# Patient Record
Sex: Female | Born: 1955
Health system: Southern US, Community
[De-identification: ages and names within clinical notes are randomized; demographics above are authoritative.]

## PROBLEM LIST (undated history)

## (undated) DIAGNOSIS — J3089 Other allergic rhinitis: Secondary | ICD-10-CM

## (undated) DIAGNOSIS — R7303 Prediabetes: Secondary | ICD-10-CM

## (undated) DIAGNOSIS — K219 Gastro-esophageal reflux disease without esophagitis: Secondary | ICD-10-CM

## (undated) DIAGNOSIS — Z87442 Personal history of urinary calculi: Secondary | ICD-10-CM

## (undated) DIAGNOSIS — I10 Essential (primary) hypertension: Secondary | ICD-10-CM

## (undated) DIAGNOSIS — J45909 Unspecified asthma, uncomplicated: Secondary | ICD-10-CM

## (undated) DIAGNOSIS — K760 Fatty (change of) liver, not elsewhere classified: Secondary | ICD-10-CM

## (undated) DIAGNOSIS — Z923 Personal history of irradiation: Secondary | ICD-10-CM

## (undated) DIAGNOSIS — D249 Benign neoplasm of unspecified breast: Secondary | ICD-10-CM

## (undated) DIAGNOSIS — T7840XA Allergy, unspecified, initial encounter: Secondary | ICD-10-CM

## (undated) DIAGNOSIS — C801 Malignant (primary) neoplasm, unspecified: Secondary | ICD-10-CM

## (undated) HISTORY — PX: CYST REMOVAL HAND: SHX6279

## (undated) HISTORY — DX: Allergy, unspecified, initial encounter: T78.40XA

---

## 2004-05-03 ENCOUNTER — Ambulatory Visit (HOSPITAL_COMMUNITY): Admission: RE | Admit: 2004-05-03 | Discharge: 2004-05-03 | Payer: Self-pay

## 2007-04-12 HISTORY — PX: COLONOSCOPY: SHX174

## 2007-10-10 ENCOUNTER — Ambulatory Visit (HOSPITAL_COMMUNITY): Admission: RE | Admit: 2007-10-10 | Discharge: 2007-10-10 | Payer: Self-pay | Admitting: Pulmonary Disease

## 2007-10-30 ENCOUNTER — Ambulatory Visit: Payer: Self-pay | Admitting: Gastroenterology

## 2008-02-05 ENCOUNTER — Ambulatory Visit: Payer: Self-pay | Admitting: Gastroenterology

## 2008-02-27 ENCOUNTER — Ambulatory Visit: Payer: Self-pay | Admitting: Gastroenterology

## 2008-02-27 ENCOUNTER — Ambulatory Visit (HOSPITAL_COMMUNITY): Admission: RE | Admit: 2008-02-27 | Discharge: 2008-02-27 | Payer: Self-pay | Admitting: Gastroenterology

## 2008-07-04 ENCOUNTER — Ambulatory Visit: Payer: Self-pay | Admitting: Gastroenterology

## 2008-07-04 DIAGNOSIS — Z91013 Allergy to seafood: Secondary | ICD-10-CM

## 2008-07-04 DIAGNOSIS — R740 Nonspecific elevation of levels of transaminase and lactic acid dehydrogenase [LDH]: Secondary | ICD-10-CM

## 2008-07-15 LAB — CONVERTED CEMR LAB
AST: 25 units/L (ref 0–37)
Albumin: 4.6 g/dL (ref 3.5–5.2)
Alkaline Phosphatase: 97 units/L (ref 39–117)
Indirect Bilirubin: 0.4 mg/dL (ref 0.0–0.9)
Total Bilirubin: 0.5 mg/dL (ref 0.3–1.2)
Total Protein: 7.9 g/dL (ref 6.0–8.3)

## 2009-01-20 DIAGNOSIS — E785 Hyperlipidemia, unspecified: Secondary | ICD-10-CM

## 2009-01-20 DIAGNOSIS — H1045 Other chronic allergic conjunctivitis: Secondary | ICD-10-CM | POA: Insufficient documentation

## 2009-01-20 DIAGNOSIS — Z8639 Personal history of other endocrine, nutritional and metabolic disease: Secondary | ICD-10-CM | POA: Insufficient documentation

## 2009-01-20 DIAGNOSIS — L719 Rosacea, unspecified: Secondary | ICD-10-CM

## 2009-01-20 DIAGNOSIS — I10 Essential (primary) hypertension: Secondary | ICD-10-CM

## 2009-01-20 DIAGNOSIS — O99810 Abnormal glucose complicating pregnancy: Secondary | ICD-10-CM | POA: Insufficient documentation

## 2009-01-20 DIAGNOSIS — Z862 Personal history of diseases of the blood and blood-forming organs and certain disorders involving the immune mechanism: Secondary | ICD-10-CM

## 2010-08-24 NOTE — H&P (Signed)
NAMEONIE, KASPAREK             ACCOUNT NO.:  [id number]   MEDICAL RECORD NO.:  [id number]          PATIENT TYPE:  AMB   LOCATION:  DAY                           FACILITY:  [facility]   PHYSICIAN:  Kassie Mends, M.D.      DATE OF BIRTH:  Mar 25, 1956   DATE OF ADMISSION:  DATE OF DISCHARGE:  LH                              HISTORY & PHYSICAL   REFERRING Tanish Sinkler:  Oneal Deputy. Juanetta Gosling, MD   PROBLEM LIST:  1. Elevated liver enzymes.  2. Hypertension.  3. 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   SUBJECTIVE:  Susan Christensen is a 55 year old female who was initially seen in  July 2009 due to elevated liver enzymes.  At that time, she had been on  Vytorin for 4-5 years.  She had been off Vytorin for 2 months.  Her body  mass index was 29.5.  She had an ultrasound in July 2009, which showed  fatty infiltration of the liver.  The ultrasound was ordered after liver  enzymes revealed an ALT of 60 (0-35) and an AST of 44 (0-37).  Her alk  phos and her total bilirubin were normal.  We repeated her liver enzymes  in July 2009 as well as ferritin, and the liver profile was essentially  unremarkable except for an ALT of 47 (0-35).  Her ferritin was 30, which  is within normal limits.  She did have viral hepatitis serology to  include hepatitis A antibody IgM, hepatic B foreign body IgM, hepatitis  B surface antigen, and hepatitis C antibody, which were negative in July  2009.   She has no particular questions, concerns, or complaints.  Her home  scale reports that she lost 10 pounds.  Our scale is a 3 pounds.  She  denies any itching.  Her appetite is good.  She has not had any episodes  of  yellow eyes or yellow skin.  She has no constipation or abdominal  pain.  She never had a colonoscopy.   FAMILY HISTORY:  No colon cancer or colon polyps.   ALLERGIES:  TRIBULIN and SHELLFISH.   MEDICATIONS AT HOME:  1. Lotrel 10/20.  2. Claritin.  3. Metronidazole cream.  4. ProAir.  5. Albuterol.   PHYSICAL  EXAMINATION:  GENERAL:  Weight 153 pounds, height 5 feet 1  inch, and BMI 28.9 (overweight). VITAL SIGNS:  Temperature 98.5, blood  pressure 120/60, and pulse 92.GENERAL:  She is in no apparent distress.  Alert and oriented x4.HEENT:  Atraumatic and normocephalic.  Pupils are  equal and reactive and light.  Mouth no oral lesions.  Posterior  oropharynx without erythema or exudate.LUNGS:  Clear to auscultation  bilaterally.CARDIOVASCULAR:  Regular rhythm.  No murmur.  Normal S1 and  S2.ABDOMEN:  Bowel sounds are present.  Soft, nontender, and  nondistended.  No rebound or guarding. NEURO:  She has no focal  neurologic deficit.   LABORATORY DATA:  In September 2009, white count 7.7, hemoglobin 13.1,  platelet 383; total bili 0.5, alk phos 83,  AST 27, ALT 43 (0-35), and  albumin 4.5.   ASSESSMENT:  [name] is a  55 year old female who had mildly elevated  liver enzymes.  It was predominantly transaminitis.  The differential  diagnosis includes nonalcoholic steatohepatitis, Vytorin side effect,  and a low likelihood of autoimmune hepatitis. Thank you for allowing me  to see Ms. Gannett in consultation.  My recommendations follow.   RECOMMENDATIONS:  1. She should follow a low-fat diet.  I gave her a handout.  She is      encouraged to lose 10 more pounds, and I believe her liver enzymes      will normalized.  2. She should be scheduled for colonoscopy in November 2009 with a      MiraLax bowel prep.  3. Will obtain quantitive immunoglobulin, ANA, and anti-smooth muscle      antibody today.  4. She has a follow up appointment to see me in March 2010.  Dr.      Juanetta Gosling could consider restarting her a lipid-lowering agent in      another category due to the possibility that Vytorin may have      caused mildly elevated liver enzymes.      Kassie Mends, M.D.  Electronically Signed     SM/MEDQ  D:  02/05/2008  T:  02/05/2008  Job:  [id number]   cc:   Ramon Dredge L. Juanetta Gosling, M.D.  Fax:  [phone]

## 2010-08-24 NOTE — Op Note (Signed)
NAME:  Susan Christensen, Susan Christensen             ACCOUNT NO.:  [id number]   MEDICAL RECORD NO.:  [id number]          PATIENT TYPE:  AMB   LOCATION:  DAY                           FACILITY:  [facility]   PHYSICIAN:  Kassie Mends, M.D.      DATE OF BIRTH:  12-29-1955   DATE OF PROCEDURE:  DATE OF DISCHARGE:                                PROCEDURE NOTE   PROCEDURE:  Colonoscopy.   REFERRING PHYSICIAN:  Edward L. Juanetta Gosling, MD   INDICATION FOR EXAM:  Ms. Oats is a 55 year old female who presents for  average-risk colon cancer screening.   FINDINGS:  Small moderate internal hemorrhoids.  Otherwise, normal colon  without evidence of polyps, mass, inflammatory changes, diverticuli, or  AVMs.   RECOMMENDATIONS:  1. Screening colonoscopy in 10 years.  2. She should follow a high-fiber diet.  She was given a handout on      high-fiber diet and hemorrhoids.   MEDICATIONS:  1. Demerol 75 mg IV.  2. Versed 4 mg IV.   PROCEDURE TECHNIQUE:  Physical exam was performed.  Informed consent was  obtained from the patient after explaining the benefits, risks, and  alternatives to the procedure.  The patient was connected to the monitor  placed in left lateral position.  Continuous oxygen was provided by  nasal cannula and IV medicine administered through an indwelling  cannula.  After administration of sedation and rectal exam, the  patient's rectum was intubated and the scope was advanced under direct  visualization to the cecum.  The scope was removed slowly by carefully  examining the color, texture, anatomy, and integrity of the mucosa on  the way out.  The patient was recovered in endoscopy and discharged home  in satisfactory condition.      Kassie Mends, M.D.  Electronically Signed    SM/MEDQ  D:  02/27/2008  T:  02/27/2008  Job:  [id number]   cc:   Ramon Dredge L. Juanetta Gosling, M.D.  Fax: [phone]

## 2010-08-24 NOTE — Assessment & Plan Note (Signed)
Susan Christensen, Susan Christensen              CHART#:  [id number]   DATE:  10/30/2007                       DOB:  07-02-1955   REASON FOR CONSULTATION:  Abnormal LFTs.   PHYSICIAN REQUESTING CONSULTATION:  Edward L. Juanetta Gosling, MD   HISTORY OF PRESENT ILLNESS:  The patient is a 55 year old Caucasian  female patient of Dr. Kari Baars who presents today for further  evaluation of abnormal LFTs.  The patient states this was initially  discovered about 2 months ago.  Since that time, her Vytorin has been  held.  Her numbers were rechecked recently, but still remained elevated.  Therefore, she has been sent for consultation.  Her last LFTs were on  October 04, 2007.  Her AST was 44.  Her ALT was 60.  Her albumin 4.3, total  bilirubin 0.5, and alkaline phosphatase 83.  She was negative for  hepatitis B surface antigen and hepatitis C antibody.  She had abdominal  ultrasound, which revealed probable fatty liver.  She states that she  has been on Vytorin for over 5 years with no problems with her LFTs.  She denies any weight gain or weight loss from last one year.  She feels  well.  Denies any abdominal pain, nausea, vomiting, heartburn,  dysphagia, odynophagia, constipation, diarrhea, melena, or rectal  bleeding.   CURRENT MEDICATIONS:  1. Lotrel 10/20 mg daily.  2. Claritin 10 mg daily.  3. Metronidazole cream daily.   ALLERGIES:  Shellfish causes anaphylactic reactions and tributalane.   PAST MEDICAL HISTORY:  Seasonal allergies, hypertension, rosacea,  hyperlipidemia, and gestational diabetes.   PAST SURGICAL HISTORY:  Cholecystectomy, cyst removal, and fatty tumor  removed from her shoulder.  No prior colonoscopy or EGD.   FAMILY HISTORY:  Negative for colorectal cancer, chronic GI illnesses,  or liver disease.  She had a sister who passed away with Down syndrome.  Mother has hypertension.  Father deceased at age 76 of MI.   SOCIAL HISTORY:  She is married.  She has 1 child.  She is  unemployed.  She has never been a smoker, rarely consumes alcohol and never a regular  alcohol user.  No history of illicit drug use, tattoos, or blood  transfusions.   REVIEW OF SYSTEMS:  See HPI for GI.  Constitutional:  Denies any recent  weight loss or weight gain in the last one year, but admits that she has  had a gradual weight gain since the birth of her child.  Cardiopulmonary:  Denies chest pain, shortness of breath, palpitations,  or cough.  Genitourinary:  Denies dysuria or hematuria.   PHYSICAL EXAMINATION:  VITAL SIGNS:  Weight 156 and height 5 feet 1  inches.  BMI is 29.5, classified as overweight.  Temp 98.8, blood  pressure 132/88, and pulse 92.  GENERAL:  Pleasant mildly obese Caucasian female in no acute distress.  SKIN:  Warm and dry.  No jaundice.  HEENT:  Sclerae nonicteric.  Oropharyngeal mucosa moist and pink.  No  lesions, erythema, or exudate.  No lymphadenopathy or thyromegaly.  CHEST:  Lungs are clear to auscultation.  CARDIAC:  Regular rate and rhythm.  Normal S1 and S2.  No murmurs, rubs,  or gallops.  ABDOMEN:  Positive bowel sounds.  Abdomen is mildly obese, soft, and  nontender.  No organomegaly or masses.  No rebound  or guarding.  No  abdominal bruits or hernias.  LOWER EXTREMITIES:  No edema.   LABORATORY DATA:  As above.   IMPRESSION:  The patient is a pleasant 55 year old lady who is mildly  obese and has a hyperlipidemia who presents with recent onset of mildly  elevated transaminases.  This was noted in the setting of Vytorin use.  However, it has not improved since medication has been stopped 2 months  ago.  Notably, the patient had been on Vytorin for over 5 years without  any problems.  She denies any weight gain in the last one year.  She has  had some chronic weight gain, however, the past several years.  Ultrasound is suggestive of fatty liver and I suspect she does have  steatohepatitis.  We talked about fatty liver disease today.  I   recommended that she pursue walking 3 and 4 times a week at least 20-30  minutes at a time.  I recommend a 5-10 pound weight loss over the next 8  weeks.  She is also of screening age for colorectal cancer and I have  recommended that she pursue colonoscopy sometime this year.   PLAN:  1. Iron and TIBC, ferritin, and liver function tests.  2. Fatty liver information provided to the patient.  3. Aerobic exercise consisting of 20-30 minutes of walking at least 4      times weekly.  4. A 5-10 pound weight loss over the next 8 weeks.  5. Colonoscopy this year.  The patient will schedule when she is      ready.  6. Further recommendations to follow.  7. The patient did enquire about the possibility of using Omega-3      fatty acids, but has an allergy to shellfish and some other fish,      and I advised her to discuss this with the pharmacist or Dr.      Juanetta Gosling before pursuing.  8. Further recommendations to follow.   I would like to thank Dr. Kari Baars for allowing [place] to take part in  the care of this patient.       Tana Coast, P.A.  Electronically Signed     Kassie Mends, M.D.  Electronically Signed    LL/MEDQ  D:  10/30/2007  T:  10/31/2007  Job:  [id number]   cc:   Ramon Dredge L. Juanetta Gosling, M.D.

## 2013-01-31 ENCOUNTER — Encounter (HOSPITAL_COMMUNITY): Payer: Self-pay | Admitting: Emergency Medicine

## 2013-01-31 ENCOUNTER — Emergency Department (HOSPITAL_COMMUNITY)
Admission: EM | Admit: 2013-01-31 | Discharge: 2013-01-31 | Disposition: A | Discharge status: Home / Self Care | Payer: No Typology Code available for payment source | Attending: Emergency Medicine | Admitting: Emergency Medicine

## 2013-01-31 DIAGNOSIS — Y9389 Activity, other specified: Secondary | ICD-10-CM | POA: Insufficient documentation

## 2013-01-31 DIAGNOSIS — I10 Essential (primary) hypertension: Secondary | ICD-10-CM | POA: Insufficient documentation

## 2013-01-31 DIAGNOSIS — Y92410 Unspecified street and highway as the place of occurrence of the external cause: Secondary | ICD-10-CM | POA: Insufficient documentation

## 2013-01-31 DIAGNOSIS — J45901 Unspecified asthma with (acute) exacerbation: Secondary | ICD-10-CM | POA: Insufficient documentation

## 2013-01-31 DIAGNOSIS — S8990XA Unspecified injury of unspecified lower leg, initial encounter: Secondary | ICD-10-CM | POA: Insufficient documentation

## 2013-01-31 HISTORY — DX: Unspecified asthma, uncomplicated: J45.909

## 2013-01-31 HISTORY — DX: Essential (primary) hypertension: I10

## 2013-01-31 NOTE — Discharge Instructions (Signed)
Tylenol or motrin for pain.  Follow up with your md next week as needed
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
## 2013-01-31 NOTE — ED Notes (Signed)
Pt removed from backboard, denies pain, no point tenderness.

## 2013-01-31 NOTE — ED Notes (Signed)
Pt resting in chair awaiting disposition. Provided with cola per request

## 2014-02-10 ENCOUNTER — Encounter (HOSPITAL_COMMUNITY): Payer: Self-pay | Admitting: Emergency Medicine

## 2015-12-27 ENCOUNTER — Emergency Department (HOSPITAL_COMMUNITY): Payer: BLUE CROSS/BLUE SHIELD

## 2015-12-27 ENCOUNTER — Encounter (HOSPITAL_COMMUNITY): Payer: Self-pay | Admitting: Emergency Medicine

## 2015-12-27 ENCOUNTER — Emergency Department (HOSPITAL_COMMUNITY)
Admission: EM | Admit: 2015-12-27 | Discharge: 2015-12-28 | Disposition: A | Discharge status: Home / Self Care | Payer: BLUE CROSS/BLUE SHIELD | Attending: Emergency Medicine | Admitting: Emergency Medicine

## 2015-12-27 DIAGNOSIS — I10 Essential (primary) hypertension: Secondary | ICD-10-CM | POA: Insufficient documentation

## 2015-12-27 DIAGNOSIS — R0602 Shortness of breath: Secondary | ICD-10-CM | POA: Diagnosis not present

## 2015-12-27 DIAGNOSIS — Z79899 Other long term (current) drug therapy: Secondary | ICD-10-CM | POA: Diagnosis not present

## 2015-12-27 DIAGNOSIS — J45901 Unspecified asthma with (acute) exacerbation: Secondary | ICD-10-CM | POA: Diagnosis not present

## 2015-12-27 DIAGNOSIS — R05 Cough: Secondary | ICD-10-CM | POA: Diagnosis not present

## 2015-12-27 LAB — CBC WITH DIFFERENTIAL/PLATELET
BASOS PCT: 1 %
Basophils Absolute: 0.1 10*3/uL (ref 0.0–0.1)
EOS ABS: 0.6 10*3/uL (ref 0.0–0.7)
Eosinophils Relative: 6 %
HCT: 46.4 % — ABNORMAL HIGH (ref 36.0–46.0)
HEMOGLOBIN: 14.9 g/dL (ref 12.0–15.0)
LYMPHS ABS: 2.5 10*3/uL (ref 0.7–4.0)
Lymphocytes Relative: 25 %
MCH: 29.9 pg (ref 26.0–34.0)
MCHC: 32.1 g/dL (ref 30.0–36.0)
MCV: 93 fL (ref 78.0–100.0)
Monocytes Absolute: 1.2 10*3/uL — ABNORMAL HIGH (ref 0.1–1.0)
Monocytes Relative: 12 %
NEUTROS PCT: 56 %
Neutro Abs: 5.5 10*3/uL (ref 1.7–7.7)
Platelets: 313 10*3/uL (ref 150–400)
RBC: 4.99 MIL/uL (ref 3.87–5.11)
RDW: 12.8 % (ref 11.5–15.5)
WBC: 9.8 10*3/uL (ref 4.0–10.5)

## 2015-12-27 LAB — BASIC METABOLIC PANEL
Anion gap: 12 (ref 5–15)
BUN: 16 mg/dL (ref 6–20)
CHLORIDE: 100 mmol/L — AB (ref 101–111)
CO2: 24 mmol/L (ref 22–32)
CREATININE: 1.02 mg/dL — AB (ref 0.44–1.00)
Calcium: 9.1 mg/dL (ref 8.9–10.3)
GFR calc non Af Amer: 59 mL/min — ABNORMAL LOW (ref >60)
Glucose, Bld: 152 mg/dL — ABNORMAL HIGH (ref 65–99)
Potassium: 3.8 mmol/L (ref 3.5–5.1)
SODIUM: 136 mmol/L (ref 135–145)

## 2015-12-27 MED ORDER — METHYLPREDNISOLONE SODIUM SUCC 125 MG IJ SOLR
125.0000 mg | Freq: Once | INTRAMUSCULAR | Status: AC
  Administered 2015-12-27: 125 mg via INTRAVENOUS
  Filled 2015-12-27: qty 2

## 2015-12-27 MED ORDER — IPRATROPIUM-ALBUTEROL 0.5-2.5 (3) MG/3ML IN SOLN
3.0000 mL | Freq: Once | RESPIRATORY_TRACT | Status: AC
  Administered 2015-12-27: 3 mL via RESPIRATORY_TRACT
  Filled 2015-12-27: qty 3

## 2015-12-27 MED ORDER — ALBUTEROL SULFATE (2.5 MG/3ML) 0.083% IN NEBU
5.0000 mg | INHALATION_SOLUTION | Freq: Once | RESPIRATORY_TRACT | Status: AC
  Administered 2015-12-27: 5 mg via RESPIRATORY_TRACT
  Filled 2015-12-27: qty 6

## 2015-12-27 NOTE — ED Triage Notes (Signed)
Pt states she has been short of breath for past 3 days but more so today. Pt states she has Asthma and has used her inhaler 3 times in last 30 minutes.

## 2015-12-27 NOTE — ED Provider Notes (Signed)
[facility] DEPT Provider Note   CSN: [id number] Arrival date & time: 12/27/15  2211  By signing my name below, I, [name], attest that this documentation has been prepared under the direction and in the presence of Delora Fuel, MD. Electronically Signed: [name], ED Scribe. 12/27/15. 12:03 AM.   History   Chief Complaint Chief Complaint  Patient presents with  . Shortness of Breath   The history is provided by the patient. No language interpreter was used.    HPI Comments: Susan Christensen is a 60 y.o. female with PMHx of Asthma and HTN who presents to the Emergency Department complaining of gradual onset, constant shortness of breath onset 3 days. Pt states she believes she is having a "rough asthma attack". She has used her inhaler at home today with no relief to symptoms. Pt states she is feeling much better after breathing treatment, but still feels as if she is wheezing. Pt has been taking Mucinex for the last few days. Pt had similar symptoms 1 year ago and was prescribed Sudafed which relieved her symptoms. Pt reports associated cough, diaphoresis, chest congestion, wheezing. She states she has never had to been admitted to the hospital for her asthma. Denies chills, nausea, vomiting, fever.   Past Medical History:  Diagnosis Date  . Asthma   . Hypertension     Patient Active Problem List   Diagnosis Date Noted  . HYPERLIPIDEMIA 01/20/2009  . OTHER CHRONIC ALLERGIC CONJUNCTIVITIS 01/20/2009  . HYPERTENSION 01/20/2009  . GESTATIONAL DIABETES 01/20/2009  . ROSACEA 01/20/2009  . LIVER FUNCTION TESTS, ABNORMAL, HX OF 01/20/2009  . ABNORMAL TRANSAMINASE, (LFT'S) 07/04/2008  . [place] ALLERGY 07/04/2008    Past Surgical History:  Procedure Laterality Date  . CESAREAN SECTION    . CYST REMOVAL HAND Left     OB History    Gravida Para Term Preterm AB Living   1 1 1     1    SAB TAB Ectopic Multiple Live Births                   Home Medications     Prior to Admission medications   Medication Sig Start Date End Date Taking? Authorizing Provider  albuterol (VENTOLIN HFA) 108 (90 BASE) MCG/ACT inhaler Inhale 2 puffs into the lungs every 6 (six) hours as needed for wheezing or shortness of breath.   Yes Historical Provider, MD  amLODipine (NORVASC) 10 MG tablet Take 10 mg by mouth daily.   Yes Historical Provider, MD  benazepril (LOTENSIN) 20 MG tablet Take 20 mg by mouth daily.   Yes Historical Provider, MD  cholecalciferol (VITAMIN D) 1000 UNITS tablet Take 1,000 Units by mouth daily.   Yes Historical Provider, MD  Dextromethorphan-Guaifenesin (MUCUS RELIEF DM MAX) 5-100 MG/5ML LIQD Take by mouth every 4 (four) hours as needed (for cold/congestion symptoms).   Yes Historical Provider, MD  Phenylephrine-DM-GG-APAP (EQL MUCUS RELIEF MAX STRENGTH) 5-10-200-325 MG TABS Take 1 tablet by mouth every 12 (twelve) hours as needed (for congestion/cold relief).   Yes Historical Provider, MD    Family History Family History  Problem Relation Age of Onset  . Heart attack Father     Social History Social History  Substance Use Topics  . Smoking status: Never Smoker  . Smokeless tobacco: Never Used  . Alcohol use No     Allergies   Shellfish allergy   Review of Systems Review of Systems  Constitutional: Positive for diaphoresis. Negative for chills and fever.  HENT: Positive for congestion.   Respiratory: Positive for cough, shortness of breath and wheezing.   Gastrointestinal: Negative for nausea and vomiting.  All other systems reviewed and are negative.   Physical Exam Updated Vital Signs BP 155/86   Pulse (!) 127   Temp 98.6 F (37 C) (Oral)   Resp (!) 27   Ht 5\' 1"  (1.549 m)   Wt 157 lb (71.2 kg)   SpO2 96%   BMI 29.66 kg/m   Physical Exam  Constitutional: She is oriented to person, place, and time. She appears well-developed and well-nourished.  HENT:  Head: Normocephalic and atraumatic.  Eyes: EOM are normal.  Pupils are equal, round, and reactive to light.  Neck: Normal range of motion. Neck supple. No JVD present.  Cardiovascular: Regular rhythm and normal heart sounds.  Tachycardia present.   No murmur heard. Pulmonary/Chest: Effort normal. She has wheezes. She has no rales. She exhibits no tenderness.  Lungs diffuse inspiratory and expiratory wheezes  Abdominal: Soft. Bowel sounds are normal. She exhibits no distension and no mass. There is no tenderness.  Musculoskeletal: Normal range of motion. She exhibits no edema.  Trace pretibial edema  Lymphadenopathy:    She has no cervical adenopathy.  Neurological: She is alert and oriented to person, place, and time. No cranial nerve deficit. She exhibits normal muscle tone. Coordination normal.  Skin: Skin is warm and dry. No rash noted.  Psychiatric: She has a normal mood and affect. Her behavior is normal. Judgment and thought content normal.  Nursing note and vitals reviewed.   ED Treatments / Results  DIAGNOSTIC STUDIES: Oxygen Saturation is 92% on RA, low by my interpretation.    COORDINATION OF CARE: 11:16 PM Discussed treatment plan with pt at bedside which includes breathing treatment and steroid and pt agreed to plan.  Labs (all labs ordered are listed, but only abnormal results are displayed) Labs Reviewed  BASIC METABOLIC PANEL - Abnormal; Notable for the following:       Result Value   Chloride 100 (*)    Glucose, Bld 152 (*)    Creatinine, Ser 1.02 (*)    GFR calc non Af Amer 59 (*)    All other components within normal limits  CBC WITH DIFFERENTIAL/PLATELET - Abnormal; Notable for the following:    HCT 46.4 (*)    Monocytes Absolute 1.2 (*)    All other components within normal limits    EKG  EKG Interpretation  Date/Time:  Sunday December 27 2015 22:22:45 EDT Ventricular Rate:  126 PR Interval:    QRS Duration: 139 QT Interval:  314 QTC Calculation: 455 R Axis:   -4 Text Interpretation:  Sinus tachycardia  Left bundle branch block Baseline wander in lead(s) I II III aVR aVL aVF V1 No old tracing to compare Confirmed by [facility]  MD, Madicyn Mesina ([id number]) on 12/27/2015 10:50:32 PM       Radiology Dg Chest 2 View  Result Date: 12/27/2015 CLINICAL DATA:  Subacute onset of shortness of breath and cough. Initial encounter. EXAM: CHEST  2 VIEW COMPARISON:  None. FINDINGS: The lungs are well-aerated. Mild vascular congestion is noted, with mild bibasilar atelectasis. There is no evidence of pleural effusion or pneumothorax. The heart is normal in size; the mediastinal contour is within normal limits. No acute osseous abnormalities are seen. IMPRESSION: Mild vascular congestion, with mild bibasilar atelectasis. Electronically Signed   By: Garald Balding M.D.   On: 12/27/2015 23:45    Procedures Procedures (including critical  care time)  Medications Ordered in ED Medications  albuterol (PROVENTIL) (2.5 MG/3ML) 0.083% nebulizer solution 5 mg (5 mg Nebulization Given 12/27/15 2234)  ipratropium-albuterol (DUONEB) 0.5-2.5 (3) MG/3ML nebulizer solution 3 mL (3 mLs Nebulization Given 12/27/15 2340)  methylPREDNISolone sodium succinate (SOLU-MEDROL) 125 mg/2 mL injection 125 mg (125 mg Intravenous Given 12/27/15 2337)     Initial Impression / Assessment and Plan / ED Course  I have reviewed the triage vital signs and the nursing notes.  Pertinent labs & imaging results that were available during my care of the patient were reviewed by me and considered in my medical decision making (see chart for details).  Clinical Course   Asthma exacerbation. Patient was seen after receiving nebulizer treatment with albuterol which was still having significant wheezing. She was sent for chest x-ray which showed no evidence of pneumonia. She was given a dose of methylprednisolone and given an albuterol with ipratropium nebulizer treatment with complete resolution of wheezing. On reexam, lungs were completely clear and patient stated  she was back to her baseline. She is discharged with prescription for prednisone and is getting to using her albuterol inhaler. Old records are reviewed, and she has no relevant past visits.   Final Clinical Impressions(s) / ED Diagnoses   Final diagnoses:  Asthma exacerbation   I personally performed the services described in this documentation, which was scribed in my presence. The recorded information has been reviewed and is accurate.   New Prescriptions New Prescriptions   PREDNISONE (DELTASONE) 50 MG TABLET    Take 1 tablet (50 mg total) by mouth daily.     Delora Fuel, MD [id number]

## 2015-12-28 MED ORDER — PREDNISONE 50 MG PO TABS: 50.0000 mg | ORAL_TABLET | Freq: Every day | ORAL | 0 refills | Status: DC

## 2015-12-29 ENCOUNTER — Other Ambulatory Visit (HOSPITAL_COMMUNITY): Payer: Self-pay | Admitting: Respiratory Therapy

## 2015-12-29 DIAGNOSIS — J441 Chronic obstructive pulmonary disease with (acute) exacerbation: Secondary | ICD-10-CM

## 2015-12-29 DIAGNOSIS — J45901 Unspecified asthma with (acute) exacerbation: Secondary | ICD-10-CM | POA: Diagnosis not present

## 2016-01-06 ENCOUNTER — Ambulatory Visit (HOSPITAL_COMMUNITY)
Admission: RE | Admit: 2016-01-06 | Discharge: 2016-01-06 | Disposition: A | Discharge status: Home / Self Care | Payer: BLUE CROSS/BLUE SHIELD | Source: Ambulatory Visit | Attending: Pulmonary Disease | Admitting: Pulmonary Disease

## 2016-01-06 DIAGNOSIS — J441 Chronic obstructive pulmonary disease with (acute) exacerbation: Secondary | ICD-10-CM | POA: Insufficient documentation

## 2016-01-06 MED ORDER — ALBUTEROL SULFATE (2.5 MG/3ML) 0.083% IN NEBU
2.5000 mg | INHALATION_SOLUTION | Freq: Once | RESPIRATORY_TRACT | Status: AC
  Administered 2016-01-06: 2.5 mg via RESPIRATORY_TRACT

## 2016-01-11 LAB — PULMONARY FUNCTION TEST
DL/VA % PRED: 121 %
DL/VA: 5.35 ml/min/mmHg/L
DLCO COR % PRED: 104 %
DLCO UNC: 21.98 ml/min/mmHg
DLCO cor: 21.06 ml/min/mmHg
DLCO unc % pred: 108 %
FEF 25-75 POST: 1.22 L/s
FEF 25-75 Pre: 0.82 L/sec
FEF2575-%CHANGE-POST: 48 %
FEF2575-%PRED-POST: 55 %
FEF2575-%Pred-Pre: 37 %
FEV1-%CHANGE-POST: 13 %
FEV1-%PRED-POST: 74 %
FEV1-%Pred-Pre: 65 %
FEV1-Post: 1.7 L
FEV1-Pre: 1.5 L
FEV1FVC-%Change-Post: 7 %
FEV1FVC-%PRED-PRE: 80 %
FEV6-%Change-Post: 6 %
FEV6-%Pred-Post: 88 %
FEV6-%Pred-Pre: 83 %
FEV6-PRE: 2.36 L
FEV6-Post: 2.51 L
FEV6FVC-%Change-Post: 0 %
FEV6FVC-%PRED-PRE: 103 %
FEV6FVC-%Pred-Post: 104 %
FVC-%CHANGE-POST: 5 %
FVC-%PRED-POST: 84 %
FVC-%PRED-PRE: 80 %
FVC-POST: 2.51 L
FVC-PRE: 2.37 L
POST FEV1/FVC RATIO: 68 %
PRE FEV6/FVC RATIO: 100 %
Post FEV6/FVC ratio: 100 %
Pre FEV1/FVC ratio: 63 %
RV % pred: 108 %
RV: 2 L
TLC % pred: 96 %
TLC: 4.43 L

## 2016-02-04 DIAGNOSIS — I10 Essential (primary) hypertension: Secondary | ICD-10-CM | POA: Diagnosis not present

## 2016-02-04 DIAGNOSIS — J45901 Unspecified asthma with (acute) exacerbation: Secondary | ICD-10-CM | POA: Diagnosis not present

## 2016-02-04 DIAGNOSIS — J309 Allergic rhinitis, unspecified: Secondary | ICD-10-CM | POA: Diagnosis not present

## 2016-05-25 DIAGNOSIS — J309 Allergic rhinitis, unspecified: Secondary | ICD-10-CM | POA: Diagnosis not present

## 2016-05-25 DIAGNOSIS — I10 Essential (primary) hypertension: Secondary | ICD-10-CM | POA: Diagnosis not present

## 2016-05-25 DIAGNOSIS — J4530 Mild persistent asthma, uncomplicated: Secondary | ICD-10-CM | POA: Diagnosis not present

## 2016-05-25 DIAGNOSIS — K760 Fatty (change of) liver, not elsewhere classified: Secondary | ICD-10-CM | POA: Diagnosis not present

## 2016-05-26 ENCOUNTER — Other Ambulatory Visit (HOSPITAL_COMMUNITY): Payer: Self-pay | Admitting: Pulmonary Disease

## 2016-05-26 DIAGNOSIS — K760 Fatty (change of) liver, not elsewhere classified: Secondary | ICD-10-CM

## 2016-05-28 DIAGNOSIS — E785 Hyperlipidemia, unspecified: Secondary | ICD-10-CM | POA: Diagnosis not present

## 2016-05-28 DIAGNOSIS — I10 Essential (primary) hypertension: Secondary | ICD-10-CM | POA: Diagnosis not present

## 2016-05-28 DIAGNOSIS — J4530 Mild persistent asthma, uncomplicated: Secondary | ICD-10-CM | POA: Diagnosis not present

## 2016-05-28 DIAGNOSIS — J309 Allergic rhinitis, unspecified: Secondary | ICD-10-CM | POA: Diagnosis not present

## 2016-06-03 ENCOUNTER — Ambulatory Visit (HOSPITAL_COMMUNITY): Payer: No Typology Code available for payment source

## 2016-06-13 ENCOUNTER — Ambulatory Visit (HOSPITAL_COMMUNITY)
Admission: RE | Admit: 2016-06-13 | Discharge: 2016-06-13 | Disposition: A | Discharge status: Home / Self Care | Payer: BLUE CROSS/BLUE SHIELD | Source: Ambulatory Visit | Attending: Pulmonary Disease | Admitting: Pulmonary Disease

## 2016-06-13 DIAGNOSIS — K769 Liver disease, unspecified: Secondary | ICD-10-CM | POA: Diagnosis not present

## 2016-06-13 DIAGNOSIS — K760 Fatty (change of) liver, not elsewhere classified: Secondary | ICD-10-CM | POA: Diagnosis not present

## 2016-06-23 ENCOUNTER — Encounter: Payer: Self-pay | Admitting: Gastroenterology

## 2016-07-15 ENCOUNTER — Ambulatory Visit: Payer: BLUE CROSS/BLUE SHIELD | Admitting: Gastroenterology

## 2016-08-10 ENCOUNTER — Encounter: Payer: Self-pay | Admitting: Gastroenterology

## 2016-08-10 ENCOUNTER — Ambulatory Visit (INDEPENDENT_AMBULATORY_CARE_PROVIDER_SITE_OTHER): Payer: BLUE CROSS/BLUE SHIELD | Admitting: Gastroenterology

## 2016-08-10 DIAGNOSIS — K760 Fatty (change of) liver, not elsewhere classified: Secondary | ICD-10-CM | POA: Diagnosis not present

## 2016-08-10 NOTE — Patient Instructions (Addendum)
Continue your activity as you are doing!  We will recheck liver numbers in 6 months. Let's set a goal for 10 lbs weight loss in 6 months.  Drink 2-3 cups of coffee a day.   Nonalcoholic Fatty Liver Disease Diet Nonalcoholic fatty liver disease is a condition that causes fat to accumulate in and around the liver. The disease makes it harder for the liver to work the way that it should. Following a healthy diet can help to keep nonalcoholic fatty liver disease under control. It can also help to prevent or improve conditions that are associated with the disease, such as heart disease, diabetes, high blood pressure, and abnormal cholesterol levels. Along with regular exercise, this diet:  Promotes weight loss.  Helps to control blood sugar levels.  Helps to improve the way that the body uses insulin. What do I need to know about this diet?  Use the glycemic index (GI) to plan your meals. The index tells you how quickly a food will raise your blood sugar. Choose low-GI foods. These foods take a longer time to raise blood sugar.  Keep track of how many calories you take in. Eating the right amount of calories will help you to achieve a healthy weight.  You may want to follow a Mediterranean diet. This diet includes a lot of vegetables, lean meats or fish, whole grains, fruits, and healthy oils and fats. What foods can I eat? Grains  Whole grains, such as whole-wheat or whole-grain breads, crackers, tortillas, cereals, and pasta. Stone-ground whole wheat. Pumpernickel bread. Unsweetened oatmeal. Bulgur. Barley. Quinoa. Brown or wild rice. Corn or whole-wheat flour tortillas. Vegetables  Lettuce. Spinach. Peas. Beets. Cauliflower. Cabbage. Broccoli. Carrots. Tomatoes. Squash. Eggplant. Herbs. Peppers. Onions. Cucumbers. Brussels sprouts. Yams and sweet potatoes. Beans. Lentils. Fruits  Bananas. Apples. Oranges. Grapes. Papaya. Mango. Pomegranate. Kiwi. Grapefruit. Cherries. Meats and Other  Protein Sources  Seafood and shellfish. Lean meats. Poultry. Tofu. Dairy  Low-fat or fat-free dairy products, such as yogurt, cottage cheese, and cheese. Beverages  Water. Sugar-free drinks. Tea. Coffee. Low-fat or skim milk. Milk alternatives, such as soy or almond milk. Real fruit juice. Condiments  Mustard. Relish. Low-fat, low-sugar ketchup and barbecue sauce. Low-fat or fat-free mayonnaise. Sweets and Desserts  Sugar-free sweets. Fats and Oils  Avocado. Canola or olive oil. Nuts and nut butters. Seeds. The items listed above may not be a complete list of recommended foods or beverages. Contact your dietitian for more options.  What foods are not recommended? Palm oil and coconut oil. Processed foods. Fried foods. Sweetened drinks, such as sweet tea, milkshakes, snow cones, iced sweet drinks, and sodas. Alcohol. Sweets. Foods that contain a lot of salt or sodium. The items listed above may not be a complete list of foods and beverages to avoid. Contact your dietitian for more information.  This information is not intended to replace advice given to you by your health care provider. Make sure you discuss any questions you have with your health care provider. Document Released: 08/12/2014 Document Revised: 09/03/2015 Document Reviewed: 04/22/2014 Elsevier Interactive Patient Education  2017 [employer].

## 2016-08-10 NOTE — Assessment & Plan Note (Signed)
61 year old female with fatty liver, mildly elevated ALT with otherwise normal LFTs. Platelets normal late last year. Hep C negative antibody on file. Discussed dietary and behavior modifications, with a goal of losing 10 lbs by time she is seen again in 6 months. Will continue to follow her closely. Consider elastography if any bump in LFTs, along with serologies. For now, discussed multi-faceted approach for addressing fatty liver. As of note, appreciated a murmur on exam. She is asymptomatic and will mention this to Dr. Luan Pulling at next visit, which she states is upcoming soon. Will see her again in 6 months and recheck LFTs at that time. Colonoscopy in 2019.

## 2016-08-10 NOTE — Progress Notes (Signed)
Primary Care Physician:  Alonza Bogus, MD Primary Gastroenterologist:  Dr. Oneida Alar   Chief Complaint  Patient presents with  . Fatty Liver    HPI:   Susan Christensen is a 61 y.o. female presenting today at the request of Dr. Luan Pulling due to history of fatty liver. US abdomen March 2018 with fatty liver. Outside labs from Feb 2018 with slightly elevated ALT at 48, otherwise LFTs normal.   States she is active. Fitbit 8-10,000 steps a day.   No abdominal pain, no N/V. Mild indigestion every once in awhile after eating spaghetti. No constipation or diarrhea. No rectal bleeding. Colonoscopy due again in 2019.   Past Medical History:  Diagnosis Date  . Asthma   . Hypertension     Past Surgical History:  Procedure Laterality Date  . CESAREAN SECTION    . COLONOSCOPY  2009   Dr. Oneida Alar: normal. Screening in 10 years  . CYST REMOVAL HAND Left     Current Outpatient Prescriptions  Medication Sig Dispense Refill  . albuterol (VENTOLIN HFA) 108 (90 BASE) MCG/ACT inhaler Inhale 2 puffs into the lungs every 6 (six) hours as needed for wheezing or shortness of breath.    Marland Kitchen amLODipine (NORVASC) 10 MG tablet Take 10 mg by mouth daily.    . ARNUITY ELLIPTA 200 MCG/ACT AEPB     . benazepril (LOTENSIN) 20 MG tablet Take 20 mg by mouth daily.    . cholecalciferol (VITAMIN D) 1000 UNITS tablet Take 1,000 Units by mouth daily.    . fluticasone (FLONASE) 50 MCG/ACT nasal spray Place into both nostrils daily.     No current facility-administered medications for this visit.     Allergies as of 08/10/2016 - Review Complete 08/10/2016  Allergen Reaction Noted  . Shellfish allergy Anaphylaxis 01/31/2013    Family History  Problem Relation Age of Onset  . Heart attack Father   . Colon cancer Neg Hx   . Colon polyps Neg Hx     Social History   Social History  . Marital status: Married    Spouse name: N/A  . Number of children: N/A  . Years of education: N/A   Occupational  History  .      [employer]   Social History Main Topics  . Smoking status: Never Smoker  . Smokeless tobacco: Never Used  . Alcohol use No     Comment: rare   . Drug use: No  . Sexual activity: Not on file   Other Topics Concern  . Not on file   Social History Narrative  . No narrative on file    Review of Systems: Gen: Denies any fever, chills, fatigue, weight loss, lack of appetite.  CV: Denies chest pain, heart palpitations, peripheral edema, syncope.  Resp: Denies shortness of breath at rest or with exertion. Denies wheezing or cough.  GI: see HPI  GU : Denies urinary burning, urinary frequency, urinary hesitancy MS: Denies joint pain, muscle weakness, cramps, or limitation of movement.  Derm: Denies rash, itching, dry skin Psych: Denies depression, anxiety, memory loss, and confusion Heme: Denies bruising, bleeding, and enlarged lymph nodes.  Physical Exam: BP 137/78   Pulse 84   Temp 98.3 F (36.8 C) (Oral)   Ht 5\' 1"  (1.549 m)   Wt 157 lb 9.6 oz (71.5 kg)   BMI 29.78 kg/m  General:   Alert and oriented. Pleasant and cooperative. Well-nourished and well-developed.  Head:  Normocephalic and atraumatic. Eyes:  Without  icterus, sclera clear and conjunctiva pink.  Ears:  Normal auditory acuity. Nose:  No deformity, discharge,  or lesions. Mouth:  No deformity or lesions, oral mucosa pink.  Lungs:  Clear to auscultation bilaterally. No wheezes, rales, or rhonchi. No distress.  Heart:  S1, S2 present with systolic murmur noted  Abdomen:  +BS, soft, non-tender and non-distended. No HSM noted. No guarding or rebound. No masses appreciated.  Rectal:  Deferred  Msk:  Symmetrical without gross deformities. Normal posture. Extremities:  Without  edema. Neurologic:  Alert and  oriented x4 Psych:  Alert and cooperative. Normal mood and affect.  Lab Results  Component Value Date   WBC 9.8 12/27/2015   HGB 14.9 12/27/2015   HCT 46.4 (H) 12/27/2015   MCV 93.0  12/27/2015   PLT 313 12/27/2015

## 2016-08-11 ENCOUNTER — Encounter: Payer: Self-pay | Admitting: Gastroenterology

## 2016-08-11 NOTE — Progress Notes (Signed)
CC'ED TO PCP 

## 2016-11-25 NOTE — Progress Notes (Signed)
REVIEWED-NO ADDITIONAL RECOMMENDATIONS. 

## 2017-02-13 ENCOUNTER — Ambulatory Visit: Payer: BLUE CROSS/BLUE SHIELD | Admitting: Gastroenterology

## 2017-03-29 ENCOUNTER — Other Ambulatory Visit: Payer: Self-pay | Admitting: Pulmonary Disease

## 2017-03-29 DIAGNOSIS — I10 Essential (primary) hypertension: Secondary | ICD-10-CM | POA: Diagnosis not present

## 2017-03-29 DIAGNOSIS — K760 Fatty (change of) liver, not elsewhere classified: Secondary | ICD-10-CM | POA: Diagnosis not present

## 2017-03-29 DIAGNOSIS — E785 Hyperlipidemia, unspecified: Secondary | ICD-10-CM | POA: Diagnosis not present

## 2017-03-29 DIAGNOSIS — N6310 Unspecified lump in the right breast, unspecified quadrant: Secondary | ICD-10-CM

## 2017-03-29 DIAGNOSIS — N6341 Unspecified lump in right breast, subareolar: Secondary | ICD-10-CM | POA: Diagnosis not present

## 2017-03-30 ENCOUNTER — Other Ambulatory Visit: Payer: Self-pay | Admitting: Pulmonary Disease

## 2017-03-30 ENCOUNTER — Ambulatory Visit
Admission: RE | Admit: 2017-03-30 | Discharge: 2017-03-30 | Disposition: A | Discharge status: Home / Self Care | Payer: BLUE CROSS/BLUE SHIELD | Source: Ambulatory Visit | Attending: Pulmonary Disease | Admitting: Pulmonary Disease

## 2017-03-30 DIAGNOSIS — N6310 Unspecified lump in the right breast, unspecified quadrant: Secondary | ICD-10-CM

## 2017-03-30 DIAGNOSIS — N6489 Other specified disorders of breast: Secondary | ICD-10-CM | POA: Diagnosis not present

## 2017-03-30 DIAGNOSIS — R928 Other abnormal and inconclusive findings on diagnostic imaging of breast: Secondary | ICD-10-CM | POA: Diagnosis not present

## 2017-04-06 ENCOUNTER — Ambulatory Visit
Admission: RE | Admit: 2017-04-06 | Discharge: 2017-04-06 | Disposition: A | Discharge status: Home / Self Care | Payer: BLUE CROSS/BLUE SHIELD | Source: Ambulatory Visit | Attending: Pulmonary Disease | Admitting: Pulmonary Disease

## 2017-04-06 ENCOUNTER — Other Ambulatory Visit: Payer: Self-pay | Admitting: Pulmonary Disease

## 2017-04-06 DIAGNOSIS — N6310 Unspecified lump in the right breast, unspecified quadrant: Secondary | ICD-10-CM

## 2017-04-06 DIAGNOSIS — N6489 Other specified disorders of breast: Secondary | ICD-10-CM

## 2017-04-06 DIAGNOSIS — N6091 Unspecified benign mammary dysplasia of right breast: Secondary | ICD-10-CM | POA: Diagnosis not present

## 2017-04-06 DIAGNOSIS — D241 Benign neoplasm of right breast: Secondary | ICD-10-CM | POA: Diagnosis not present

## 2017-04-06 DIAGNOSIS — N6314 Unspecified lump in the right breast, lower inner quadrant: Secondary | ICD-10-CM | POA: Diagnosis not present

## 2017-04-11 LAB — HM COLONOSCOPY

## 2017-04-17 ENCOUNTER — Ambulatory Visit: Payer: Self-pay | Admitting: Surgery

## 2017-04-17 DIAGNOSIS — N6310 Unspecified lump in the right breast, unspecified quadrant: Secondary | ICD-10-CM | POA: Diagnosis not present

## 2017-04-17 DIAGNOSIS — D241 Benign neoplasm of right breast: Secondary | ICD-10-CM | POA: Diagnosis not present

## 2017-04-17 NOTE — H&P (Signed)
Susan Christensen Documented: 04/17/2017 10:12 AM Location: [facility] Patient #: [id number] DOB: July 08, 1955 Married / Language: English / Race: White Female  History of Present Illness Susan Moores A. Kush Farabee MD; 04/17/2017 11:31 AM) Patient words: Patient sent at the request of Dr. Dorise Bullion for right breast mass. The patient noted to change her right nipple with a small mass at the tip of it. She underwent evaluation with mammography and ultrasound. There are 2 areas the right breast core biopsy. At 4:00 in the right breast was a papilloma on final pathology. The second area in the right breast upper outer quadrant was found to be fibrocystic change but this was felt to be discordant. Patient denies any history of breast mass in either breast otherwise. The nipple mass and present for few months she thinks without redness or drainage.                        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 This was found to be discordant by Dr. Dorise Bullion, with excision recommended. Pathology revealed INTRADUCTAL PAPILLOMA of the Right breast, 4:00 o'clock. This was found to be concordant by Dr. Dorise Bullion. Pathology results were discussed with the patient's husband, Susan Christensen by telephone, per patient request. Mr. Deats reported his wife did well after the biopsies with tenderness at the sites. Post biopsy instructions and care were reviewed and questions were answered. Mr. Leggio was encouraged to call [facility] for any additional concerns. Surgical consultation has been arranged with Dr. Erroll Luna at [facility] on April 17, 2017. Strongly consider bilateral breast MRI before surgery as the area of distortion may be larger than originally appreciated.  Pathology  results reported by Terie Purser, RN on 04/07/2017.     : Patient presents with a firm discolored right nipple, which she has noted for the last 2 weeks.  EXAM: 2D DIGITAL DIAGNOSTIC BILATERAL MAMMOGRAM WITH CAD AND ADJUNCT TOMO  ULTRASOUND RIGHT BREAST  COMPARISON: None  ACR Breast Density Category b: There are scattered areas of fibroglandular density.  FINDINGS: On the right, the nipple appears enlarged compared to the left. In the lower inner quadrant, there is a small lobulated mass. In the upper outer quadrant, there is a subtle area of architectural distortion.  On the left, there are no discrete masses or areas of architectural distortion.  Both breasts demonstrate scattered benign-appearing calcifications.  Mammographic images were processed with CAD.  On physical exam, the right nipple is firm and masslike, with purplish discoloration. There are no other breast masses.  Targeted ultrasound is performed, showing the nipple is enlarged and heterogeneous, with the appearance of a mass, measuring 11 mm in greatest dimension. There is a questionable small adjacent dermal mass measuring 7 mm. In the lower inner quadrant at 4 o'clock, 3 cm from the nipple, there is a 4 x 3 x 4 mm mass with internal blood flow on color Doppler analysis. Margins are lobulated.  No mass is seen in the upper outer quadrant to correspond to area of apparent architectural distortion.  Sonographic evaluation of the right axilla shows no abnormal or enlarged lymph nodes.  IMPRESSION: 1. Suspicious findings. An apparent mass enlarges and discolors the nipple. There is a small lobulated solid mass at 4 o'clock measuring 4 mm. There is also an area of apparent  architectural distortion in the upper outer right breast.  RECOMMENDATION: 1. Ultrasound-guided core needle biopsy of the 4 o'clock position mass. 2. Stereotactic core needle biopsy of the area of architectural distortion in  the upper outer quadrant of the right breast. 3. Surgical consultation for the nipple mass.  I have discussed the findings and recommendations with the patient. Results were also provided in writing at the conclusion of the visit. If applicable, a reminder letter will be sent to the patient regarding the next appointment.  BI-RADS CATEGORY 4: Suspicious.   Electronically Signed By: Lajean Manes M.D. On: 03/30/2017 09:36             Diagnosis 1. 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 - NO MALIGNANCY IDENTIFIED. 2. Breast, right, needle core biopsy, 4:00 o'clock - INTRADUCTAL PAPILLOMA. Susan Martinique MD Pathologist, [employer].  The patient is a 62 year old female.   Past Surgical History (Susan Christensen, [place]; 04/17/2017 10:12 AM) Breast Biopsy Right. Cesarean Section - 1  Diagnostic Studies History (Susan Christensen, [place]; 04/17/2017 10:12 AM) Colonoscopy 5-10 years ago Mammogram within last year Pap Smear >5 years ago  Allergies (Susan Christensen, [place]; 04/17/2017 10:13 AM) No Known Drug Allergies [04/17/2017]: Allergies Reconciled  Medication History (Susan Christensen, [place]; 04/17/2017 10:14 AM) AmLODIPine Besylate (10MG  Tablet, Oral) Active. Arnuity Ellipta (200MCG/ACT Aero Pow Br Act, Inhalation) Active. Benazepril HCl (20MG  Tablet, Oral) Active. Fluarix Quadrivalent (0.5ML Susp Pref Syr, Intramuscular) Active. Vitamin D (400UNIT Tablet, Oral) Active. Medications Reconciled  Social History (Susan Christensen, [place]; 04/17/2017 10:12 AM) Alcohol use Occasional alcohol use. Caffeine use Carbonated beverages, Coffee, Tea. No drug use Tobacco use Never smoker.  Family History (Susan Christensen, [place]; 04/17/2017 10:12 AM) Arthritis Mother. Breast Cancer Family Members In General. Cerebrovascular Accident  Father. Heart Disease Father. Heart disease in female family member before age 100 Hypertension Brother, Father, Mother. Melanoma Family Members In General. Migraine Headache Daughter.  Pregnancy / Birth History (Susan Christensen, [place]; 04/17/2017 10:12 AM) Age at menarche 50 years. Age of menopause 51-55 Contraceptive History Oral contraceptives. Gravida 1 Length (months) of breastfeeding 3-6 Maternal age 19-40 Para 1 Regular periods  Other Problems (Susan Christensen, [place]; 04/17/2017 10:12 AM) Asthma High blood pressure Hypercholesterolemia Kidney Stone Migraine Headache     Review of Systems (Susan A. Brown RMA; 04/17/2017 10:12 AM) General Not Present- Appetite Loss, Chills, Fatigue, Fever, Night Sweats, Weight Gain and Weight Loss. Skin Not Present- Change in Wart/Mole, Dryness, Hives, Jaundice, New Lesions, Non-Healing Wounds, Rash and Ulcer. HEENT Present- Seasonal Allergies and Wears glasses/contact lenses. Not Present- Earache, Hearing Loss, Hoarseness, Nose Bleed, Oral Ulcers, Ringing in the Ears, Sinus Pain, Sore Throat, Visual Disturbances and Yellow Eyes. Respiratory Not Present- Bloody sputum, Chronic Cough, Difficulty Breathing, Snoring and Wheezing. Breast Present- Breast Mass. Not Present- Breast Pain, Nipple Discharge and Skin Changes. Cardiovascular Not Present- Chest Pain, Difficulty Breathing Lying Down, Leg Cramps, Palpitations, Rapid Heart Rate, Shortness of Breath and Swelling of Extremities. Gastrointestinal Not Present- Abdominal Pain, Bloating, Bloody Stool, Change in Bowel Habits, Chronic diarrhea, Constipation, Difficulty Swallowing, Excessive gas, Gets full quickly at meals, Hemorrhoids, Indigestion, Nausea, Rectal Pain and Vomiting. Female Genitourinary Not Present- Frequency, Nocturia, Painful Urination, Pelvic Pain and Urgency. Musculoskeletal Not Present- Back Pain, Joint Pain, Joint Stiffness, Muscle Pain, Muscle Weakness and Swelling of  Extremities. Neurological Not Present- Decreased Memory, Fainting, Headaches, Numbness,  Seizures, Tingling, Tremor, Trouble walking and Weakness. Psychiatric Not Present- Anxiety, Bipolar, Change in Sleep Pattern, Depression, Fearful and Frequent crying. Endocrine Not Present- Cold Intolerance, Excessive Hunger, Hair Changes, Heat Intolerance, Hot flashes and New Diabetes. Hematology Not Present- Blood Thinners, Easy Bruising, Excessive bleeding, Gland problems, HIV and Persistent Infections.  Vitals (Susan A. Brown RMA; 04/17/2017 10:13 AM) 04/17/2017 10:12 AM Weight: 157.6 lb Height: 61in Body Surface Area: 1.71 m Body Mass Index: 29.78 kg/m  Temp.: 98.43F  Pulse: 75 (Regular)  BP: 148/86 (Sitting, Left Arm, Standard)      Physical Exam (Keshawn Sundberg A. Annelisa Ryback MD; 04/17/2017 11:32 AM)  General Mental Status-Alert. General Appearance-Consistent with stated age. Hydration-Well hydrated. Voice-Normal.  Head and Neck Head-normocephalic, atraumatic with no lesions or palpable masses. Trachea-midline. Thyroid Gland Characteristics - normal size and consistency.  Chest and Lung Exam Chest and lung exam reveals -quiet, even and easy respiratory effort with no use of accessory muscles and on auscultation, normal breath sounds, no adventitious sounds and normal vocal resonance. Inspection Chest Wall - Normal. Back - normal.  Breast Note: ) A mass in the tip of the right nipple. There is bruising the right breast from biopsy. Left breast is normal. There is no nipple discharge bilaterally.  Neurologic Neurologic evaluation reveals -alert and oriented x 3 with no impairment of recent or remote memory. Mental Status-Normal.  Musculoskeletal Normal Exam - Left-Upper Extremity Strength Normal and Lower Extremity Strength Normal. Normal Exam - Right-Upper Extremity Strength Normal and Lower Extremity Strength Normal.  Lymphatic Head & Neck  General Head  & Neck Lymphatics: Bilateral - Description - Normal. Axillary  General Axillary Region: Bilateral - Description - Normal. Tenderness - Non Tender.    Assessment & Plan (Jerrett Baldinger A. Lakeithia Rasor MD; 04/17/2017 11:37 AM)  PAPILLOMA OF RIGHT BREAST (D24.1) Impression: MRI preop  Recommend seed localized lumpectomy of this area for small but present risk of malignancy.    needs biopsy of nipple mass cosmesis of this discussed with the patient   BREAST MASS, RIGHT (N63.10) Impression: Right breast mass upper outer quadrant was found to be hyperplasia with sclerosis and focal microcalcifications. This was felt to be discordant by the radiologist. The right breast 4:00 lesion was a papilloma. We'll obtain an MRI to get better determination of volume to be removed. Recommend lumpectomies for both areas dependent upon MRI. Recommend nipple biopsy for mass at the nipple which could be a sebaceous cyst versus other etiology. Discussed the cosmetic appearance after all this is done. Discussed nonoperative management. She agrees with right breast lumpectomy and biopsy of right nipple. Risk of lumpectomy include bleeding, infection, seroma, more surgery, use of seed/wire, wound care, cosmetic deformity and the need for other treatments, death , blood clots, death. Pt agrees to proceed.  Current Plans You are being scheduled for surgery- Our schedulers will call you.  You should hear from our office's scheduling department within 5 working days about the location, date, and time of surgery. We try to make accommodations for patient's preferences in scheduling surgery, but sometimes the OR schedule or the surgeon's schedule prevents [place] from making those accommodations.  If you have not heard from our office [phone]) in 5 working days, call the office and ask for your surgeon's nurse.  If you have other questions about your diagnosis, plan, or surgery, call the office and ask for your surgeon's  nurse.  Pt Education - Pamphlet Given - Breast Biopsy: discussed with patient and provided information. The anatomy and the  physiology was discussed. The pathophysiology and natural history of the disease was discussed. Options were discussed and recommendations were made. Technique, risks, benefits, & alternatives were discussed. Risks such as stroke,seroma, cosmetic issues, heart attack, bleeding, indection, death, and other risks discussed. Questions answered. The patient agrees to proceed.

## 2017-04-19 ENCOUNTER — Other Ambulatory Visit: Payer: Self-pay | Admitting: Surgery

## 2017-04-19 DIAGNOSIS — D241 Benign neoplasm of right breast: Secondary | ICD-10-CM

## 2017-04-28 ENCOUNTER — Ambulatory Visit
Admission: RE | Admit: 2017-04-28 | Discharge: 2017-04-28 | Disposition: A | Discharge status: Home / Self Care | Payer: BLUE CROSS/BLUE SHIELD | Source: Ambulatory Visit | Attending: Surgery | Admitting: Surgery

## 2017-04-28 DIAGNOSIS — N6310 Unspecified lump in the right breast, unspecified quadrant: Secondary | ICD-10-CM | POA: Diagnosis not present

## 2017-04-28 DIAGNOSIS — D241 Benign neoplasm of right breast: Secondary | ICD-10-CM

## 2017-04-28 MED ORDER — GADOBENATE DIMEGLUMINE 529 MG/ML IV SOLN
15.0000 mL | Freq: Once | INTRAVENOUS | Status: AC | PRN
  Administered 2017-04-28: 15 mL via INTRAVENOUS

## 2017-05-09 ENCOUNTER — Other Ambulatory Visit: Payer: Self-pay | Admitting: Surgery

## 2017-05-09 DIAGNOSIS — N6310 Unspecified lump in the right breast, unspecified quadrant: Secondary | ICD-10-CM

## 2017-05-23 ENCOUNTER — Encounter (HOSPITAL_BASED_OUTPATIENT_CLINIC_OR_DEPARTMENT_OTHER): Payer: Self-pay | Admitting: *Deleted

## 2017-05-23 ENCOUNTER — Other Ambulatory Visit: Payer: Self-pay

## 2017-05-25 ENCOUNTER — Encounter (HOSPITAL_BASED_OUTPATIENT_CLINIC_OR_DEPARTMENT_OTHER)
Admission: RE | Admit: 2017-05-25 | Discharge: 2017-05-25 | Disposition: A | Discharge status: Home / Self Care | Payer: BLUE CROSS/BLUE SHIELD | Source: Ambulatory Visit | Attending: Surgery | Admitting: Surgery

## 2017-05-25 DIAGNOSIS — I10 Essential (primary) hypertension: Secondary | ICD-10-CM | POA: Insufficient documentation

## 2017-05-25 DIAGNOSIS — I447 Left bundle-branch block, unspecified: Secondary | ICD-10-CM | POA: Diagnosis not present

## 2017-05-25 DIAGNOSIS — Z01810 Encounter for preprocedural cardiovascular examination: Secondary | ICD-10-CM | POA: Diagnosis not present

## 2017-05-25 NOTE — Progress Notes (Addendum)
EKG reviewed by Dr. Eligha Bridegroom with Last EKG done 12/27/15, will proceed with surgery as scheduled.  Ensure pre surgery drink given with instructions to complete by 0600 dos, pt verbalized understanding.

## 2017-05-29 ENCOUNTER — Ambulatory Visit
Admission: RE | Admit: 2017-05-29 | Discharge: 2017-05-29 | Disposition: A | Discharge status: Home / Self Care | Payer: BLUE CROSS/BLUE SHIELD | Source: Ambulatory Visit | Attending: Surgery | Admitting: Surgery

## 2017-05-29 ENCOUNTER — Encounter (HOSPITAL_BASED_OUTPATIENT_CLINIC_OR_DEPARTMENT_OTHER): Payer: Self-pay

## 2017-05-29 DIAGNOSIS — N6310 Unspecified lump in the right breast, unspecified quadrant: Secondary | ICD-10-CM

## 2017-05-29 DIAGNOSIS — N6489 Other specified disorders of breast: Secondary | ICD-10-CM | POA: Diagnosis not present

## 2017-05-29 DIAGNOSIS — D241 Benign neoplasm of right breast: Secondary | ICD-10-CM | POA: Diagnosis not present

## 2017-05-29 NOTE — Anesthesia Preprocedure Evaluation (Signed)
Anesthesia Evaluation  Patient identified by MRN, date of birth, ID band Patient awake    Reviewed: Allergy & Precautions, NPO status , Patient's Chart, lab work & pertinent test results  Airway Mallampati: II  TM Distance: >3 FB Neck ROM: Full    Dental no notable dental hx.    Pulmonary asthma ,    Pulmonary exam normal breath sounds clear to auscultation       Cardiovascular hypertension, Pt. on medications Normal cardiovascular exam Rhythm:Regular Rate:Normal     Neuro/Psych negative neurological ROS  negative psych ROS   GI/Hepatic Neg liver ROS,   Endo/Other  negative endocrine ROS  Renal/GU negative Renal ROS  negative genitourinary   Musculoskeletal negative musculoskeletal ROS (+)   Abdominal   Peds negative pediatric ROS (+)  Hematology negative hematology ROS (+)   Anesthesia Other Findings   Reproductive/Obstetrics negative OB ROS                            Anesthesia Physical Anesthesia Plan  ASA: II  Anesthesia Plan: General   Post-op Pain Management:    Induction: Intravenous  PONV Risk Score and Plan: 3 and Ondansetron, Dexamethasone and Treatment may vary due to age or medical condition  Airway Management Planned: Oral ETT and LMA  Additional Equipment:   Intra-op Plan:   Post-operative Plan: Extubation in OR  Informed Consent: I have reviewed the patients History and Physical, chart, labs and discussed the procedure including the risks, benefits and alternatives for the proposed anesthesia with the patient or authorized representative who has indicated his/her understanding and acceptance.     Plan Discussed with: CRNA and Anesthesiologist  Anesthesia Plan Comments: (  )        Anesthesia Quick Evaluation

## 2017-05-30 ENCOUNTER — Ambulatory Visit
Admission: RE | Admit: 2017-05-30 | Discharge: 2017-05-30 | Disposition: A | Discharge status: Home / Self Care | Payer: BLUE CROSS/BLUE SHIELD | Source: Ambulatory Visit | Attending: Surgery | Admitting: Surgery

## 2017-05-30 ENCOUNTER — Other Ambulatory Visit: Payer: Self-pay

## 2017-05-30 ENCOUNTER — Encounter (HOSPITAL_BASED_OUTPATIENT_CLINIC_OR_DEPARTMENT_OTHER)
Admission: RE | Disposition: A | Discharge status: Home / Self Care | Payer: Self-pay | Source: Ambulatory Visit | Attending: Surgery

## 2017-05-30 ENCOUNTER — Ambulatory Visit (HOSPITAL_BASED_OUTPATIENT_CLINIC_OR_DEPARTMENT_OTHER): Payer: BLUE CROSS/BLUE SHIELD | Admitting: Anesthesiology

## 2017-05-30 ENCOUNTER — Encounter (HOSPITAL_BASED_OUTPATIENT_CLINIC_OR_DEPARTMENT_OTHER): Payer: Self-pay | Admitting: Anesthesiology

## 2017-05-30 ENCOUNTER — Ambulatory Visit (HOSPITAL_BASED_OUTPATIENT_CLINIC_OR_DEPARTMENT_OTHER)
Admission: RE | Admit: 2017-05-30 | Discharge: 2017-05-30 | Disposition: A | Discharge status: Home / Self Care | Payer: BLUE CROSS/BLUE SHIELD | Source: Ambulatory Visit | Attending: Surgery | Admitting: Surgery

## 2017-05-30 DIAGNOSIS — N62 Hypertrophy of breast: Secondary | ICD-10-CM | POA: Diagnosis not present

## 2017-05-30 DIAGNOSIS — N6310 Unspecified lump in the right breast, unspecified quadrant: Secondary | ICD-10-CM

## 2017-05-30 DIAGNOSIS — N6311 Unspecified lump in the right breast, upper outer quadrant: Secondary | ICD-10-CM | POA: Diagnosis not present

## 2017-05-30 DIAGNOSIS — J45909 Unspecified asthma, uncomplicated: Secondary | ICD-10-CM | POA: Diagnosis not present

## 2017-05-30 DIAGNOSIS — C50411 Malignant neoplasm of upper-outer quadrant of right female breast: Secondary | ICD-10-CM | POA: Insufficient documentation

## 2017-05-30 DIAGNOSIS — I10 Essential (primary) hypertension: Secondary | ICD-10-CM | POA: Diagnosis not present

## 2017-05-30 DIAGNOSIS — D241 Benign neoplasm of right breast: Secondary | ICD-10-CM | POA: Insufficient documentation

## 2017-05-30 DIAGNOSIS — N6011 Diffuse cystic mastopathy of right breast: Secondary | ICD-10-CM | POA: Diagnosis not present

## 2017-05-30 DIAGNOSIS — E785 Hyperlipidemia, unspecified: Secondary | ICD-10-CM | POA: Diagnosis not present

## 2017-05-30 DIAGNOSIS — N6314 Unspecified lump in the right breast, lower inner quadrant: Secondary | ICD-10-CM | POA: Diagnosis not present

## 2017-05-30 DIAGNOSIS — R928 Other abnormal and inconclusive findings on diagnostic imaging of breast: Secondary | ICD-10-CM | POA: Diagnosis not present

## 2017-05-30 DIAGNOSIS — C50911 Malignant neoplasm of unspecified site of right female breast: Secondary | ICD-10-CM | POA: Diagnosis not present

## 2017-05-30 HISTORY — PX: BREAST BIOPSY: SHX20

## 2017-05-30 HISTORY — DX: Benign neoplasm of unspecified breast: D24.9

## 2017-05-30 HISTORY — DX: Gastro-esophageal reflux disease without esophagitis: K21.9

## 2017-05-30 HISTORY — DX: Other allergic rhinitis: J30.89

## 2017-05-30 HISTORY — PX: BREAST LUMPECTOMY WITH RADIOACTIVE SEED LOCALIZATION: SHX6424

## 2017-05-30 SURGERY — BREAST LUMPECTOMY WITH RADIOACTIVE SEED LOCALIZATION
Anesthesia: General | Site: Breast | Laterality: Right

## 2017-05-30 MED ORDER — EPHEDRINE SULFATE-NACL 50-0.9 MG/10ML-% IV SOSY
PREFILLED_SYRINGE | INTRAVENOUS | Status: DC | PRN
  Administered 2017-05-30 (×2): 5 mg via INTRAVENOUS

## 2017-05-30 MED ORDER — BUPIVACAINE-EPINEPHRINE (PF) 0.25% -1:200000 IJ SOLN
INTRAMUSCULAR | Status: DC | PRN
  Administered 2017-05-30: 20 mL

## 2017-05-30 MED ORDER — CEFAZOLIN SODIUM-DEXTROSE 2-3 GM-%(50ML) IV SOLR
INTRAVENOUS | Status: DC | PRN
  Administered 2017-05-30: 2 g via INTRAVENOUS

## 2017-05-30 MED ORDER — PROPOFOL 10 MG/ML IV BOLUS
INTRAVENOUS | Status: AC
  Filled 2017-05-30: qty 20

## 2017-05-30 MED ORDER — CHLORHEXIDINE GLUCONATE CLOTH 2 % EX PADS: 6.0000 | MEDICATED_PAD | Freq: Once | CUTANEOUS | Status: DC

## 2017-05-30 MED ORDER — SCOPOLAMINE 1 MG/3DAYS TD PT72: 1.0000 | MEDICATED_PATCH | Freq: Once | TRANSDERMAL | Status: DC | PRN

## 2017-05-30 MED ORDER — HYDROCODONE-ACETAMINOPHEN 5-325 MG PO TABS: 1.0000 | ORAL_TABLET | Freq: Four times a day (QID) | ORAL | 0 refills | Status: DC | PRN

## 2017-05-30 MED ORDER — CEFAZOLIN SODIUM-DEXTROSE 2-4 GM/100ML-% IV SOLN: 2.0000 g | INTRAVENOUS | Status: DC

## 2017-05-30 MED ORDER — LACTATED RINGERS IV SOLN
INTRAVENOUS | Status: DC
  Administered 2017-05-30 (×2): via INTRAVENOUS

## 2017-05-30 MED ORDER — CELECOXIB 200 MG PO CAPS
ORAL_CAPSULE | ORAL | Status: AC
  Filled 2017-05-30: qty 1

## 2017-05-30 MED ORDER — FENTANYL CITRATE (PF) 100 MCG/2ML IJ SOLN
INTRAMUSCULAR | Status: AC
  Filled 2017-05-30: qty 2

## 2017-05-30 MED ORDER — CELECOXIB 200 MG PO CAPS
200.0000 mg | ORAL_CAPSULE | ORAL | Status: AC
  Administered 2017-05-30: 200 mg via ORAL

## 2017-05-30 MED ORDER — LIDOCAINE 2% (20 MG/ML) 5 ML SYRINGE
INTRAMUSCULAR | Status: AC
  Filled 2017-05-30: qty 5

## 2017-05-30 MED ORDER — DEXAMETHASONE SODIUM PHOSPHATE 4 MG/ML IJ SOLN
INTRAMUSCULAR | Status: DC | PRN
  Administered 2017-05-30: 10 mg via INTRAVENOUS

## 2017-05-30 MED ORDER — KETOROLAC TROMETHAMINE 30 MG/ML IJ SOLN: 30.0000 mg | Freq: Once | INTRAMUSCULAR | Status: DC | PRN

## 2017-05-30 MED ORDER — OXYCODONE HCL 5 MG/5ML PO SOLN: 5.0000 mg | Freq: Once | ORAL | Status: DC | PRN

## 2017-05-30 MED ORDER — PHENYLEPHRINE 40 MCG/ML (10ML) SYRINGE FOR IV PUSH (FOR BLOOD PRESSURE SUPPORT)
PREFILLED_SYRINGE | INTRAVENOUS | Status: AC
  Filled 2017-05-30: qty 10

## 2017-05-30 MED ORDER — FENTANYL CITRATE (PF) 100 MCG/2ML IJ SOLN: 50.0000 ug | INTRAMUSCULAR | Status: DC | PRN

## 2017-05-30 MED ORDER — DEXAMETHASONE SODIUM PHOSPHATE 10 MG/ML IJ SOLN
INTRAMUSCULAR | Status: AC
  Filled 2017-05-30: qty 1

## 2017-05-30 MED ORDER — CLINDAMYCIN PHOSPHATE 900 MG/50ML IV SOLN
INTRAVENOUS | Status: AC
  Filled 2017-05-30: qty 50

## 2017-05-30 MED ORDER — EPHEDRINE 5 MG/ML INJ
INTRAVENOUS | Status: AC
  Filled 2017-05-30: qty 10

## 2017-05-30 MED ORDER — PHENYLEPHRINE 40 MCG/ML (10ML) SYRINGE FOR IV PUSH (FOR BLOOD PRESSURE SUPPORT)
PREFILLED_SYRINGE | INTRAVENOUS | Status: DC | PRN
  Administered 2017-05-30 (×3): 80 ug via INTRAVENOUS

## 2017-05-30 MED ORDER — LIDOCAINE HCL (CARDIAC) 20 MG/ML IV SOLN
INTRAVENOUS | Status: DC | PRN
  Administered 2017-05-30: 10 mg via INTRAVENOUS

## 2017-05-30 MED ORDER — ACETAMINOPHEN 325 MG PO TABS: 325.0000 mg | ORAL_TABLET | ORAL | Status: DC | PRN

## 2017-05-30 MED ORDER — GABAPENTIN 300 MG PO CAPS
ORAL_CAPSULE | ORAL | Status: AC
  Filled 2017-05-30: qty 1

## 2017-05-30 MED ORDER — MIDAZOLAM HCL 2 MG/2ML IJ SOLN: 1.0000 mg | INTRAMUSCULAR | Status: DC | PRN

## 2017-05-30 MED ORDER — IBUPROFEN 800 MG PO TABS: 800.0000 mg | ORAL_TABLET | Freq: Three times a day (TID) | ORAL | 0 refills | Status: DC | PRN

## 2017-05-30 MED ORDER — OXYCODONE HCL 5 MG PO TABS: 5.0000 mg | ORAL_TABLET | Freq: Once | ORAL | Status: DC | PRN

## 2017-05-30 MED ORDER — FENTANYL CITRATE (PF) 100 MCG/2ML IJ SOLN: 25.0000 ug | INTRAMUSCULAR | Status: DC | PRN

## 2017-05-30 MED ORDER — ONDANSETRON HCL 4 MG/2ML IJ SOLN
INTRAMUSCULAR | Status: AC
  Filled 2017-05-30: qty 2

## 2017-05-30 MED ORDER — FENTANYL CITRATE (PF) 100 MCG/2ML IJ SOLN
INTRAMUSCULAR | Status: DC | PRN
  Administered 2017-05-30 (×2): 25 ug via INTRAVENOUS
  Administered 2017-05-30: 100 ug via INTRAVENOUS

## 2017-05-30 MED ORDER — ACETAMINOPHEN 500 MG PO TABS
ORAL_TABLET | ORAL | Status: AC
  Filled 2017-05-30: qty 2

## 2017-05-30 MED ORDER — GABAPENTIN 300 MG PO CAPS
300.0000 mg | ORAL_CAPSULE | ORAL | Status: AC
  Administered 2017-05-30: 300 mg via ORAL

## 2017-05-30 MED ORDER — ONDANSETRON HCL 4 MG/2ML IJ SOLN: 4.0000 mg | Freq: Once | INTRAMUSCULAR | Status: DC | PRN

## 2017-05-30 MED ORDER — MIDAZOLAM HCL 5 MG/5ML IJ SOLN
INTRAMUSCULAR | Status: DC | PRN
  Administered 2017-05-30: 2 mg via INTRAVENOUS

## 2017-05-30 MED ORDER — ACETAMINOPHEN 500 MG PO TABS
1000.0000 mg | ORAL_TABLET | ORAL | Status: AC
  Administered 2017-05-30: 1000 mg via ORAL

## 2017-05-30 MED ORDER — PROPOFOL 10 MG/ML IV BOLUS
INTRAVENOUS | Status: DC | PRN
  Administered 2017-05-30: 150 mg via INTRAVENOUS

## 2017-05-30 MED ORDER — MEPERIDINE HCL 25 MG/ML IJ SOLN: 6.2500 mg | INTRAMUSCULAR | Status: DC | PRN

## 2017-05-30 MED ORDER — CEFAZOLIN SODIUM-DEXTROSE 2-4 GM/100ML-% IV SOLN
INTRAVENOUS | Status: AC
  Filled 2017-05-30: qty 100

## 2017-05-30 MED ORDER — ACETAMINOPHEN 160 MG/5ML PO SOLN: 325.0000 mg | ORAL | Status: DC | PRN

## 2017-05-30 SURGICAL SUPPLY — 53 items
ADH SKN CLS APL DERMABOND .7 (GAUZE/BANDAGES/DRESSINGS) ×1
APPLIER CLIP 9.375 MED OPEN (MISCELLANEOUS) ×3
APR CLP MED 9.3 20 MLT OPN (MISCELLANEOUS) ×1
BINDER BREAST LRG (GAUZE/BANDAGES/DRESSINGS) ×2 IMPLANT
BINDER BREAST XLRG (GAUZE/BANDAGES/DRESSINGS) IMPLANT
BLADE SURG 15 STRL LF DISP TIS (BLADE) ×1 IMPLANT
BLADE SURG 15 STRL SS (BLADE) ×3
CANISTER SUC SOCK COL 7IN (MISCELLANEOUS) IMPLANT
CANISTER SUCT 1200ML W/VALVE (MISCELLANEOUS) ×3 IMPLANT
CHLORAPREP W/TINT 26ML (MISCELLANEOUS) ×3 IMPLANT
CLIP APPLIE 9.375 MED OPEN (MISCELLANEOUS) IMPLANT
COVER BACK TABLE 60X90IN (DRAPES) ×3 IMPLANT
COVER MAYO STAND STRL (DRAPES) ×3 IMPLANT
COVER PROBE W GEL 5X96 (DRAPES) ×3 IMPLANT
DECANTER SPIKE VIAL GLASS SM (MISCELLANEOUS) ×3 IMPLANT
DERMABOND ADVANCED (GAUZE/BANDAGES/DRESSINGS) ×2
DERMABOND ADVANCED .7 DNX12 (GAUZE/BANDAGES/DRESSINGS) ×1 IMPLANT
DEVICE DUBIN W/COMP PLATE 8390 (MISCELLANEOUS) ×5 IMPLANT
DRAPE LAPAROTOMY 100X72 PEDS (DRAPES) ×3 IMPLANT
DRAPE UTILITY XL STRL (DRAPES) ×3 IMPLANT
ELECT COATED BLADE 2.86 ST (ELECTRODE) ×3 IMPLANT
ELECT REM PT RETURN 9FT ADLT (ELECTROSURGICAL) ×3
ELECTRODE REM PT RTRN 9FT ADLT (ELECTROSURGICAL) ×1 IMPLANT
GLOVE BIO SURGEON STRL SZ 6.5 (GLOVE) ×1 IMPLANT
GLOVE BIO SURGEONS STRL SZ 6.5 (GLOVE) ×1
GLOVE BIOGEL PI IND STRL 7.0 (GLOVE) IMPLANT
GLOVE BIOGEL PI IND STRL 8 (GLOVE) ×1 IMPLANT
GLOVE BIOGEL PI INDICATOR 7.0 (GLOVE) ×2
GLOVE BIOGEL PI INDICATOR 8 (GLOVE) ×2
GLOVE ECLIPSE 8.0 STRL XLNG CF (GLOVE) ×3 IMPLANT
GOWN STRL REUS W/ TWL LRG LVL3 (GOWN DISPOSABLE) ×2 IMPLANT
GOWN STRL REUS W/TWL LRG LVL3 (GOWN DISPOSABLE) ×6
HEMOSTAT ARISTA ABSORB 3G PWDR (MISCELLANEOUS) IMPLANT
HEMOSTAT SNOW SURGICEL 2X4 (HEMOSTASIS) IMPLANT
KIT MARKER MARGIN INK (KITS) ×3 IMPLANT
NDL HYPO 25X1 1.5 SAFETY (NEEDLE) ×1 IMPLANT
NEEDLE HYPO 25X1 1.5 SAFETY (NEEDLE) ×3 IMPLANT
NS IRRIG 1000ML POUR BTL (IV SOLUTION) ×3 IMPLANT
PACK BASIN DAY SURGERY FS (CUSTOM PROCEDURE TRAY) ×3 IMPLANT
PENCIL BUTTON HOLSTER BLD 10FT (ELECTRODE) ×3 IMPLANT
SLEEVE SCD COMPRESS KNEE MED (MISCELLANEOUS) ×3 IMPLANT
SPONGE LAP 4X18 X RAY DECT (DISPOSABLE) ×3 IMPLANT
STAPLER VISISTAT 35W (STAPLE) IMPLANT
SUT MNCRL AB 4-0 PS2 18 (SUTURE) ×3 IMPLANT
SUT MON AB 4-0 PC3 18 (SUTURE) ×5 IMPLANT
SUT SILK 2 0 SH (SUTURE) IMPLANT
SUT VICRYL 3-0 CR8 SH (SUTURE) ×3 IMPLANT
SYR CONTROL 10ML LL (SYRINGE) ×3 IMPLANT
TOWEL OR 17X24 6PK STRL BLUE (TOWEL DISPOSABLE) ×6 IMPLANT
TOWEL OR NON WOVEN STRL DISP B (DISPOSABLE) ×3 IMPLANT
TUBE CONNECTING 20'X1/4 (TUBING) ×1
TUBE CONNECTING 20X1/4 (TUBING) ×2 IMPLANT
YANKAUER SUCT BULB TIP NO VENT (SUCTIONS) ×3 IMPLANT

## 2017-05-30 NOTE — Anesthesia Procedure Notes (Addendum)
Procedure Name: LMA Insertion Date/Time: 05/30/2017 8:45 AM Performed by: Marrianne Mood, CRNA Pre-anesthesia Checklist: Patient identified, Emergency Drugs available, Suction available, Patient being monitored and Timeout performed Patient Re-evaluated:Patient Re-evaluated prior to induction Oxygen Delivery Method: Circle system utilized Preoxygenation: Pre-oxygenation with 100% oxygen Induction Type: IV induction Ventilation: Mask ventilation without difficulty LMA: LMA inserted LMA Size: 3.0 Number of attempts: 1 Airway Equipment and Method: Bite block Placement Confirmation: positive ETCO2 Tube secured with: Tape Dental Injury: Teeth and Oropharynx as per pre-operative assessment

## 2017-05-30 NOTE — Anesthesia Postprocedure Evaluation (Signed)
Anesthesia Post Note  Patient: Susan Christensen  Procedure(s) Performed: RIGHT BREAST LUMPECTOMY WITH RADIOACTIVE SEED LOCALIZATION x2 (Right Breast) RIGHT NIPPLE BIOPSY (Right Breast)     Patient location during evaluation: PACU Anesthesia Type: General Level of consciousness: awake and alert Pain management: pain level controlled Vital Signs Assessment: post-procedure vital signs reviewed and stable Respiratory status: spontaneous breathing, nonlabored ventilation, respiratory function stable and patient connected to nasal cannula oxygen Cardiovascular status: blood pressure returned to baseline and stable Postop Assessment: no apparent nausea or vomiting Anesthetic complications: no    Last Vitals:  Vitals:   05/30/17 1000 05/30/17 1015  BP: (!) 79/46 (!) 79/50  Pulse: 75 72  Resp: 10 10  Temp:    SpO2: 99% 99%    Last Pain:  Vitals:   05/30/17 1015  TempSrc:   PainSc: 0-No pain                 Maelee Hoot

## 2017-05-30 NOTE — Op Note (Signed)
Preoperative diagnosis: Right breast mass upper outer quadrant, lower inner quadrant and nipple  Postoperative diagnoses: Same  Procedure: Right breast seed localized lumpectomy x2 with excisional biopsy of right nipple  Surgeon: Erroll Luna MD  Anesthesia: LMA with local  EBL: 20 cc  Specimens: Right breast mass x2 with seed and clip in both specimens and mass of the tip of the right nipple sent as a separate specimen.  Drains: None  IV fluids: Per anesthesia record  Indications for procedure: The patient presents after routine screening mammogram detected 2 masses in her right breast.  The one is in the right upper outer quadrant the second was in the right lower inner quadrant.  The upper outer quadrant mass was biopsied which showed fibrocystic changes but this was felt to be discordant by the radiologist.  The mass in the lower inner quadrant was found to papilloma.  Of note she had a 1 cm mass in the tip of her nipple which have been growing as well.  She desired excision of all 3 areas.The procedure has been discussed with the patient. Alternatives to surgery have been discussed with the patient.  Risks of surgery include bleeding,  Infection,  Seroma formation, death,  Cosmetic deformity  and the need for further surgery.   The patient understands and wishes to proceed.  Description of procedure: The patient was met in the holding area.  Her films were available for review.  She underwent seed localization as an outpatient.  The right breast was marked as the correct side.  Questions were answered.  She was taken back to the operating room.  After induction of general anesthesia, the right breast was prepped and draped in sterile fashion.  Timeout was done to verify proper patient, procedure and site.  The neoprobe was used.  Procedure marked 1 in the right upper outer quadrant the second in the right lower inner quadrant.  The mass at the tip of the nipple was visualized.  Local  anesthetic was infiltrated around all 3 areas.  Curvilinear incision was made made around the 1 cm.  This was cystic in nature resembled a large papilloma.  This was excised with grossly negative margins.  I was able to close the tip of the nipple with 4-0 Monocryl.  The mass in the right lower inner quadrant was localized again with the neoprobe.  Curvilinear incision was made along the medial border of the nipple areolar complex.  Dissection was carried down and all tissue around the clip was excised.  The seed was removed separately and placed in a separate container.  Radiograph revealed both the clip and mass to be present in the specimen.  These were sent to pathology.  In a similar fashion neoprobe was used to identify the seed in the right breast upper outer quadrant.  A curvilinear incision was made in the right breast upper outer quadrant.  The neoprobe was used to excise all tissue around the seed and clip with grossly negative margins.  Hemostasis was achieved.  Wounds were closed with 3-0 Vicryl and 4-0 Monocryl.  Radiograph revealed the seed and clip to be in the specimen.  This was sent on to pathology.  Dermabond applied to all incisions.  All final counts found to be correct.  Breast binder placed.  The patient was awoke extubated taken recovery in satisfactory condition.

## 2017-05-30 NOTE — Transfer of Care (Signed)
Immediate Anesthesia Transfer of Care Note  Patient: Susan Christensen  Procedure(s) Performed: RIGHT BREAST LUMPECTOMY WITH RADIOACTIVE SEED LOCALIZATION x2 (Right Breast) RIGHT NIPPLE BIOPSY (Right Breast)  Patient Location: PACU  Anesthesia Type:General  Level of Consciousness: sedated  Airway & Oxygen Therapy: Patient Spontanous Breathing and Patient connected to face mask oxygen  Post-op Assessment: Report given to RN and Post -op Vital signs reviewed and stable  Post vital signs: Reviewed and stable  Last Vitals:  Vitals:   05/30/17 0819  BP: (!) 159/78  Pulse: 94  Resp: 20  Temp: 36.8 C  SpO2: 100%    Last Pain:  Vitals:   05/30/17 0819  TempSrc: Oral      Patients Stated Pain Goal: 0 (36/06/77 0340)  Complications: No apparent anesthesia complications

## 2017-05-30 NOTE — H&P (View-Only) (Signed)
Susan Christensen  Location: [facility] Surgery Patient #: [id number] DOB: 12-18-1955 Married / Language: English / Race: White Female  History of Present Illness Marcello Moores A. Milagros Middendorf MD; Patient words: Patient sent at the request of Dr. Dorise Bullion for right breast mass. The patient noted to change her right nipple with a small mass at the tip of it. She underwent evaluation with mammography and ultrasound. There are 2 areas the right breast core biopsy. At 4:00 in the right breast was a papilloma on final pathology. The second area in the right breast upper outer quadrant was found to be fibrocystic change but this was felt to be discordant. Patient denies any history of breast mass in either breast otherwise. The nipple mass and present for few months she thinks without redness or drainage.                        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 This was found to be discordant by Dr. Dorise Bullion, with excision recommended. Pathology revealed INTRADUCTAL PAPILLOMA of the Right breast, 4:00 o'clock. This was found to be concordant by Dr. Dorise Bullion. Pathology results were discussed with the patient's husband, Rebel Laughridge by telephone, per patient request. Mr. Marcon reported his wife did well after the biopsies with tenderness at the sites. Post biopsy instructions and care were reviewed and questions were answered. Mr. Wiers was encouraged to call [facility] for any additional concerns. Surgical consultation has been arranged with Dr. Erroll Luna at [facility] on April 17, 2017. Strongly consider bilateral breast MRI before surgery as the area of distortion may be larger than originally appreciated.  Pathology results reported by  Terie Purser, RN on 04/07/2017.     : Patient presents with a firm discolored right nipple, which she has noted for the last 2 weeks.  EXAM: 2D DIGITAL DIAGNOSTIC BILATERAL MAMMOGRAM WITH CAD AND ADJUNCT TOMO  ULTRASOUND RIGHT BREAST  COMPARISON: None  ACR Breast Density Category b: There are scattered areas of fibroglandular density.  FINDINGS: On the right, the nipple appears enlarged compared to the left. In the lower inner quadrant, there is a small lobulated mass. In the upper outer quadrant, there is a subtle area of architectural distortion.  On the left, there are no discrete masses or areas of architectural distortion.  Both breasts demonstrate scattered benign-appearing calcifications.  Mammographic images were processed with CAD.  On physical exam, the right nipple is firm and masslike, with purplish discoloration. There are no other breast masses.  Targeted ultrasound is performed, showing the nipple is enlarged and heterogeneous, with the appearance of a mass, measuring 11 mm in greatest dimension. There is a questionable small adjacent dermal mass measuring 7 mm. In the lower inner quadrant at 4 o'clock, 3 cm from the nipple, there is a 4 x 3 x 4 mm mass with internal blood flow on color Doppler analysis. Margins are lobulated.  No mass is seen in the upper outer quadrant to correspond to area of apparent architectural distortion.  Sonographic evaluation of the right axilla shows no abnormal or enlarged lymph nodes.  IMPRESSION: 1. Suspicious findings. An apparent mass enlarges and discolors the nipple. There is a small lobulated solid mass at 4 o'clock measuring 4 mm. There is also an area of apparent architectural distortion in the upper outer  right breast.  RECOMMENDATION: 1. Ultrasound-guided core needle biopsy of the 4 o'clock position mass. 2. Stereotactic core needle biopsy of the area of architectural distortion in the  upper outer quadrant of the right breast. 3. Surgical consultation for the nipple mass.  I have discussed the findings and recommendations with the patient. Results were also provided in writing at the conclusion of the visit. If applicable, a reminder letter will be sent to the patient regarding the next appointment.  BI-RADS CATEGORY 4: Suspicious.   Electronically Signed By: Lajean Manes M.D. On: 03/30/2017 09:36             Diagnosis 1. 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 - NO MALIGNANCY IDENTIFIED. 2. Breast, right, needle core biopsy, 4:00 o'clock - INTRADUCTAL PAPILLOMA. Mark Martinique MD Pathologist, [employer].  The patient is a 62 year old female.   Past Surgical History  Breast Biopsy Right. Cesarean Section - 1  Diagnostic Studies History (Tanisha A. Owens Shark, [place]; Colonoscopy 5-10 years ago Mammogram within last year Pap Smear >5 years ago  Allergies (Tanisha A. Owens Shark, [place]; No Known Drug Allergies [04/17/2017]: Allergies Reconciled  Medication History (Tanisha A. Owens Shark, [place]; AmLODIPine Besylate (10MG  Tablet, Oral) Active. Arnuity Ellipta (200MCG/ACT Aero Pow Br Act, Inhalation) Active. Benazepril HCl (20MG  Tablet, Oral) Active. Fluarix Quadrivalent (0.5ML Susp Pref Syr, Intramuscular) Active. Vitamin D (400UNIT Tablet, Oral) Active. Medications Reconciled  Social History (Tanisha A. Brown, RMAAlcohol use Occasional alcohol use. Caffeine use Carbonated beverages, Coffee, Tea. No drug use Tobacco use Never smoker.  Family History (Tanisha A. Owens Shark, [place]; Arthritis Mother. Breast Cancer Family Members In General. Cerebrovascular Accident Father. Heart Disease Father. Heart disease in female family member before age 67 Hypertension Brother, Father,  Mother. Melanoma Family Members In General. Migraine Headache Daughter.  Pregnancy / Birth History (Tanisha A. Owens Shark, [place]; Age at menarche 62 years. Age of menopause 51-55 Contraceptive History Oral contraceptives. Gravida 1 Length (months) of breastfeeding 3-6 Maternal age 58-40 Para 1 Regular periods  Other Problems (Tanisha A. Owens Shark, [place]; Asthma High blood pressure Hypercholesterolemia Kidney Stone Migraine Headache     Review of Systems (Tanisha A. Owens Shark RMA; General Not Present- Appetite Loss, Chills, Fatigue, Fever, Night Sweats, Weight Gain and Weight Loss. Skin Not Present- Change in Wart/Mole, Dryness, Hives, Jaundice, New Lesions, Non-Healing Wounds, Rash and Ulcer. HEENT Present- Seasonal Allergies and Wears glasses/contact lenses. Not Present- Earache, Hearing Loss, Hoarseness, Nose Bleed, Oral Ulcers, Ringing in the Ears, Sinus Pain, Sore Throat, Visual Disturbances and Yellow Eyes. Respiratory Not Present- Bloody sputum, Chronic Cough, Difficulty Breathing, Snoring and Wheezing. Breast Present- Breast Mass. Not Present- Breast Pain, Nipple Discharge and Skin Changes. Cardiovascular Not Present- Chest Pain, Difficulty Breathing Lying Down, Leg Cramps, Palpitations, Rapid Heart Rate, Shortness of Breath and Swelling of Extremities. Gastrointestinal Not Present- Abdominal Pain, Bloating, Bloody Stool, Change in Bowel Habits, Chronic diarrhea, Constipation, Difficulty Swallowing, Excessive gas, Gets full quickly at meals, Hemorrhoids, Indigestion, Nausea, Rectal Pain and Vomiting. Female Genitourinary Not Present- Frequency, Nocturia, Painful Urination, Pelvic Pain and Urgency. Musculoskeletal Not Present- Back Pain, Joint Pain, Joint Stiffness, Muscle Pain, Muscle Weakness and Swelling of Extremities. Neurological Not Present- Decreased Memory, Fainting, Headaches, Numbness, Seizures, Tingling, Tremor, Trouble walking and Weakness. Psychiatric Not  Present- Anxiety, Bipolar, Change in Sleep Pattern, Depression, Fearful and Frequent crying. Endocrine Not Present- Cold Intolerance, Excessive Hunger, Hair Changes, Heat Intolerance, Hot flashes and New Diabetes.  Hematology Not Present- Blood Thinners, Easy Bruising, Excessive bleeding, Gland problems, HIV and Persistent Infections.  Vitals (Tanisha A. Owens Shark RMA;Weight: 157.6 lb Height: 61in Body Surface Area: 1.71 m Body Mass Index: 29.78 kg/m  Temp.: 98.55F  Pulse: 75 (Regular)  BP: 148/86 (Sitting, Left Arm, Standard)      Physical Exam (Obdulio Mash A. Wanell Lorenzi MD; General Mental Status-Alert. General Appearance-Consistent with stated age. Hydration-Well hydrated. Voice-Normal.  Head and Neck Head-normocephalic, atraumatic with no lesions or palpable masses. Trachea-midline. Thyroid Gland Characteristics - normal size and consistency.  Chest and Lung Exam Chest and lung exam reveals -quiet, even and easy respiratory effort with no use of accessory muscles and on auscultation, normal breath sounds, no adventitious sounds and normal vocal resonance. Inspection Chest Wall - Normal. Back - normal.  Breast Note: ) A mass in the tip of the right nipple. There is bruising the right breast from biopsy. Left breast is normal. There is no nipple discharge bilaterally.  Neurologic Neurologic evaluation reveals -alert and oriented x 3 with no impairment of recent or remote memory. Mental Status-Normal.  Musculoskeletal Normal Exam - Left-Upper Extremity Strength Normal and Lower Extremity Strength Normal. Normal Exam - Right-Upper Extremity Strength Normal and Lower Extremity Strength Normal.  Lymphatic Head & Neck  General Head & Neck Lymphatics: Bilateral - Description - Normal. Axillary  General Axillary Region: Bilateral - Description - Normal. Tenderness - Non Tender.    Assessment & Plan (Jaquasia Doscher A. Kale Rondeau  MD;   PAPILLOMA OF RIGHT BREAST (D24.1) Impression: MRI preop  Recommend seed localized lumpectomy of this area for small but present risk of malignancy.    needs biopsy of nipple mass cosmesis of this discussed with the patient   BREAST MASS, RIGHT (N63.10) Impression: Right breast mass upper outer quadrant was found to be hyperplasia with sclerosis and focal microcalcifications. This was felt to be discordant by the radiologist. The right breast 4:00 lesion was a papilloma. We'll obtain an MRI to get better determination of volume to be removed. Recommend lumpectomies for both areas dependent upon MRI. Recommend nipple biopsy for mass at the nipple which could be a sebaceous cyst versus other etiology. Discussed the cosmetic appearance after all this is done. Discussed nonoperative management. She agrees with right breast lumpectomy and biopsy of right nipple. Risk of lumpectomy include bleeding, infection, seroma, more surgery, use of seed/wire, wound care, cosmetic deformity and the need for other treatments, death , blood clots, death. Pt agrees to proceed.  Current Plans You are being scheduled for surgery- Our schedulers will call you.  You should hear from our office's scheduling department within 5 working days about the location, date, and time of surgery. We try to make accommodations for patient's preferences in scheduling surgery, but sometimes the OR schedule or the surgeon's schedule prevents [place] from making those accommodations.  If you have not heard from our office [phone]) in 5 working days, call the office and ask for your surgeon's nurse.  If you have other questions about your diagnosis, plan, or surgery, call the office and ask for your surgeon's nurse.  Pt Education - Pamphlet Given - Breast Biopsy: discussed with patient and provided information. The anatomy and the physiology was discussed. The pathophysiology and natural history of the disease  was discussed. Options were discussed and recommendations were made. Technique, risks, benefits, & alternatives were discussed. Risks such as stroke,seroma, cosmetic issues, heart attack, bleeding, indection, death, and other risks discussed. Questions answered. The patient agrees to proceed.

## 2017-05-30 NOTE — Interval H&P Note (Signed)
History and Physical Interval Note:  05/30/2017 8:28 AM  Susan Christensen  has presented today for surgery, with the diagnosis of RIGHT BREAST PAPILLOMA, RIGHT NIPPLE MASS  The various methods of treatment have been discussed with the patient and family. After consideration of risks, benefits and other options for treatment, the patient has consented to  Procedure(s): RIGHT BREAST LUMPECTOMY WITH RADIOACTIVE SEED LOCALIZATION AND RIGHT NIPPLE MASS BIOPSY (Right) RIGHT NIPPLE BIOPSY (Right) as a surgical intervention .  The patient's history has been reviewed, patient examined, no change in status, stable for surgery.  I have reviewed the patient's chart and labs.  Questions were answered to the patient's satisfaction.     [place]

## 2017-05-30 NOTE — H&P (Signed)
Susan Christensen  Location: [facility] Patient #: [id number] DOB: 07/31/1955 Married / Language: English / Race: White Female  History of Present Illness Marcello Moores A. Riese Hellard MD; Patient words: Patient sent at the request of Dr. Dorise Bullion for right breast mass. The patient noted to change her right nipple with a small mass at the tip of it. She underwent evaluation with mammography and ultrasound. There are 2 areas the right breast core biopsy. At 4:00 in the right breast was a papilloma on final pathology. The second area in the right breast upper outer quadrant was found to be fibrocystic change but this was felt to be discordant. Patient denies any history of breast mass in either breast otherwise. The nipple mass and present for few months she thinks without redness or drainage.                        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 This was found to be discordant by Dr. Dorise Bullion, with excision recommended. Pathology revealed INTRADUCTAL PAPILLOMA of the Right breast, 4:00 o'clock. This was found to be concordant by Dr. Dorise Bullion. Pathology results were discussed with the patient's husband, Susan Christensen by telephone, per patient request. Mr. Rodden reported his wife did well after the biopsies with tenderness at the sites. Post biopsy instructions and care were reviewed and questions were answered. Mr. Kreisler was encouraged to call [facility] for any additional concerns. Surgical consultation has been arranged with Dr. Erroll Luna at [facility] on April 17, 2017. Strongly consider bilateral breast MRI before surgery as the area of distortion may be larger than originally appreciated.  Pathology results reported by  Terie Purser, RN on 04/07/2017.     : Patient presents with a firm discolored right nipple, which she has noted for the last 2 weeks.  EXAM: 2D DIGITAL DIAGNOSTIC BILATERAL MAMMOGRAM WITH CAD AND ADJUNCT TOMO  ULTRASOUND RIGHT BREAST  COMPARISON: None  ACR Breast Density Category b: There are scattered areas of fibroglandular density.  FINDINGS: On the right, the nipple appears enlarged compared to the left. In the lower inner quadrant, there is a small lobulated mass. In the upper outer quadrant, there is a subtle area of architectural distortion.  On the left, there are no discrete masses or areas of architectural distortion.  Both breasts demonstrate scattered benign-appearing calcifications.  Mammographic images were processed with CAD.  On physical exam, the right nipple is firm and masslike, with purplish discoloration. There are no other breast masses.  Targeted ultrasound is performed, showing the nipple is enlarged and heterogeneous, with the appearance of a mass, measuring 11 mm in greatest dimension. There is a questionable small adjacent dermal mass measuring 7 mm. In the lower inner quadrant at 4 o'clock, 3 cm from the nipple, there is a 4 x 3 x 4 mm mass with internal blood flow on color Doppler analysis. Margins are lobulated.  No mass is seen in the upper outer quadrant to correspond to area of apparent architectural distortion.  Sonographic evaluation of the right axilla shows no abnormal or enlarged lymph nodes.  IMPRESSION: 1. Suspicious findings. An apparent mass enlarges and discolors the nipple. There is a small lobulated solid mass at 4 o'clock measuring 4 mm. There is also an area of apparent architectural distortion in the upper outer  right breast.  RECOMMENDATION: 1. Ultrasound-guided core needle biopsy of the 4 o'clock position mass. 2. Stereotactic core needle biopsy of the area of architectural distortion in the  upper outer quadrant of the right breast. 3. Surgical consultation for the nipple mass.  I have discussed the findings and recommendations with the patient. Results were also provided in writing at the conclusion of the visit. If applicable, a reminder letter will be sent to the patient regarding the next appointment.  BI-RADS CATEGORY 4: Suspicious.   Electronically Signed By: Lajean Manes M.D. On: 03/30/2017 09:36             Diagnosis 1. 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 - NO MALIGNANCY IDENTIFIED. 2. Breast, right, needle core biopsy, 4:00 o'clock - INTRADUCTAL PAPILLOMA. Mark Martinique MD Pathologist, [employer].  The patient is a 62 year old female.   Past Surgical History  Breast Biopsy Right. Cesarean Section - 1  Diagnostic Studies History (Tanisha A. Owens Shark, [place]; Colonoscopy 5-10 years ago Mammogram within last year Pap Smear >5 years ago  Allergies (Tanisha A. Owens Shark, [place]; No Known Drug Allergies [04/17/2017]: Allergies Reconciled  Medication History (Tanisha A. Owens Shark, [place]; AmLODIPine Besylate (10MG  Tablet, Oral) Active. Arnuity Ellipta (200MCG/ACT Aero Pow Br Act, Inhalation) Active. Benazepril HCl (20MG  Tablet, Oral) Active. Fluarix Quadrivalent (0.5ML Susp Pref Syr, Intramuscular) Active. Vitamin D (400UNIT Tablet, Oral) Active. Medications Reconciled  Social History (Tanisha A. Brown, RMAAlcohol use Occasional alcohol use. Caffeine use Carbonated beverages, Coffee, Tea. No drug use Tobacco use Never smoker.  Family History (Tanisha A. Owens Shark, [place]; Arthritis Mother. Breast Cancer Family Members In General. Cerebrovascular Accident Father. Heart Disease Father. Heart disease in female family member before age 44 Hypertension Brother, Father,  Mother. Melanoma Family Members In General. Migraine Headache Daughter.  Pregnancy / Birth History (Tanisha A. Owens Shark, [place]; Age at menarche 71 years. Age of menopause 51-55 Contraceptive History Oral contraceptives. Gravida 1 Length (months) of breastfeeding 3-6 Maternal age 72-40 Para 1 Regular periods  Other Problems (Tanisha A. Owens Shark, [place]; Asthma High blood pressure Hypercholesterolemia Kidney Stone Migraine Headache     Review of Systems (Tanisha A. Owens Shark RMA; General Not Present- Appetite Loss, Chills, Fatigue, Fever, Night Sweats, Weight Gain and Weight Loss. Skin Not Present- Change in Wart/Mole, Dryness, Hives, Jaundice, New Lesions, Non-Healing Wounds, Rash and Ulcer. HEENT Present- Seasonal Allergies and Wears glasses/contact lenses. Not Present- Earache, Hearing Loss, Hoarseness, Nose Bleed, Oral Ulcers, Ringing in the Ears, Sinus Pain, Sore Throat, Visual Disturbances and Yellow Eyes. Respiratory Not Present- Bloody sputum, Chronic Cough, Difficulty Breathing, Snoring and Wheezing. Breast Present- Breast Mass. Not Present- Breast Pain, Nipple Discharge and Skin Changes. Cardiovascular Not Present- Chest Pain, Difficulty Breathing Lying Down, Leg Cramps, Palpitations, Rapid Heart Rate, Shortness of Breath and Swelling of Extremities. Gastrointestinal Not Present- Abdominal Pain, Bloating, Bloody Stool, Change in Bowel Habits, Chronic diarrhea, Constipation, Difficulty Swallowing, Excessive gas, Gets full quickly at meals, Hemorrhoids, Indigestion, Nausea, Rectal Pain and Vomiting. Female Genitourinary Not Present- Frequency, Nocturia, Painful Urination, Pelvic Pain and Urgency. Musculoskeletal Not Present- Back Pain, Joint Pain, Joint Stiffness, Muscle Pain, Muscle Weakness and Swelling of Extremities. Neurological Not Present- Decreased Memory, Fainting, Headaches, Numbness, Seizures, Tingling, Tremor, Trouble walking and Weakness. Psychiatric Not  Present- Anxiety, Bipolar, Change in Sleep Pattern, Depression, Fearful and Frequent crying. Endocrine Not Present- Cold Intolerance, Excessive Hunger, Hair Changes, Heat Intolerance, Hot flashes and New Diabetes.  Hematology Not Present- Blood Thinners, Easy Bruising, Excessive bleeding, Gland problems, HIV and Persistent Infections.  Vitals (Tanisha A. Owens Shark RMA;Weight: 157.6 lb Height: 61in Body Surface Area: 1.71 m Body Mass Index: 29.78 kg/m  Temp.: 98.48F  Pulse: 75 (Regular)  BP: 148/86 (Sitting, Left Arm, Standard)      Physical Exam (Cherylynn Liszewski A. Shontez Sermon MD; General Mental Status-Alert. General Appearance-Consistent with stated age. Hydration-Well hydrated. Voice-Normal.  Head and Neck Head-normocephalic, atraumatic with no lesions or palpable masses. Trachea-midline. Thyroid Gland Characteristics - normal size and consistency.  Chest and Lung Exam Chest and lung exam reveals -quiet, even and easy respiratory effort with no use of accessory muscles and on auscultation, normal breath sounds, no adventitious sounds and normal vocal resonance. Inspection Chest Wall - Normal. Back - normal.  Breast Note: ) A mass in the tip of the right nipple. There is bruising the right breast from biopsy. Left breast is normal. There is no nipple discharge bilaterally.  Neurologic Neurologic evaluation reveals -alert and oriented x 3 with no impairment of recent or remote memory. Mental Status-Normal.  Musculoskeletal Normal Exam - Left-Upper Extremity Strength Normal and Lower Extremity Strength Normal. Normal Exam - Right-Upper Extremity Strength Normal and Lower Extremity Strength Normal.  Lymphatic Head & Neck  General Head & Neck Lymphatics: Bilateral - Description - Normal. Axillary  General Axillary Region: Bilateral - Description - Normal. Tenderness - Non Tender.    Assessment & Plan (Garwood Wentzell A. Caswell Alvillar  MD;   PAPILLOMA OF RIGHT BREAST (D24.1) Impression: MRI preop  Recommend seed localized lumpectomy of this area for small but present risk of malignancy.    needs biopsy of nipple mass cosmesis of this discussed with the patient   BREAST MASS, RIGHT (N63.10) Impression: Right breast mass upper outer quadrant was found to be hyperplasia with sclerosis and focal microcalcifications. This was felt to be discordant by the radiologist. The right breast 4:00 lesion was a papilloma. We'll obtain an MRI to get better determination of volume to be removed. Recommend lumpectomies for both areas dependent upon MRI. Recommend nipple biopsy for mass at the nipple which could be a sebaceous cyst versus other etiology. Discussed the cosmetic appearance after all this is done. Discussed nonoperative management. She agrees with right breast lumpectomy and biopsy of right nipple. Risk of lumpectomy include bleeding, infection, seroma, more surgery, use of seed/wire, wound care, cosmetic deformity and the need for other treatments, death , blood clots, death. Pt agrees to proceed.  Current Plans You are being scheduled for surgery- Our schedulers will call you.  You should hear from our office's scheduling department within 5 working days about the location, date, and time of surgery. We try to make accommodations for patient's preferences in scheduling surgery, but sometimes the OR schedule or the surgeon's schedule prevents [place] from making those accommodations.  If you have not heard from our office [phone]) in 5 working days, call the office and ask for your surgeon's nurse.  If you have other questions about your diagnosis, plan, or surgery, call the office and ask for your surgeon's nurse.  Pt Education - Pamphlet Given - Breast Biopsy: discussed with patient and provided information. The anatomy and the physiology was discussed. The pathophysiology and natural history of the disease  was discussed. Options were discussed and recommendations were made. Technique, risks, benefits, & alternatives were discussed. Risks such as stroke,seroma, cosmetic issues, heart attack, bleeding, indection, death, and other risks discussed. Questions answered. The patient agrees to proceed.

## 2017-05-30 NOTE — Discharge Instructions (Signed)
[facility] °Office Phone Number [phone] ° °BREAST BIOPSY/ PARTIAL MASTECTOMY: POST OP INSTRUCTIONS ° °Always review your discharge instruction sheet given to you by the facility where your surgery was performed. ° °IF YOU HAVE DISABILITY OR FAMILY LEAVE FORMS, YOU MUST BRING THEM TO THE OFFICE FOR PROCESSING.  DO NOT GIVE THEM TO YOUR DOCTOR. ° °1. A prescription for pain medication may be given to you upon discharge.  Take your pain medication as prescribed, if needed.  If narcotic pain medicine is not needed, then you may take acetaminophen (Tylenol) or ibuprofen (Advil) as needed. °2. Take your usually prescribed medications unless otherwise directed °3. If you need a refill on your pain medication, please contact your pharmacy.  They will contact our office to request authorization.  Prescriptions will not be filled after 5pm or on week-ends. °4. You should eat very light the first 24 hours after surgery, such as soup, crackers, pudding, etc.  Resume your normal diet the day after surgery. °5. Most patients will experience some swelling and bruising in the breast.  Ice packs and a good support bra will help.  Swelling and bruising can take several days to resolve.  °6. It is common to experience some constipation if taking pain medication after surgery.  Increasing fluid intake and taking a stool softener will usually help or prevent this problem from occurring.  A mild laxative (Milk of Magnesia or Miralax) should be taken according to package directions if there are no bowel movements after 48 hours. °7. Unless discharge instructions indicate otherwise, you may remove your bandages 24-48 hours after surgery, and you may shower at that time.  You may have steri-strips (small skin tapes) in place directly over the incision.  These strips should be left on the skin for 7-10 days.  If your surgeon used skin glue on the incision, you may shower in 24 hours.  The glue will flake off over the  next 2-3 weeks.  Any sutures or staples will be removed at the office during your follow-up visit. °8. ACTIVITIES:  You may resume regular daily activities (gradually increasing) beginning the next day.  Wearing a good support bra or sports bra minimizes pain and swelling.  You may have sexual intercourse when it is comfortable. °a. You may drive when you no longer are taking prescription pain medication, you can comfortably wear a seatbelt, and you can safely maneuver your car and apply brakes. °b. RETURN TO WORK:  ______________________________________________________________________________________ °9. You should see your doctor in the office for a follow-up appointment approximately two weeks after your surgery.  Your doctor’s nurse will typically make your follow-up appointment when she calls you with your pathology report.  Expect your pathology report 2-3 business days after your surgery.  You may call to check if you do not hear from us after three days. °10. OTHER INSTRUCTIONS: _______________________________________________________________________________________________ _____________________________________________________________________________________________________________________________________ °_____________________________________________________________________________________________________________________________________ °_____________________________________________________________________________________________________________________________________ ° °WHEN TO CALL YOUR DOCTOR: °1. Fever over 101.0 °2. Nausea and/or vomiting. °3. Extreme swelling or bruising. °4. Continued bleeding from incision. °5. Increased pain, redness, or drainage from the incision. ° °The clinic staff is available to answer your questions during regular business hours.  Please don’t hesitate to call and ask to speak to one of the nurses for clinical concerns.  If you have a medical emergency, go to the nearest  emergency room or call 911.  A surgeon from [facility] is always on call at the hospital. ° °For further questions, please visit [email or web address]  ° ° ° ° °  Post Anesthesia Home Care Instructions ° °Activity: °Get plenty of rest for the remainder of the day. A responsible individual must stay with you for 24 hours following the procedure.  °For the next 24 hours, DO NOT: °-Drive a car °-Operate machinery °-Drink alcoholic beverages °-Take any medication unless instructed by your physician °-Make any legal decisions or sign important papers. ° °Meals: °Start with liquid foods such as gelatin or soup. Progress to regular foods as tolerated. Avoid greasy, spicy, heavy foods. If nausea and/or vomiting occur, drink only clear liquids until the nausea and/or vomiting subsides. Call your physician if vomiting continues. ° °Special Instructions/Symptoms: °Your throat may feel dry or sore from the anesthesia or the breathing tube placed in your throat during surgery. If this causes discomfort, gargle with warm salt water. The discomfort should disappear within 24 hours. ° °If you had a scopolamine patch placed behind your ear for the management of post- operative nausea and/or vomiting: ° °1. The medication in the patch is effective for 72 hours, after which it should be removed.  Wrap patch in a tissue and discard in the trash. Wash hands thoroughly with soap and water. °2. You may remove the patch earlier than 72 hours if you experience unpleasant side effects which may include dry mouth, dizziness or visual disturbances. °3. Avoid touching the patch. Wash your hands with soap and water after contact with the patch. °  ° °

## 2017-05-31 ENCOUNTER — Encounter (HOSPITAL_BASED_OUTPATIENT_CLINIC_OR_DEPARTMENT_OTHER): Payer: Self-pay | Admitting: Surgery

## 2017-06-02 ENCOUNTER — Ambulatory Visit: Payer: Self-pay | Admitting: Surgery

## 2017-06-02 DIAGNOSIS — Z170 Estrogen receptor positive status [ER+]: Principal | ICD-10-CM

## 2017-06-02 DIAGNOSIS — C50911 Malignant neoplasm of unspecified site of right female breast: Secondary | ICD-10-CM

## 2017-06-05 ENCOUNTER — Ambulatory Visit: Payer: Self-pay | Admitting: Surgery

## 2017-06-07 ENCOUNTER — Encounter: Payer: Self-pay | Admitting: Radiation Oncology

## 2017-06-07 ENCOUNTER — Encounter: Payer: Self-pay | Admitting: Oncology

## 2017-06-13 ENCOUNTER — Other Ambulatory Visit: Payer: Self-pay

## 2017-06-13 ENCOUNTER — Encounter (HOSPITAL_COMMUNITY): Payer: Self-pay | Admitting: *Deleted

## 2017-06-13 MED ORDER — CEFAZOLIN SODIUM-DEXTROSE 2-4 GM/100ML-% IV SOLN
2.0000 g | INTRAVENOUS | Status: AC
  Administered 2017-06-14: 2 g via INTRAVENOUS

## 2017-06-13 MED ORDER — GABAPENTIN 100 MG PO CAPS
300.0000 mg | ORAL_CAPSULE | ORAL | Status: AC
  Administered 2017-06-14: 300 mg via ORAL
  Filled 2017-06-13: qty 3

## 2017-06-13 MED ORDER — ACETAMINOPHEN 500 MG PO TABS
1000.0000 mg | ORAL_TABLET | ORAL | Status: AC
  Administered 2017-06-14: 1000 mg via ORAL
  Filled 2017-06-13: qty 2

## 2017-06-13 MED ORDER — CELECOXIB 100 MG PO CAPS
200.0000 mg | ORAL_CAPSULE | ORAL | Status: AC
  Administered 2017-06-14: 200 mg via ORAL
  Filled 2017-06-13: qty 2

## 2017-06-13 NOTE — Progress Notes (Signed)
Spoke with pt for pre-op call. Pt denies cardiac history, chest pain or sob. States she does not have diabetes.

## 2017-06-14 ENCOUNTER — Encounter (HOSPITAL_COMMUNITY): Payer: Self-pay

## 2017-06-14 ENCOUNTER — Ambulatory Visit (HOSPITAL_COMMUNITY)
Admission: RE | Admit: 2017-06-14 | Discharge: 2017-06-14 | Disposition: A | Discharge status: Home / Self Care | Payer: BLUE CROSS/BLUE SHIELD | Source: Ambulatory Visit | Attending: Surgery | Admitting: Surgery

## 2017-06-14 ENCOUNTER — Ambulatory Visit (HOSPITAL_COMMUNITY): Payer: BLUE CROSS/BLUE SHIELD | Admitting: Anesthesiology

## 2017-06-14 ENCOUNTER — Encounter (HOSPITAL_COMMUNITY)
Admission: RE | Disposition: A | Discharge status: Home / Self Care | Payer: Self-pay | Source: Ambulatory Visit | Attending: Surgery

## 2017-06-14 DIAGNOSIS — E7800 Pure hypercholesterolemia, unspecified: Secondary | ICD-10-CM | POA: Diagnosis not present

## 2017-06-14 DIAGNOSIS — C50411 Malignant neoplasm of upper-outer quadrant of right female breast: Secondary | ICD-10-CM | POA: Diagnosis not present

## 2017-06-14 DIAGNOSIS — I10 Essential (primary) hypertension: Secondary | ICD-10-CM | POA: Insufficient documentation

## 2017-06-14 DIAGNOSIS — Z8261 Family history of arthritis: Secondary | ICD-10-CM | POA: Insufficient documentation

## 2017-06-14 DIAGNOSIS — Z803 Family history of malignant neoplasm of breast: Secondary | ICD-10-CM | POA: Insufficient documentation

## 2017-06-14 DIAGNOSIS — Z8249 Family history of ischemic heart disease and other diseases of the circulatory system: Secondary | ICD-10-CM | POA: Insufficient documentation

## 2017-06-14 DIAGNOSIS — N644 Mastodynia: Secondary | ICD-10-CM | POA: Diagnosis not present

## 2017-06-14 DIAGNOSIS — E785 Hyperlipidemia, unspecified: Secondary | ICD-10-CM | POA: Diagnosis not present

## 2017-06-14 DIAGNOSIS — C50911 Malignant neoplasm of unspecified site of right female breast: Secondary | ICD-10-CM

## 2017-06-14 DIAGNOSIS — Z808 Family history of malignant neoplasm of other organs or systems: Secondary | ICD-10-CM | POA: Insufficient documentation

## 2017-06-14 DIAGNOSIS — Z820 Family history of epilepsy and other diseases of the nervous system: Secondary | ICD-10-CM | POA: Insufficient documentation

## 2017-06-14 DIAGNOSIS — G8918 Other acute postprocedural pain: Secondary | ICD-10-CM | POA: Diagnosis not present

## 2017-06-14 DIAGNOSIS — N6031 Fibrosclerosis of right breast: Secondary | ICD-10-CM | POA: Insufficient documentation

## 2017-06-14 DIAGNOSIS — K219 Gastro-esophageal reflux disease without esophagitis: Secondary | ICD-10-CM | POA: Diagnosis not present

## 2017-06-14 DIAGNOSIS — J45909 Unspecified asthma, uncomplicated: Secondary | ICD-10-CM | POA: Insufficient documentation

## 2017-06-14 DIAGNOSIS — Z170 Estrogen receptor positive status [ER+]: Secondary | ICD-10-CM | POA: Diagnosis not present

## 2017-06-14 HISTORY — DX: Malignant (primary) neoplasm, unspecified: C80.1

## 2017-06-14 HISTORY — DX: Fatty (change of) liver, not elsewhere classified: K76.0

## 2017-06-14 HISTORY — PX: BREAST LUMPECTOMY WITH SENTINEL LYMPH NODE BIOPSY: SHX5597

## 2017-06-14 HISTORY — DX: Personal history of urinary calculi: Z87.442

## 2017-06-14 LAB — CBC
HCT: 47.2 % — ABNORMAL HIGH (ref 36.0–46.0)
Hemoglobin: 15.9 g/dL — ABNORMAL HIGH (ref 12.0–15.0)
MCH: 31.2 pg (ref 26.0–34.0)
MCHC: 33.7 g/dL (ref 30.0–36.0)
MCV: 92.5 fL (ref 78.0–100.0)
Platelets: 308 10*3/uL (ref 150–400)
RBC: 5.1 MIL/uL (ref 3.87–5.11)
RDW: 12.2 % (ref 11.5–15.5)
WBC: 10.5 10*3/uL (ref 4.0–10.5)

## 2017-06-14 LAB — BASIC METABOLIC PANEL
Anion gap: 14 (ref 5–15)
BUN: 10 mg/dL (ref 6–20)
CALCIUM: 9.5 mg/dL (ref 8.9–10.3)
CO2: 20 mmol/L — ABNORMAL LOW (ref 22–32)
CREATININE: 0.84 mg/dL (ref 0.44–1.00)
Chloride: 105 mmol/L (ref 101–111)
GFR calc Af Amer: >60 mL/min (ref >60)
GFR calc non Af Amer: >60 mL/min (ref >60)
GLUCOSE: 125 mg/dL — AB (ref 65–99)
Potassium: 4 mmol/L (ref 3.5–5.1)
Sodium: 139 mmol/L (ref 135–145)

## 2017-06-14 SURGERY — BREAST LUMPECTOMY WITH SENTINEL LYMPH NODE BX
Anesthesia: General | Site: Breast | Laterality: Right

## 2017-06-14 MED ORDER — FENTANYL CITRATE (PF) 250 MCG/5ML IJ SOLN
INTRAMUSCULAR | Status: DC | PRN
  Administered 2017-06-14: 50 ug via INTRAVENOUS
  Administered 2017-06-14: 25 ug via INTRAVENOUS

## 2017-06-14 MED ORDER — BUPIVACAINE-EPINEPHRINE (PF) 0.5% -1:200000 IJ SOLN
INTRAMUSCULAR | Status: DC | PRN
  Administered 2017-06-14: 30 mL

## 2017-06-14 MED ORDER — 0.9 % SODIUM CHLORIDE (POUR BTL) OPTIME
TOPICAL | Status: DC | PRN
  Administered 2017-06-14: 1000 mL

## 2017-06-14 MED ORDER — CHLORHEXIDINE GLUCONATE CLOTH 2 % EX PADS: 6.0000 | MEDICATED_PAD | Freq: Once | CUTANEOUS | Status: DC

## 2017-06-14 MED ORDER — ONDANSETRON HCL 4 MG/2ML IJ SOLN
INTRAMUSCULAR | Status: AC
  Filled 2017-06-14: qty 6

## 2017-06-14 MED ORDER — METHYLENE BLUE 0.5 % INJ SOLN
INTRAVENOUS | Status: AC
  Filled 2017-06-14: qty 10

## 2017-06-14 MED ORDER — HYDROMORPHONE HCL 1 MG/ML IJ SOLN: 0.2500 mg | INTRAMUSCULAR | Status: DC | PRN

## 2017-06-14 MED ORDER — MIDAZOLAM HCL 2 MG/2ML IJ SOLN
2.0000 mg | Freq: Once | INTRAMUSCULAR | Status: AC
  Administered 2017-06-14: 2 mg via INTRAVENOUS

## 2017-06-14 MED ORDER — SODIUM CHLORIDE 0.9 % IJ SOLN
INTRAVENOUS | Status: DC | PRN
  Administered 2017-06-14: 14:00:00

## 2017-06-14 MED ORDER — MIDAZOLAM HCL 2 MG/2ML IJ SOLN
INTRAMUSCULAR | Status: AC
  Administered 2017-06-14: 2 mg via INTRAVENOUS
  Filled 2017-06-14: qty 2

## 2017-06-14 MED ORDER — MIDAZOLAM HCL 5 MG/5ML IJ SOLN
INTRAMUSCULAR | Status: DC | PRN
  Administered 2017-06-14: 1 mg via INTRAVENOUS

## 2017-06-14 MED ORDER — EPHEDRINE 5 MG/ML INJ
INTRAVENOUS | Status: AC
  Filled 2017-06-14: qty 10

## 2017-06-14 MED ORDER — FENTANYL CITRATE (PF) 100 MCG/2ML IJ SOLN
INTRAMUSCULAR | Status: AC
  Administered 2017-06-14: 50 ug via INTRAVENOUS
  Filled 2017-06-14: qty 2

## 2017-06-14 MED ORDER — PROPOFOL 10 MG/ML IV BOLUS
INTRAVENOUS | Status: DC | PRN
  Administered 2017-06-14: 50 mg via INTRAVENOUS
  Administered 2017-06-14: 150 mg via INTRAVENOUS

## 2017-06-14 MED ORDER — FENTANYL CITRATE (PF) 100 MCG/2ML IJ SOLN
100.0000 ug | Freq: Once | INTRAMUSCULAR | Status: AC
  Administered 2017-06-14: 50 ug via INTRAVENOUS

## 2017-06-14 MED ORDER — GABAPENTIN 300 MG PO CAPS
ORAL_CAPSULE | ORAL | Status: AC
  Administered 2017-06-14: 300 mg via ORAL
  Filled 2017-06-14: qty 1

## 2017-06-14 MED ORDER — HEMOSTATIC AGENTS (NO CHARGE) OPTIME
TOPICAL | Status: DC | PRN
  Administered 2017-06-14: 1 via TOPICAL

## 2017-06-14 MED ORDER — DEXAMETHASONE SODIUM PHOSPHATE 10 MG/ML IJ SOLN
INTRAMUSCULAR | Status: AC
  Filled 2017-06-14: qty 3

## 2017-06-14 MED ORDER — DEXAMETHASONE SODIUM PHOSPHATE 10 MG/ML IJ SOLN
INTRAMUSCULAR | Status: DC | PRN
  Administered 2017-06-14: 8 mg via INTRAVENOUS

## 2017-06-14 MED ORDER — TECHNETIUM TC 99M SULFUR COLLOID FILTERED
1.0000 | Freq: Once | INTRAVENOUS | Status: AC | PRN
  Administered 2017-06-14: 1 via INTRADERMAL

## 2017-06-14 MED ORDER — BUPIVACAINE-EPINEPHRINE (PF) 0.5% -1:200000 IJ SOLN
INTRAMUSCULAR | Status: AC
  Filled 2017-06-14: qty 30

## 2017-06-14 MED ORDER — LIDOCAINE 2% (20 MG/ML) 5 ML SYRINGE
INTRAMUSCULAR | Status: DC | PRN
  Administered 2017-06-14: 40 mg via INTRAVENOUS

## 2017-06-14 MED ORDER — MIDAZOLAM HCL 2 MG/2ML IJ SOLN
INTRAMUSCULAR | Status: AC
  Filled 2017-06-14: qty 2

## 2017-06-14 MED ORDER — LACTATED RINGERS IV SOLN
INTRAVENOUS | Status: DC | PRN
  Administered 2017-06-14: 12:00:00 via INTRAVENOUS

## 2017-06-14 MED ORDER — BUPIVACAINE-EPINEPHRINE (PF) 0.5% -1:200000 IJ SOLN
INTRAMUSCULAR | Status: DC | PRN
  Administered 2017-06-14: 10 mL via PERINEURAL

## 2017-06-14 MED ORDER — FENTANYL CITRATE (PF) 250 MCG/5ML IJ SOLN
INTRAMUSCULAR | Status: AC
  Filled 2017-06-14: qty 5

## 2017-06-14 MED ORDER — OXYCODONE HCL 5 MG PO TABS: 5.0000 mg | ORAL_TABLET | Freq: Four times a day (QID) | ORAL | 0 refills | Status: DC | PRN

## 2017-06-14 MED ORDER — CELECOXIB 200 MG PO CAPS
ORAL_CAPSULE | ORAL | Status: AC
  Administered 2017-06-14: 200 mg via ORAL
  Filled 2017-06-14: qty 1

## 2017-06-14 MED ORDER — PHENYLEPHRINE 40 MCG/ML (10ML) SYRINGE FOR IV PUSH (FOR BLOOD PRESSURE SUPPORT)
PREFILLED_SYRINGE | INTRAVENOUS | Status: DC | PRN
  Administered 2017-06-14 (×5): 40 ug via INTRAVENOUS

## 2017-06-14 MED ORDER — ARTIFICIAL TEARS OPHTHALMIC OINT
TOPICAL_OINTMENT | OPHTHALMIC | Status: AC
  Filled 2017-06-14: qty 3.5

## 2017-06-14 MED ORDER — ONDANSETRON HCL 4 MG/2ML IJ SOLN
INTRAMUSCULAR | Status: DC | PRN
  Administered 2017-06-14: 4 mg via INTRAVENOUS

## 2017-06-14 MED ORDER — PROPOFOL 10 MG/ML IV BOLUS
INTRAVENOUS | Status: AC
  Filled 2017-06-14: qty 20

## 2017-06-14 SURGICAL SUPPLY — 55 items
ADH SKN CLS APL DERMABOND .7 (GAUZE/BANDAGES/DRESSINGS) ×1
APPLIER CLIP 9.375 MED OPEN (MISCELLANEOUS) ×3
APR CLP MED 9.3 20 MLT OPN (MISCELLANEOUS) ×1
BINDER BREAST LRG (GAUZE/BANDAGES/DRESSINGS) IMPLANT
BINDER BREAST XLRG (GAUZE/BANDAGES/DRESSINGS) IMPLANT
CANISTER SUCT 3000ML PPV (MISCELLANEOUS) ×3 IMPLANT
CHLORAPREP W/TINT 26ML (MISCELLANEOUS) ×3 IMPLANT
CLIP APPLIE 9.375 MED OPEN (MISCELLANEOUS) IMPLANT
CONT SPEC 4OZ CLIKSEAL STRL BL (MISCELLANEOUS) ×5 IMPLANT
COVER PROBE W GEL 5X96 (DRAPES) ×3 IMPLANT
COVER SURGICAL LIGHT HANDLE (MISCELLANEOUS) ×3 IMPLANT
DERMABOND ADVANCED (GAUZE/BANDAGES/DRESSINGS) ×2
DERMABOND ADVANCED .7 DNX12 (GAUZE/BANDAGES/DRESSINGS) ×1 IMPLANT
DEVICE DUBIN SPECIMEN MAMMOGRA (MISCELLANEOUS) IMPLANT
DRAPE LAPAROSCOPIC ABDOMINAL (DRAPES) ×3 IMPLANT
ELECT CAUTERY BLADE 6.4 (BLADE) ×3 IMPLANT
ELECT REM PT RETURN 9FT ADLT (ELECTROSURGICAL) ×3
ELECTRODE REM PT RTRN 9FT ADLT (ELECTROSURGICAL) ×1 IMPLANT
GLOVE BIO SURGEON STRL SZ8 (GLOVE) ×3 IMPLANT
GLOVE BIOGEL PI IND STRL 8 (GLOVE) ×1 IMPLANT
GLOVE BIOGEL PI INDICATOR 8 (GLOVE) ×4
GLOVE SURG SS PI 8.0 STRL IVOR (GLOVE) ×2 IMPLANT
GOWN STRL REUS W/ TWL LRG LVL3 (GOWN DISPOSABLE) ×2 IMPLANT
GOWN STRL REUS W/ TWL XL LVL3 (GOWN DISPOSABLE) ×1 IMPLANT
GOWN STRL REUS W/TWL LRG LVL3 (GOWN DISPOSABLE) ×6
GOWN STRL REUS W/TWL XL LVL3 (GOWN DISPOSABLE) ×3
HEMOSTAT SNOW SURGICEL 2X4 (HEMOSTASIS) ×2 IMPLANT
KIT BASIN OR (CUSTOM PROCEDURE TRAY) ×3 IMPLANT
KIT MARKER MARGIN INK (KITS) ×2 IMPLANT
KIT ROOM TURNOVER OR (KITS) ×3 IMPLANT
LIGHT WAVEGUIDE WIDE FLAT (MISCELLANEOUS) ×1 IMPLANT
NDL 18GX1X1/2 (RX/OR ONLY) (NEEDLE) ×1 IMPLANT
NDL FILTER BLUNT 18X1 1/2 (NEEDLE) IMPLANT
NDL HYPO 25GX1X1/2 BEV (NEEDLE) ×2 IMPLANT
NEEDLE 18GX1X1/2 (RX/OR ONLY) (NEEDLE) ×3 IMPLANT
NEEDLE FILTER BLUNT 18X 1/2SAF (NEEDLE) ×2
NEEDLE FILTER BLUNT 18X1 1/2 (NEEDLE) ×1 IMPLANT
NEEDLE HYPO 25GX1X1/2 BEV (NEEDLE) ×6 IMPLANT
NS IRRIG 1000ML POUR BTL (IV SOLUTION) ×3 IMPLANT
PACK GENERAL/GYN (CUSTOM PROCEDURE TRAY) ×2 IMPLANT
PACK SURGICAL SETUP 50X90 (CUSTOM PROCEDURE TRAY) ×3 IMPLANT
PAD ARMBOARD 7.5X6 YLW CONV (MISCELLANEOUS) ×4 IMPLANT
PENCIL BUTTON HOLSTER BLD 10FT (ELECTRODE) ×1 IMPLANT
SPONGE LAP 18X18 X RAY DECT (DISPOSABLE) ×1 IMPLANT
SUT MON AB 4-0 PC3 18 (SUTURE) ×4 IMPLANT
SUT SILK 2 0 SH (SUTURE) IMPLANT
SUT VIC AB 3-0 SH 27 (SUTURE) ×6
SUT VIC AB 3-0 SH 27XBRD (SUTURE) ×1 IMPLANT
SYR BULB 3OZ (MISCELLANEOUS) ×1 IMPLANT
SYR CONTROL 10ML LL (SYRINGE) ×6 IMPLANT
TOWEL OR 17X24 6PK STRL BLUE (TOWEL DISPOSABLE) ×3 IMPLANT
TOWEL OR 17X26 10 PK STRL BLUE (TOWEL DISPOSABLE) ×1 IMPLANT
TUBE CONNECTING 12'X1/4 (SUCTIONS)
TUBE CONNECTING 12X1/4 (SUCTIONS) ×1 IMPLANT
YANKAUER SUCT BULB TIP NO VENT (SUCTIONS) ×1 IMPLANT

## 2017-06-14 NOTE — Transfer of Care (Signed)
Immediate Anesthesia Transfer of Care Note  Patient: Susan Christensen  Procedure(s) Performed: RE-EXCISION OF RIGHT BREAST LUMPECTOMY WITH RIGHT SENTINEL LYMPH NODE BX (Right Breast)  Patient Location: PACU  Anesthesia Type:General  Level of Consciousness: awake, alert  and oriented  Airway & Oxygen Therapy: Patient Spontanous Breathing and Patient connected to nasal cannula oxygen  Post-op Assessment: Report given to RN and Post -op Vital signs reviewed and stable  Post vital signs: Reviewed and stable  Last Vitals:  Vitals:   06/14/17 1044 06/14/17 1050  BP: (!) 171/89 (!) 166/70  Resp: 18   Temp: 36.9 C   SpO2: 99%     Last Pain:  Vitals:   06/14/17 1044  TempSrc: Oral      Patients Stated Pain Goal: 1 (14/27/67 0110)  Complications: No apparent anesthesia complications

## 2017-06-14 NOTE — Anesthesia Procedure Notes (Signed)
Anesthesia Regional Block: Pectoralis block   Pre-Anesthetic Checklist: ,, timeout performed, Correct Patient, Correct Site, Correct Laterality, Correct Procedure, Correct Position, site marked, Risks and benefits discussed, pre-op evaluation,  At surgeon's request and post-op pain management  Laterality: Right  Prep: Maximum Sterile Barrier Precautions used, chloraprep       Needles:  Injection technique: Single-shot  Needle Type: Echogenic Stimulator Needle     Needle Length: 9cm  Needle Gauge: 21     Additional Needles:   Procedures:,,,, ultrasound used (permanent image in chart),,,,  Narrative:  Start time: 06/14/2017 11:25 AM End time: 06/14/2017 11:35 AM Injection made incrementally with aspirations every 5 mL. Anesthesiologist: Roderic Palau, MD  Additional Notes: 2% Lidocaine skin wheel.

## 2017-06-14 NOTE — Interval H&P Note (Signed)
History and Physical Interval Note:  06/14/2017 11:21 AM  Susan Christensen  has presented today for surgery, with the diagnosis of RIGHT BREAST CANCER  The various methods of treatment have been discussed with the patient and family. After consideration of risks, benefits and other options for treatment, the patient has consented to  Procedure(s): RE-EXCISION OF RIGHT BREAST LUMPECTOMY WITH RIGHT SENTINEL LYMPH NODE BX (Right) as a surgical intervention .  The patient's history has been reviewed, patient examined, no change in status, stable for surgery.  I have reviewed the patient's chart and labs.  Questions were answered to the patient's satisfaction.    Pt needs re excison right breast lumpectomy and small focus of [facility] Will plan SLN  MAPPING   The procedure has been discussed with the patient. Alternatives to surgery have been discussed with the patient.  Risks of surgery include bleeding,  Infection,  Seroma formation, death,  and the need for further surgery.   The patient understands and wishes to proceed.Sentinel lymph node mapping and dissection has been discussed with the patient.  Risk of bleeding,  Infection,  Seroma formation,  Additional procedures,,  Shoulder weakness ,  Shoulder stiffness,  Nerve and blood vessel injury and reaction to the mapping dyes have been discussed.  Alternatives to surgery have been discussed with the patient.  The patient agrees to proceed.  [place]

## 2017-06-14 NOTE — Anesthesia Preprocedure Evaluation (Addendum)
Anesthesia Evaluation  Patient identified by MRN, date of birth, ID band Patient awake    Reviewed: Allergy & Precautions, H&P , NPO status , Patient's Chart, lab work & pertinent test results  Airway Mallampati: III  TM Distance: >3 FB Neck ROM: Full    Dental no notable dental hx. (+) Teeth Intact, Dental Advisory Given   Pulmonary asthma ,    Pulmonary exam normal breath sounds clear to auscultation       Cardiovascular hypertension, Pt. on medications  Rhythm:Regular Rate:Normal     Neuro/Psych negative neurological ROS  negative psych ROS   GI/Hepatic Neg liver ROS, GERD  Controlled,  Endo/Other  negative endocrine ROS  Renal/GU negative Renal ROS  negative genitourinary   Musculoskeletal   Abdominal   Peds  Hematology negative hematology ROS (+)   Anesthesia Other Findings   Reproductive/Obstetrics negative OB ROS                            Anesthesia Physical Anesthesia Plan  ASA: II  Anesthesia Plan: General   Post-op Pain Management:  Regional for Post-op pain   Induction: Intravenous  PONV Risk Score and Plan: 4 or greater and Ondansetron, Dexamethasone and Midazolam  Airway Management Planned: LMA  Additional Equipment:   Intra-op Plan:   Post-operative Plan: Extubation in OR  Informed Consent: I have reviewed the patients History and Physical, chart, labs and discussed the procedure including the risks, benefits and alternatives for the proposed anesthesia with the patient or authorized representative who has indicated his/her understanding and acceptance.   Dental advisory given  Plan Discussed with: CRNA  Anesthesia Plan Comments:         Anesthesia Quick Evaluation

## 2017-06-14 NOTE — Op Note (Signed)
Preoperative diagnosis: Stage I right breast cancer upper outer quadrant  Postoperative diagnosis: Same  Procedure: Right breast reexcision lumpectomy with right axillary deep  sentinel lymph node mapping using methylene blue dye  Surgeon: Erroll Luna MD  Anesthesia: General with local  EBL: 20 cc  Specimen: Right breast margin to pathology with 1 right axillary sentinel node from level 1 deep axillary node basin  IV fluids: Per record  Indications for procedure: The patient returns after undergoing right breast biopsy for papilloma.  An incidental 0.3 cm focus of invasive lobular carcinoma is identified.  The focal superior margin was positive therefore she returns today for reexcision.  We also discussed sentinel lymph node mapping as well.Sentinel lymph node mapping and dissection has been discussed with the patient.  Risk of bleeding,  Infection,  Seroma formation,  Additional procedures,,  Shoulder weakness ,  Shoulder stiffness,  Nerve and blood vessel injury and reaction to the mapping dyes have been discussed.  Alternatives to surgery have been discussed with the patient.  The patient agrees to proceed.The procedure has been discussed with the patient. Alternatives to surgery have been discussed with the patient.  Risks of surgery include bleeding,  Infection,  Seroma formation, death,  and the need for further surgery.   The patient understands and wishes to proceed.    Description of procedure: The patient was met in the holding area and questions were answered.  The right side was marked as correct.  She was taken back to the operating room after injection of the right breast by nuclear medicine.  She was placed supine on the OR table.  After induction of general anesthesia, the right breast was prepped and draped in sterile fashion.  Timeout was done and she received preoperative antibiotics.  4 cc of methylene blue dye were injected into the right nipple and the breast was  massaged for 5 minutes.  The previous lumpectomy incision right breast upper outer quadrant was opened.  The superior margin was excised with a grossly negative margin.  The seroma cavity was irrigated out and evacuated.  Wound was closed with 3-0 Vicryl and 4-0 Monocryl.  At the neoprobe was used.  The hot spot in the right axilla was identified and marked.  A 3 cm incision was made in the right axillary's inferior hairline.  Dissection was carried down into the level 1 contents.  A single blue and hot sentinel nodes identified and removed.  Background counts approaches 0.  Hemostasis achieved with cautery 3-0 Vicryl and Surgicel snow.  The deep layers were closed with 3-0 Vicryl.  4-0 Monocryl was used to close both skin incisions.  All final counts were found to be correct.  Dermabond applied.  Breast binder placed.  Patient was then awoke extubated taken recovery in satisfactory condition.

## 2017-06-14 NOTE — Progress Notes (Signed)
Location of Breast Cancer: Right Breast  Histology per Pathology Report:  04/06/17 Diagnosis 1. 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 - NO MALIGNANCY IDENTIFIED. 2. Breast, right, needle core biopsy, 4:00 o'clock - INTRADUCTAL PAPILLOMA  05/30/17 Diagnosis 1. Nipple Biopsy, Right - INTRADUCTAL PAPILLOMA. - THERE IS NO EVIDENCE OF MALIGNANCY. - SEE COMMENT. 2. Breast, lumpectomy, Right Medial Tissue - INTRADUCTAL PAPILLOMA. - FIBROCYSTIC CHANGES. - USUAL DUCTAL HYPERPLASIA. - HEALING BIOPSY SITE. - THERE IS NO EVIDENCE OF MALIGNANCY. - SEE COMMENT. 3. Breast, lumpectomy, Right Superior Tissue - INVASIVE LOBULAR CARCINOMA, GRADE II/III, SPANNING 0.3 CM. - LOBULAR CARCINOMA IN SITU. - INVASIVE CARCINOMA IS FOCALLY PRESENT AT THE SUPERIOR MARGIN. - SEE ONCOLOGY TABLE BELOW.  06/14/17 Diagnosis 1. Breast, excision, Right Margin - BENIGN BREAST TISSUE WITH BIOPSY-RELATED CHANGES - NEGATIVE FOR IN SITU OR INVASIVE CARCINOMA - SEE NOTE 2. Lymph node, sentinel, biopsy, Right Axillary - LYMPH NODE, NEGATIVE FOR CARCINOMA (0/1)   Did patient present with symptoms or was this found on screening mammography?: She noticed a change to her Right nipple and presented to her physician.   Past/Anticipated interventions by surgeon, if any: 05/30/17 Procedure: Right breast seed localized lumpectomy x2 with excisional biopsy of right nipple Surgeon: Erroll Luna MD  06/14/17 Procedure: Right breast reexcision lumpectomy with right axillary deep  sentinel lymph node mapping using methylene blue dye Surgeon: Erroll Luna MD   Past/Anticipated interventions by medical oncology, if any:  06/20/17 Dr. Jana Hakim at 3:30  Lymphedema issues, if any:  She has some swelling to her Right Axilla area.   Pain issues, if any:  She reports pain to her right axillary area  where her lymph nodes were removed.   SAFETY ISSUES:  Prior radiation? No  Pacemaker/ICD? No  Possible current pregnancy? No  Is the patient on methotrexate? No  Current Complaints / other details:   BP 138/71   Pulse 91   Temp 99 F (37.2 C)   Ht 5\' 1"  (1.549 m)   Wt 153 lb 9.6 oz (69.7 kg)   SpO2 98% Comment: room air  BMI 29.02 kg/m    Wt Readings from Last 3 Encounters:  06/20/17 153 lb 9.6 oz (69.7 kg)  06/14/17 155 lb (70.3 kg)  05/30/17 157 lb (71.2 kg)      Susan Christensen, [name], RN 06/14/2017,3:15 PM

## 2017-06-14 NOTE — Discharge Instructions (Signed)
[facility] °Office Phone Number [phone] ° °BREAST BIOPSY/ PARTIAL MASTECTOMY: POST OP INSTRUCTIONS ° °Always review your discharge instruction sheet given to you by the facility where your surgery was performed. ° °IF YOU HAVE DISABILITY OR FAMILY LEAVE FORMS, YOU MUST BRING THEM TO THE OFFICE FOR PROCESSING.  DO NOT GIVE THEM TO YOUR DOCTOR. ° °1. A prescription for pain medication may be given to you upon discharge.  Take your pain medication as prescribed, if needed.  If narcotic pain medicine is not needed, then you may take acetaminophen (Tylenol) or ibuprofen (Advil) as needed. °2. Take your usually prescribed medications unless otherwise directed °3. If you need a refill on your pain medication, please contact your pharmacy.  They will contact our office to request authorization.  Prescriptions will not be filled after 5pm or on week-ends. °4. You should eat very light the first 24 hours after surgery, such as soup, crackers, pudding, etc.  Resume your normal diet the day after surgery. °5. Most patients will experience some swelling and bruising in the breast.  Ice packs and a good support bra will help.  Swelling and bruising can take several days to resolve.  °6. It is common to experience some constipation if taking pain medication after surgery.  Increasing fluid intake and taking a stool softener will usually help or prevent this problem from occurring.  A mild laxative (Milk of Magnesia or Miralax) should be taken according to package directions if there are no bowel movements after 48 hours. °7. Unless discharge instructions indicate otherwise, you may remove your bandages 24-48 hours after surgery, and you may shower at that time.  You may have steri-strips (small skin tapes) in place directly over the incision.  These strips should be left on the skin for 7-10 days.  If your surgeon used skin glue on the incision, you may shower in 24 hours.  The glue will flake off over the  next 2-3 weeks.  Any sutures or staples will be removed at the office during your follow-up visit. °8. ACTIVITIES:  You may resume regular daily activities (gradually increasing) beginning the next day.  Wearing a good support bra or sports bra minimizes pain and swelling.  You may have sexual intercourse when it is comfortable. °a. You may drive when you no longer are taking prescription pain medication, you can comfortably wear a seatbelt, and you can safely maneuver your car and apply brakes. °b. RETURN TO WORK:  ______________________________________________________________________________________ °9. You should see your doctor in the office for a follow-up appointment approximately two weeks after your surgery.  Your doctor’s nurse will typically make your follow-up appointment when she calls you with your pathology report.  Expect your pathology report 2-3 business days after your surgery.  You may call to check if you do not hear from us after three days. °10. OTHER INSTRUCTIONS: _______________________________________________________________________________________________ _____________________________________________________________________________________________________________________________________ °_____________________________________________________________________________________________________________________________________ °_____________________________________________________________________________________________________________________________________ ° °WHEN TO CALL YOUR DOCTOR: °1. Fever over 101.0 °2. Nausea and/or vomiting. °3. Extreme swelling or bruising. °4. Continued bleeding from incision. °5. Increased pain, redness, or drainage from the incision. ° °The clinic staff is available to answer your questions during regular business hours.  Please don’t hesitate to call and ask to speak to one of the nurses for clinical concerns.  If you have a medical emergency, go to the nearest  emergency room or call 911.  A surgeon from [facility] is always on call at the hospital. ° °For further questions, please visit [email or web address]  °

## 2017-06-14 NOTE — Anesthesia Procedure Notes (Signed)
Procedure Name: LMA Insertion Date/Time: 06/14/2017 1:23 PM Performed by: Wilburn Cornelia, CRNA Pre-anesthesia Checklist: Patient identified, Emergency Drugs available, Suction available, Patient being monitored and Timeout performed Patient Re-evaluated:Patient Re-evaluated prior to induction Oxygen Delivery Method: Circle system utilized Preoxygenation: Pre-oxygenation with 100% oxygen Induction Type: IV induction Ventilation: Mask ventilation without difficulty LMA: LMA inserted LMA Size: 4.0 Number of attempts: 1 Placement Confirmation: positive ETCO2,  CO2 detector and breath sounds checked- equal and bilateral Dental Injury: Teeth and Oropharynx as per pre-operative assessment

## 2017-06-15 ENCOUNTER — Encounter (HOSPITAL_COMMUNITY): Payer: Self-pay | Admitting: Surgery

## 2017-06-15 NOTE — Anesthesia Postprocedure Evaluation (Signed)
Anesthesia Post Note  Patient: Susan Christensen  Procedure(s) Performed: RE-EXCISION OF RIGHT BREAST LUMPECTOMY WITH RIGHT SENTINEL LYMPH NODE BX (Right Breast)     Patient location during evaluation: PACU Anesthesia Type: General Level of consciousness: awake and alert Pain management: pain level controlled Vital Signs Assessment: post-procedure vital signs reviewed and stable Respiratory status: spontaneous breathing, nonlabored ventilation, respiratory function stable and patient connected to nasal cannula oxygen Cardiovascular status: blood pressure returned to baseline and stable Postop Assessment: no apparent nausea or vomiting Anesthetic complications: no    Last Vitals:  Vitals:   06/14/17 1445 06/14/17 1451  BP: 138/74 133/66  Pulse: 83 86  Resp: 11 12  Temp: 36.5 C   SpO2: 97% 99%    Last Pain:  Vitals:   06/14/17 1451  TempSrc:   PainSc: 0-No pain                 Tiajuana Amass

## 2017-06-16 NOTE — Progress Notes (Signed)
[place]  [phone] [phone]     ID: ILETA OFARRELL DOB: Aug 21, 1955  MR#: [id number]  FUX#:[id number]  Patient Care Team: Sinda Du, MD as PCP - General (Pulmonary Disease) Danie Binder, MD as Consulting Physician (Gastroenterology) Magrinat, Virgie Dad, MD as Consulting Physician (Oncology) Erroll Luna, MD as Consulting Physician (General Surgery) Eppie Gibson, MD as Attending Physician (Radiation Oncology) Madelin Headings, DO as Referring Physician (Optometry) OTHER MD:  CHIEF COMPLAINT: Estrogen receptor positive breast cancer  CURRENT TREATMENT: Adjuvant radiation pending   HISTORY OF CURRENT ILLNESS: DYNASTIE KNOOP presented with a firm discolored right nipple. She underwent bilateral diagnostic mammography with tomography and right breast ultrasonography at [facility] on 03/30/2017 showing: breast density category C. On the right, an apparent mass enlarges and discolors the nipple. On the left, there are no discrete masses or areas of architectural distortion. Ultrasonography showed a small lobulated solid mass in the right breast at 4 o'clock lower inner quadrant measuring 0.4 x 0.3 x 0.4 cm, located 3 cm from the nipple. There is also an area of apparent architectural distortion in the upper outer right breast. Sonographic evaluation of the right axilla shows no abnormal or enlarged lymph nodes.   Accordingly on 04/06/2017 she proceeded to biopsy of the right breast mass in question. The pathology from this procedure showed ([id number]): At the 4 o'clock lower inner, intraductal papilloma. In the upper outer section of the breast: there was benign breast tissue with usual ductal epithelial hyperplasia. However this was felt to be discordant.   She underwent right lumpectomy and nipple biopsy ([id number]) on 05/30/2017 which found invasive lobular carcinoma, grade II which was focally present in the superior margin.  Prognostic indicators significant for: estrogen receptor, 90% positive and progesterone receptor, 95% positive, both with strong staining intensity. Proliferation marker Ki67 at 2%.  HER-2 was not amplified.  In the medial breast and right nipple, there was intraductal papilloma, no evidence of malignancy.  Subsequently she underwent right margin clearing and sentinel lymph node sampling ([id number]) on 06/14/2017.  The margin was clear and the single sentinel lymph node was negative.  The patient's subsequent history is as detailed below.  INTERVAL HISTORY: Lechelle was evaluated in the breast cancer clinic on 06/20/2017 accompanied by her husband, Leory Plowman. Her case was also presented at the multidisciplinary breast cancer conference 06/14/2017: At that time a preliminary plan was proposed: Antiestrogens, adjuvant radiation   REVIEW OF SYSTEMS: [place] reports that her asthma is under good control.  She exercises by walking about 8000 steps a day.  The patient denies unusual headaches, visual changes, nausea, vomiting, stiff neck, dizziness, or gait imbalance. There has been no cough, phlegm production, or pleurisy, no chest pain or pressure, and no change in bowel or bladder habits. The patient denies fever, rash, bleeding, unexplained fatigue or unexplained weight loss. A detailed review of systems was otherwise entirely negative.   PAST MEDICAL HISTORY: Past Medical History:  Diagnosis Date  . Asthma   . Cancer [facility])    Breast cancer  . Environmental and seasonal allergies   . Fatty liver   . GERD (gastroesophageal reflux disease)   . History of kidney stones   . Hypertension   . Papilloma of breast    right  Hyperlipidemia. Migraines at a [place] age.  She denies having emphysema or heart issues.   PAST SURGICAL HISTORY: Past Surgical History:  Procedure Laterality Date  . BREAST BIOPSY Right 05/30/2017   Procedure:  RIGHT NIPPLE BIOPSY;  Surgeon: Erroll Luna, MD;  Location:  [place];  Service: General;  Laterality: Right;  . BREAST LUMPECTOMY WITH RADIOACTIVE SEED LOCALIZATION Right 05/30/2017   Procedure: RIGHT BREAST LUMPECTOMY WITH RADIOACTIVE SEED LOCALIZATION x2;  Surgeon: Erroll Luna, MD;  Location: [place];  Service: General;  Laterality: Right;  . BREAST LUMPECTOMY WITH SENTINEL LYMPH NODE BIOPSY Right 06/14/2017   Procedure: RE-EXCISION OF RIGHT BREAST LUMPECTOMY WITH RIGHT SENTINEL LYMPH NODE BX;  Surgeon: Erroll Luna, MD;  Location: [place];  Service: General;  Laterality: Right;  . CESAREAN SECTION    . COLONOSCOPY  2009   Dr. Oneida Alar: normal. Screening in 10 years  . CYST REMOVAL HAND Left     FAMILY HISTORY Family History  Problem Relation Age of Onset  . Heart attack Father   . Heart disease Mother        has pacemaker  . Colon cancer Neg Hx   . Colon polyps Neg Hx   . Breast cancer Neg Hx   The patient's mother is 88 years old as of March 2019. The patient's father passed away due to heart issues at age 54. The patient has one brother. The patient's sister passed away in her 64's with down syndrome. The patient has a maternal aunt that was diagnosed with breast cancer in her 45's. The patient's cousin (who was the daughter of the aunt with breast cancer) passed away at a S[place]ter age due to melanoma.    GYNECOLOGIC HISTORY:  No LMP recorded. Patient is postmenopausal. Menarche: 62 years old Age at first live birth: 62 years old The patient is GxP1. Her LMP was around the age of 62 that lingered for a few years starting at 3.  She used oral contraceptive pills for about 6 years in order to regulate her periods. She denies having any issues.  She did not use HRT.    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 Her husband, Leory Plowman, is an Best boy. The patient's daughter, Judson Roch, is age 85 and in college, studying web/media Agricultural consultant. The  patient does not have grandchildren. 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    ADVANCED DIRECTIVES: [place] MAINTENANCE: Social History   Tobacco Use  . Smoking status: Never Smoker  . Smokeless tobacco: Never Used  Substance Use Topics  . Alcohol use: Yes    Comment: rare   . Drug use: No     Colonoscopy: about 9 years ago  PAP: not UTD  Bone density: no   Allergies  Allergen Reactions  . Shellfish Allergy Anaphylaxis    Current Outpatient Medications  Medication Sig Dispense Refill  . albuterol (VENTOLIN HFA) 108 (90 BASE) MCG/ACT inhaler Inhale 2 puffs into the lungs every 6 (six) hours as needed for wheezing or shortness of breath.    Marland Kitchen amLODipine (NORVASC) 10 MG tablet Take 10 mg by mouth daily.    . ARNUITY ELLIPTA 200 MCG/ACT AEPB Inhale 1 puff into the lungs every morning.     . benazepril (LOTENSIN) 20 MG tablet Take 20 mg by mouth daily.    . cholecalciferol (VITAMIN D) 1000 UNITS tablet Take 1,000 Units by mouth daily.    . fluticasone (FLONASE) 50 MCG/ACT nasal spray Place 1 spray into both nostrils daily.     Marland Kitchen HYDROcodone-acetaminophen (NORCO/VICODIN) 5-325 MG tablet Take 1 tablet by mouth every 6 (six) hours as  needed for moderate pain. 10 tablet 0  . ibuprofen (ADVIL,MOTRIN) 800 MG tablet Take 1 tablet (800 mg total) by mouth every 8 (eight) hours as needed. 30 tablet 0  . oxyCODONE (OXY IR/ROXICODONE) 5 MG immediate release tablet Take 1 tablet (5 mg total) by mouth every 6 (six) hours as needed for severe pain. 10 tablet 0  . Propylene Glycol (SYSTANE BALANCE OP) Apply 1 drop to eye every morning. Pt may use up to 4 times daily prn     No current facility-administered medications for this visit.     OBJECTIVE: Middle-aged white woman who appears stated age  62:   06/20/17 1540  BP: (!) 139/54  Pulse: 87  Resp: 18  Temp: 99.4 F (37.4 C)  SpO2: 98%     Body mass index is 29.04 kg/m.   Wt Readings from Last 3  Encounters:  06/20/17 153 lb 11.2 oz (69.7 kg)  06/20/17 153 lb 9.6 oz (69.7 kg)  06/14/17 155 lb (70.3 kg)      ECOG FS:0 - Asymptomatic  Ocular: Sclerae unicteric, pupils round and equal Ear-nose-throat: Oropharynx clear and moist Lymphatic: No cervical or supraclavicular adenopathy Lungs no rales or rhonchi Heart regular rate and rhythm Abd soft, nontender, positive bowel sounds MSK no focal spinal tenderness, no joint edema Neuro: non-focal, well-oriented, appropriate affect Breasts: The right breast it is status post recent surgery.  All 3 incisions are healing very nicely, with no dehiscence, swelling, or erythema.  The left breast is unremarkable.  Both axillae are benign   LAB RESULTS:  CMP     Component Value Date/Time   NA 139 06/20/2017 1524   K 4.5 06/20/2017 1524   CL 101 06/20/2017 1524   CO2 28 06/20/2017 1524   GLUCOSE 142 (H) 06/20/2017 1524   BUN 14 06/20/2017 1524   CREATININE 1.00 06/20/2017 1524   CALCIUM 9.9 06/20/2017 1524   PROT 7.5 06/20/2017 1524   ALBUMIN 3.9 06/20/2017 1524   AST 23 06/20/2017 1524   ALT 35 06/20/2017 1524   ALKPHOS 87 06/20/2017 1524   BILITOT 0.8 06/20/2017 1524   GFRNONAA 60 (L) 06/20/2017 1524   GFRAA >60 06/20/2017 1524    No results found for: TOTALPROTELP, ALBUMINELP, A1GS, A2GS, BETS, BETA2SER, GAMS, MSPIKE, SPEI  No results found for: KPAFRELGTCHN, LAMBDASER, KAPLAMBRATIO  Lab Results  Component Value Date   WBC 12.3 (H) 06/20/2017   NEUTROABS 8.8 (H) 06/20/2017   HGB 14.6 06/20/2017   HCT 44.0 06/20/2017   MCV 91.9 06/20/2017   PLT 306 06/20/2017    '@LASTCHEMISTRY' @  No results found for: LABCA2  No components found for: UMPNTI144  No results for input(s): INR in the last 168 hours.  No results found for: LABCA2  No results found for: RXV400  No results found for: QQP619  No results found for: JKD326  No results found for: CA2729  No components found for: HGQUANT  No results found for:  CEA1 / No results found for: CEA1   No results found for: AFPTUMOR  No results found for: CHROMOGRNA  No results found for: PSA1  Appointment on 06/20/2017  Component Date Value Ref Range Status  . Sodium 06/20/2017 139  136 - 145 mmol/L Final  . Potassium 06/20/2017 4.5  3.5 - 5.1 mmol/L Final  . Chloride 06/20/2017 101  98 - 109 mmol/L Final  . CO2 06/20/2017 28  22 - 29 mmol/L Final  . Glucose, Bld 06/20/2017 142* 70 - 140 mg/dL Final  .  BUN 06/20/2017 14  7 - 26 mg/dL Final  . Creatinine, Ser 06/20/2017 1.00  0.60 - 1.10 mg/dL Final  . Calcium 06/20/2017 9.9  8.4 - 10.4 mg/dL Final  . Total Protein 06/20/2017 7.5  6.4 - 8.3 g/dL Final  . Albumin 06/20/2017 3.9  3.5 - 5.0 g/dL Final  . AST 06/20/2017 23  5 - 34 U/L Final  . ALT 06/20/2017 35  0 - 55 U/L Final  . Alkaline Phosphatase 06/20/2017 87  40 - 150 U/L Final  . Total Bilirubin 06/20/2017 0.8  0.2 - 1.2 mg/dL Final  . GFR calc non Af Amer 06/20/2017 60* >60 mL/min Final  . GFR calc Af Amer 06/20/2017 >60  >60 mL/min Final   Comment: (NOTE) The eGFR has been calculated using the CKD EPI equation. This calculation has not been validated in all clinical situations. eGFR's persistently <60 mL/min signify possible Chronic Kidney Disease.   Georgiann Hahn gap 06/20/2017 10  3 - 11 Final   Performed at [facility], [place] [address], [place], [place] [address]  . WBC 06/20/2017 12.3* 3.9 - 10.3 K/uL Final  . RBC 06/20/2017 4.79  3.70 - 5.45 MIL/uL Final  . Hemoglobin 06/20/2017 14.6  11.6 - 15.9 g/dL Final  . HCT 06/20/2017 44.0  34.8 - 46.6 % Final  . MCV 06/20/2017 91.9  79.5 - 101.0 fL Final  . MCH 06/20/2017 30.5  25.1 - 34.0 pg Final  . MCHC 06/20/2017 33.2  31.5 - 36.0 g/dL Final  . RDW 06/20/2017 12.7  11.2 - 14.5 % Final  . Platelets 06/20/2017 306  145 - 400 K/uL Final  . Neutrophils Relative % 06/20/2017 71  % Final  . Neutro Abs 06/20/2017 8.8* 1.5 - 6.5 K/uL Final  . Lymphocytes Relative  06/20/2017 19  % Final  . Lymphs Abs 06/20/2017 2.3  0.9 - 3.3 K/uL Final  . Monocytes Relative 06/20/2017 7  % Final  . Monocytes Absolute 06/20/2017 0.9  0.1 - 0.9 K/uL Final  . Eosinophils Relative 06/20/2017 2  % Final  . Eosinophils Absolute 06/20/2017 0.2  0.0 - 0.5 K/uL Final  . Basophils Relative 06/20/2017 1  % Final  . Basophils Absolute 06/20/2017 0.1  0.0 - 0.1 K/uL Final   Performed at [facility], [place] [address] [place], [place] [address]    (this displays the last labs from the last 3 days)  No results found for: TOTALPROTELP, ALBUMINELP, A1GS, A2GS, BETS, BETA2SER, GAMS, MSPIKE, SPEI (this displays SPEP labs)  No results found for: KPAFRELGTCHN, LAMBDASER, KAPLAMBRATIO (kappa/lambda light chains)  No results found for: HGBA, HGBA2QUANT, HGBFQUANT, HGBSQUAN (Hemoglobinopathy evaluation)   No results found for: LDH  No results found for: IRON, TIBC, IRONPCTSAT (Iron and TIBC)  No results found for: FERRITIN  Urinalysis No results found for: COLORURINE, APPEARANCEUR, LABSPEC, PHURINE, GLUCOSEU, HGBUR, BILIRUBINUR, KETONESUR, PROTEINUR, UROBILINOGEN, NITRITE, LEUKOCYTESUR   STUDIES: Nm Sentinel Node Inj-no Rpt (breast)  Result Date: 06/14/2017 Sulfur colloid was injected by the nuclear medicine technologist for melanoma sentinel node.   Mm Breast Surgical Specimen  Result Date: 05/30/2017 CLINICAL DATA:  Second specimen following radioactive seed localization. EXAM: SPECIMEN RADIOGRAPH OF THE LEFT BREAST COMPARISON:  Previous exam(s). FINDINGS: Status post excision of the right breast. The radioactive seed and coil shaped biopsy marker clip are present, completely intact, and were marked for pathology. IMPRESSION: Specimen radiograph of the right breast. Electronically Signed   By: Everardo Beals.D.  On: 05/30/2017 09:32   Mm Breast Surgical Specimen  Result Date: 05/30/2017 CLINICAL DATA:  Surgical excision of an intraductal  papilloma in the 4 o'clock position of the right breast. EXAM: SPECIMEN RADIOGRAPH OF THE RIGHT BREAST COMPARISON:  Previous exam(s). FINDINGS: Status post excision of the right breast. The ribbon shaped biopsy marker clip is in the specimen. There is also a small mass in the specimen. These are completely intact, and were marked for pathology. A separate container was submitted containing the radioactive seed. IMPRESSION: Specimen radiograph of the right breast. Electronically Signed   By: Claudie Revering M.D.   On: 05/30/2017 09:24   Mm Rt Radioactive Seed Loc Mammo Guide  Result Date: 05/29/2017 CLINICAL DATA:  62 year old female presenting for radioactive seed localization of right breast prior to excisional biopsy of a papilloma in the lower-inner quadrant and a discordant area biopsied in the upper-outer quadrant. EXAM: MAMMOGRAPHIC GUIDED RADIOACTIVE SEED LOCALIZATION OF THE RIGHT BREAST COMPARISON:  Previous exam(s). FINDINGS: Patient presents for radioactive seed localization prior to . I met with the patient and we discussed the procedure of seed localization including benefits and alternatives. We discussed the high likelihood of a successful procedure. We discussed the risks of the procedure including infection, bleeding, tissue injury and further surgery. We discussed the low dose of radioactivity involved in the procedure. Informed, written consent was given. The usual time-out protocol was performed immediately prior to the procedure. SITE 1: Using mammographic guidance, sterile technique, 1% lidocaine and an I-125 radioactive seed, the biopsy-proven papilloma in the lower-inner quadrant of the right breast was localized using a medial approach. The follow-up mammogram images confirm the seed in the expected location and were marked for Dr. Brantley Stage. Follow-up survey of the patient confirms presence of the radioactive seed. Order number of I-125 seed:  [id number]. Total activity:  7.062 millicuries  reference Date: 05/15/2017 SITE 2: Using mammographic guidance, sterile technique, 1% lidocaine and an I-125 radioactive seed, the discordant area of biopsy in the upper-outer quadrant of the right breast was localized using a superior approach. The follow-up mammogram images confirm the seed in the expected location and were marked for Dr. Brantley Stage. Follow-up survey of the patient confirms presence of the radioactive seed. Order number of I-125 seed:  [id number]. Total activity:  7.616 millicuries reference Date: 05/15/2017 The patient tolerated the procedure well and was released from the [facility]. She was given instructions regarding seed removal. IMPRESSION: Radioactive seed localization for 2 sites in the right breast. No apparent complications. Electronically Signed   By: Ammie Ferrier M.D.   On: 05/29/2017 15:24   Mm Rt Radio Seed Ea Add Lesion Loc Mammo  Result Date: 05/29/2017 CLINICAL DATA:  62 year old female presenting for radioactive seed localization of right breast prior to excisional biopsy of a papilloma in the lower-inner quadrant and a discordant area biopsied in the upper-outer quadrant. EXAM: MAMMOGRAPHIC GUIDED RADIOACTIVE SEED LOCALIZATION OF THE RIGHT BREAST COMPARISON:  Previous exam(s). FINDINGS: Patient presents for radioactive seed localization prior to . I met with the patient and we discussed the procedure of seed localization including benefits and alternatives. We discussed the high likelihood of a successful procedure. We discussed the risks of the procedure including infection, bleeding, tissue injury and further surgery. We discussed the low dose of radioactivity involved in the procedure. Informed, written consent was given. The usual time-out protocol was performed immediately prior to the procedure. SITE 1: Using mammographic guidance, sterile technique, 1% lidocaine and an I-125 radioactive seed,  the biopsy-proven papilloma in the lower-inner quadrant of the right  breast was localized using a medial approach. The follow-up mammogram images confirm the seed in the expected location and were marked for Dr. Brantley Stage. Follow-up survey of the patient confirms presence of the radioactive seed. Order number of I-125 seed:  [id number]. Total activity:  4.098 millicuries reference Date: 05/15/2017 SITE 2: Using mammographic guidance, sterile technique, 1% lidocaine and an I-125 radioactive seed, the discordant area of biopsy in the upper-outer quadrant of the right breast was localized using a superior approach. The follow-up mammogram images confirm the seed in the expected location and were marked for Dr. Brantley Stage. Follow-up survey of the patient confirms presence of the radioactive seed. Order number of I-125 seed:  [id number]. Total activity:  5.621 millicuries reference Date: 05/15/2017 The patient tolerated the procedure well and was released from the [facility]. She was given instructions regarding seed removal. IMPRESSION: Radioactive seed localization for 2 sites in the right breast. No apparent complications. Electronically Signed   By: Ammie Ferrier M.D.   On: 05/29/2017 15:24    ELIGIBLE FOR AVAILABLE RESEARCH PROTOCOL: no  ASSESSMENT: 62 y.o. [place], [place] woman status post right breast inner lower quadrant pT1a pNX, stage IA invasive lobular carcinoma, grade 1, estrogen and progesterone receptor positive, HER-2 not amplified, with an MIB-1 of 2%  (1) positive margin cleared with additional surgery 06/14/2017; sentinel lymph node (single) was negative (pT1a pN0)  (2) adjuvant radiation pending  (3) to start antiestrogens at the completion of local therapy  PLAN: We spent the better part of today's hour-long appointment discussing the biology of her diagnosis and the specifics of her situation. We first reviewed the fact that cancer is not one disease but more than 100 different diseases and that it is important to keep them separate-- otherwise when  friends and relatives discuss their own cancer experiences with [place] confusion can result. Similarly we explained that if breast cancer spreads to the bone or liver, the patient would not have bone cancer or liver cancer, but breast cancer in the bone and breast cancer in the liver: one cancer in three places-- not 3 different cancers which otherwise would have to be treated in 3 different ways.  We discussed the difference between local and systemic therapy. In terms of loco-regional treatment, lumpectomy plus radiation is equivalent to mastectomy as far as survival is concerned. For this reason, and because the cosmetic results are generally superior, we recommended breast conserving surgery.  She understands the purpose of radiation is to lower the risk of local recurrence and that radiation is very effective at that  We then discussed the rationale for systemic therapy. There is some risk that this cancer may have already spread to other parts of her body.  That risk in cancers as small as hers is very small: The vast majority of cancers like hers would be expected to be cured with local treatment only  However because she did not carry a child to term before the age of 67 and because she does have this cancer, her risk of developing another breast cancer in the future is at least 1 %/year.  Systemic therapy may reduce that risk so we went over the options for systemic therapy which are anti-estrogens, anti-HER-2 immunotherapy, and chemotherapy.   Idali does not meet criteria for anti-HER-2 immunotherapy.  Chemotherapy is not indicated in  pT1a cancers.  On the other hand she is a good candidate for anti-estrogens and we discussed  that today in some detail.  I do recommend that once she can completes radiation she start antiestrogens.  This will cut the risk of recurrence in half.  I am concerned also that she does have a lobular breast cancer in the setting of dense breasts and this hopefully  will not only reduce the risk of local recurrence, but should accelerate the transition of the breast to more fatty tissue, which is less dense, and therefore mammograms should become more informative  The plan then is for her to proceed to radiation after which she will return to see me.  We will likely start anastrozole at that time  Know that she has not had a bone density today  Pami has a good understanding of the overall plan. She agrees with it. She knows the goal of treatment in her case is cure. She will call with any problems that may develop before her next visit here.   Magrinat, Virgie Dad, MD  06/20/17 4:27 PM Medical Oncology and Hematology [facility] [address] [place], [place] [address] Tel. [phone]    Fax. [phone]  This document serves as a record of services personally performed by Lurline Del, MD. It was created on his behalf by Sheron Nightingale, a trained medical scribe. The creation of this record is based on the scribe's personal observations and the provider's statements to them.   I have reviewed the above documentation for accuracy and completeness, and I agree with the above.

## 2017-06-19 ENCOUNTER — Other Ambulatory Visit: Payer: Self-pay

## 2017-06-20 ENCOUNTER — Encounter: Payer: Self-pay | Admitting: Radiation Oncology

## 2017-06-20 ENCOUNTER — Inpatient Hospital Stay: Payer: BLUE CROSS/BLUE SHIELD | Attending: Oncology | Admitting: Oncology

## 2017-06-20 ENCOUNTER — Ambulatory Visit
Admission: RE | Admit: 2017-06-20 | Discharge: 2017-06-20 | Disposition: A | Discharge status: Home / Self Care | Payer: BLUE CROSS/BLUE SHIELD | Source: Ambulatory Visit | Attending: Radiation Oncology | Admitting: Radiation Oncology

## 2017-06-20 ENCOUNTER — Other Ambulatory Visit: Payer: Self-pay

## 2017-06-20 ENCOUNTER — Inpatient Hospital Stay: Payer: BLUE CROSS/BLUE SHIELD

## 2017-06-20 VITALS — BP 138/71 | HR 91 | Temp 99.0°F | Ht 61.0 in | Wt 153.6 lb

## 2017-06-20 DIAGNOSIS — Z170 Estrogen receptor positive status [ER+]: Secondary | ICD-10-CM

## 2017-06-20 DIAGNOSIS — Z79899 Other long term (current) drug therapy: Secondary | ICD-10-CM

## 2017-06-20 DIAGNOSIS — Z87442 Personal history of urinary calculi: Secondary | ICD-10-CM | POA: Diagnosis not present

## 2017-06-20 DIAGNOSIS — C50311 Malignant neoplasm of lower-inner quadrant of right female breast: Secondary | ICD-10-CM | POA: Insufficient documentation

## 2017-06-20 DIAGNOSIS — Z91013 Allergy to seafood: Secondary | ICD-10-CM | POA: Insufficient documentation

## 2017-06-20 DIAGNOSIS — Z9889 Other specified postprocedural states: Secondary | ICD-10-CM | POA: Insufficient documentation

## 2017-06-20 DIAGNOSIS — Z853 Personal history of malignant neoplasm of breast: Secondary | ICD-10-CM | POA: Insufficient documentation

## 2017-06-20 DIAGNOSIS — J45909 Unspecified asthma, uncomplicated: Secondary | ICD-10-CM | POA: Diagnosis not present

## 2017-06-20 DIAGNOSIS — I10 Essential (primary) hypertension: Secondary | ICD-10-CM | POA: Insufficient documentation

## 2017-06-20 DIAGNOSIS — Z79891 Long term (current) use of opiate analgesic: Secondary | ICD-10-CM | POA: Diagnosis not present

## 2017-06-20 DIAGNOSIS — Z8249 Family history of ischemic heart disease and other diseases of the circulatory system: Secondary | ICD-10-CM | POA: Insufficient documentation

## 2017-06-20 LAB — COMPREHENSIVE METABOLIC PANEL
ALK PHOS: 87 U/L (ref 40–150)
ALT: 35 U/L (ref 0–55)
ANION GAP: 10 (ref 3–11)
AST: 23 U/L (ref 5–34)
Albumin: 3.9 g/dL (ref 3.5–5.0)
BILIRUBIN TOTAL: 0.8 mg/dL (ref 0.2–1.2)
BUN: 14 mg/dL (ref 7–26)
CALCIUM: 9.9 mg/dL (ref 8.4–10.4)
CO2: 28 mmol/L (ref 22–29)
Chloride: 101 mmol/L (ref 98–109)
Creatinine, Ser: 1 mg/dL (ref 0.60–1.10)
GFR calc non Af Amer: 60 mL/min — ABNORMAL LOW (ref >60)
Glucose, Bld: 142 mg/dL — ABNORMAL HIGH (ref 70–140)
Potassium: 4.5 mmol/L (ref 3.5–5.1)
Sodium: 139 mmol/L (ref 136–145)
TOTAL PROTEIN: 7.5 g/dL (ref 6.4–8.3)

## 2017-06-20 LAB — CBC WITH DIFFERENTIAL/PLATELET
BASOS ABS: 0.1 10*3/uL (ref 0.0–0.1)
BASOS PCT: 1 %
EOS ABS: 0.2 10*3/uL (ref 0.0–0.5)
Eosinophils Relative: 2 %
HEMATOCRIT: 44 % (ref 34.8–46.6)
HEMOGLOBIN: 14.6 g/dL (ref 11.6–15.9)
Lymphocytes Relative: 19 %
Lymphs Abs: 2.3 10*3/uL (ref 0.9–3.3)
MCH: 30.5 pg (ref 25.1–34.0)
MCHC: 33.2 g/dL (ref 31.5–36.0)
MCV: 91.9 fL (ref 79.5–101.0)
Monocytes Absolute: 0.9 10*3/uL (ref 0.1–0.9)
Monocytes Relative: 7 %
NEUTROS ABS: 8.8 10*3/uL — AB (ref 1.5–6.5)
NEUTROS PCT: 71 %
Platelets: 306 10*3/uL (ref 145–400)
RBC: 4.79 MIL/uL (ref 3.70–5.45)
RDW: 12.7 % (ref 11.2–14.5)
WBC: 12.3 10*3/uL — ABNORMAL HIGH (ref 3.9–10.3)

## 2017-06-20 NOTE — Progress Notes (Signed)
Radiation Oncology         [phone] ________________________________  Initial outpatient Consultation  Name: Susan Christensen MRN: [id number]  Date: 06/20/2017  DOB: 04-May-1955  RF:XJOITGP, Percell Miller, MD  Erroll Luna, MD   REFERRING PHYSICIAN: Erroll Luna, MD  DIAGNOSIS:    ICD-10-CM   1. Malignant neoplasm of lower-inner quadrant of right breast of female, estrogen receptor positive ([place]) C50.311    Z17.0    StageIA  pT1a N0 M0 Right Breast UOQ Invasive Ductal Carcinoma, ER 90% / PR 95% / Her2 neg, Grade II  CHIEF COMPLAINT: Here to discuss management of her right breast cancer  HISTORY OF PRESENT ILLNESS::Susan Christensen is a 62 y.o. female who presented with noticeable changes to her right nipple over the course of a few months. She states it appeared as a blood blister.   A mammogram was performed on 03/30/17. Imaging showed enlarged nipple on the right compared to left. In the lower inner quadrant, there is a small lobulated mass and in the upper outer quadrant there is a subtle area of architectural distortion. There were no discrete masses or areas of architectural distortion on the left. Scattered benign-appearing calcification were seen bilaterally.   The patient continued to targeted ultrasound which showed enlarged, heterogeneous right nipple with appearance of a mass measuring 11 mm in greatest dimension. Also appreciated was questionable small adjacent dermal mass measuring 7 mm. In the lower inner quadrant at 4 o'clock, 3 cm from nipple, there is a 4 x 3 x 4 mm mass with internal blood flow on color Doppler analysis. Margins lobulated. No mass was seen in the upper outer quadrant to correspond to area of apparent architectural distortion. Right axilla showed no abnormal or enlarged lymph nodes.  Patient proceeded to right breast biopsy on 04/06/17 with pathology returning benign breast tissue at upper outer quadrant of right breast with ductal epithelial  hyperplasia of unusual type, sclerosis and focal microcalcifications. Intraductal papilloma was fonud at 4: o'clock position of right breast.  Dr. Brantley Stage ordered MRI to be completed before lumpectomy. This showed indeterminate round enhancing right nipple mass measuring 9 x 9 x 11 mm compatible with known clinically evident abnormality.   He then performed right superior breast lumpectomy on 05/30/17 with pathology returning invasive lobular carcinoma Grade II spanning 0.3 cm in the superior specimen, with characteristics as described above.  Also found was lobular carcinoma in situ. Invasive carcinoma was focally present at the right superior margin.  Medial right lumpectomy and right nipple biopsy both showed papilloma.  Dr. Brantley Stage re-excised patient's superior lumpectomy site on 06/14/17 with pathology showing right margin clear of carcinoma and 0/1 right axillary nodes (SLN) for malignancy.  The patient is scheduled for consult with Dr. Jana Hakim later today.  On review of systems, patient states her maternal aunt underwent mastectomy for cancer (roughly aged 34+ years). She endorses soreness at the surgical site. She endorses some swelling at right axilla.    PREVIOUS RADIATION THERAPY: No  PAST MEDICAL HISTORY:  has a past medical history of Asthma, Cancer ([place]), Environmental and seasonal allergies, Fatty liver, GERD (gastroesophageal reflux disease), History of kidney stones, Hypertension, and Papilloma of breast.    PAST SURGICAL HISTORY: Past Surgical History:  Procedure Laterality Date  . BREAST BIOPSY Right 05/30/2017   Procedure: RIGHT NIPPLE BIOPSY;  Surgeon: Erroll Luna, MD;  Location: [facility];  Service: General;  Laterality: Right;  . BREAST LUMPECTOMY WITH RADIOACTIVE SEED LOCALIZATION Right 05/30/2017  Procedure: RIGHT BREAST LUMPECTOMY WITH RADIOACTIVE SEED LOCALIZATION x2;  Surgeon: Erroll Luna, MD;  Location: [facility];  Service:  General;  Laterality: Right;  . BREAST LUMPECTOMY WITH SENTINEL LYMPH NODE BIOPSY Right 06/14/2017   Procedure: RE-EXCISION OF RIGHT BREAST LUMPECTOMY WITH RIGHT SENTINEL LYMPH NODE BX;  Surgeon: Erroll Luna, MD;  Location: [facility];  Service: General;  Laterality: Right;  . CESAREAN SECTION    . COLONOSCOPY  2009   Dr. Oneida Alar: normal. Screening in 10 years  . CYST REMOVAL HAND Left     FAMILY HISTORY: family history includes Heart attack in her father; Heart disease in her mother.  SOCIAL HISTORY:  reports that  has never smoked. she has never used smokeless tobacco. She reports that she drinks alcohol. She reports that she does not use drugs.  ALLERGIES: Shellfish allergy  MEDICATIONS:  Current Outpatient Medications  Medication Sig Dispense Refill  . albuterol (VENTOLIN HFA) 108 (90 BASE) MCG/ACT inhaler Inhale 2 puffs into the lungs every 6 (six) hours as needed for wheezing or shortness of breath.    Marland Kitchen amLODipine (NORVASC) 10 MG tablet Take 10 mg by mouth daily.    . ARNUITY ELLIPTA 200 MCG/ACT AEPB Inhale 1 puff into the lungs every morning.     . benazepril (LOTENSIN) 20 MG tablet Take 20 mg by mouth daily.    . cholecalciferol (VITAMIN D) 1000 UNITS tablet Take 1,000 Units by mouth daily.    . fluticasone (FLONASE) 50 MCG/ACT nasal spray Place 1 spray into both nostrils daily.     Marland Kitchen oxyCODONE (OXY IR/ROXICODONE) 5 MG immediate release tablet Take 1 tablet (5 mg total) by mouth every 6 (six) hours as needed for severe pain. 10 tablet 0  . Propylene Glycol (SYSTANE BALANCE OP) Apply 1 drop to eye every morning. Pt may use up to 4 times daily prn    . HYDROcodone-acetaminophen (NORCO/VICODIN) 5-325 MG tablet Take 1 tablet by mouth every 6 (six) hours as needed for moderate pain. 10 tablet 0  . ibuprofen (ADVIL,MOTRIN) 800 MG tablet Take 1 tablet (800 mg total) by mouth every 8 (eight) hours as needed. 30 tablet 0   No current facility-administered medications for this encounter.      REVIEW OF SYSTEMS: A 10+ POINT REVIEW OF SYSTEMS WAS OBTAINED including  dermatology,  cardiac, respiratory, lymph, extremities, GI, GU, Musculoskeletal, constitutional, breasts, reproductive, HEENT.  All pertinent positives are noted in the HPI.  All others are negative.   PHYSICAL EXAM:  height is '5\' 1"'  (1.549 m) and weight is 153 lb 9.6 oz (69.7 kg). Her temperature is 99 F (37.2 C). Her blood pressure is 138/71 and her pulse is 91. Her oxygen saturation is 98%.   General: Alert and oriented, in no acute distress HEENT: Head is normocephalic. Extraocular movements are intact. Oropharynx is clear. Neck: Neck is supple, no palpable cervical or supraclavicular lymphadenopathy. Heart: Regular in rate and rhythm with no murmurs, rubs, or gallops. Chest: Scattered high pitched wheezes bilaterally. Abdomen: Soft, nontender, nondistended, with no rigidity or guarding. Extremities: No cyanosis or edema. Lymphatics: see Neck Exam Skin: No concerning lesions. Musculoskeletal: symmetric strength and muscle tone throughout. Neurologic: Cranial nerves II through XII are grossly intact. No obvious focalities. Speech is fluent. Coordination is intact. Psychiatric: Judgment and insight are intact. Affect is appropriate. Breasts: Healing scars over her right nipple, medial border of the right areola, and in the UOQ, and axilla of the right breast. Slight nipple inversion noted  on right.  Not palpated deeply as she was tender.  ECOG = 0  0 - Asymptomatic (Fully active, able to carry on all predisease activities without restriction)  1 - Symptomatic but completely ambulatory (Restricted in physically strenuous activity but ambulatory and able to carry out work of a light or sedentary nature. For example, light housework, office work)  2 - Symptomatic, <50% in bed during the day (Ambulatory and capable of all self care but unable to carry out any work activities. Up and about more than 50% of waking  hours)  3 - Symptomatic, >50% in bed, but not bedbound (Capable of only limited self-care, confined to bed or chair 50% or more of waking hours)  4 - Bedbound (Completely disabled. Cannot carry on any self-care. Totally confined to bed or chair)  5 - Death   Eustace Pen MM, Creech RH, Tormey DC, et al. [phone]). "Toxicity and response criteria of the [employer]". [place] Oncol. 5 (6): 649-55   LABORATORY DATA:  Lab Results  Component Value Date   WBC 12.3 (H) 06/20/2017   HGB 14.6 06/20/2017   HCT 44.0 06/20/2017   MCV 91.9 06/20/2017   PLT 306 06/20/2017   CMP     Component Value Date/Time   NA 139 06/20/2017 1524   K 4.5 06/20/2017 1524   CL 101 06/20/2017 1524   CO2 28 06/20/2017 1524   GLUCOSE 142 (H) 06/20/2017 1524   BUN 14 06/20/2017 1524   CREATININE 1.00 06/20/2017 1524   CALCIUM 9.9 06/20/2017 1524   PROT 7.5 06/20/2017 1524   ALBUMIN 3.9 06/20/2017 1524   AST 23 06/20/2017 1524   ALT 35 06/20/2017 1524   ALKPHOS 87 06/20/2017 1524   BILITOT 0.8 06/20/2017 1524   GFRNONAA 60 (L) 06/20/2017 1524   GFRAA >60 06/20/2017 1524         RADIOGRAPHY: Nm Sentinel Node Inj-no Rpt (breast)  Result Date: 06/14/2017 Sulfur colloid was injected by the nuclear medicine technologist for melanoma sentinel node.   Mm Breast Surgical Specimen  Result Date: 05/30/2017 CLINICAL DATA:  Second specimen following radioactive seed localization. EXAM: SPECIMEN RADIOGRAPH OF THE LEFT BREAST COMPARISON:  Previous exam(s). FINDINGS: Status post excision of the right breast. The radioactive seed and coil shaped biopsy marker clip are present, completely intact, and were marked for pathology. IMPRESSION: Specimen radiograph of the right breast. Electronically Signed   By: Nolon Nations M.D.   On: 05/30/2017 09:32   Mm Breast Surgical Specimen  Result Date: 05/30/2017 CLINICAL DATA:  Surgical excision of an intraductal papilloma in the 4 o'clock position of the  right breast. EXAM: SPECIMEN RADIOGRAPH OF THE RIGHT BREAST COMPARISON:  Previous exam(s). FINDINGS: Status post excision of the right breast. The ribbon shaped biopsy marker clip is in the specimen. There is also a small mass in the specimen. These are completely intact, and were marked for pathology. A separate container was submitted containing the radioactive seed. IMPRESSION: Specimen radiograph of the right breast. Electronically Signed   By: Claudie Revering M.D.   On: 05/30/2017 09:24   Mm Rt Radioactive Seed Loc Mammo Guide  Result Date: 05/29/2017 CLINICAL DATA:  62 year old female presenting for radioactive seed localization of right breast prior to excisional biopsy of a papilloma in the lower-inner quadrant and a discordant area biopsied in the upper-outer quadrant. EXAM: MAMMOGRAPHIC GUIDED RADIOACTIVE SEED LOCALIZATION OF THE RIGHT BREAST COMPARISON:  Previous exam(s). FINDINGS: Patient presents for radioactive seed localization prior to .  I met with the patient and we discussed the procedure of seed localization including benefits and alternatives. We discussed the high likelihood of a successful procedure. We discussed the risks of the procedure including infection, bleeding, tissue injury and further surgery. We discussed the low dose of radioactivity involved in the procedure. Informed, written consent was given. The usual time-out protocol was performed immediately prior to the procedure. SITE 1: Using mammographic guidance, sterile technique, 1% lidocaine and an I-125 radioactive seed, the biopsy-proven papilloma in the lower-inner quadrant of the right breast was localized using a medial approach. The follow-up mammogram images confirm the seed in the expected location and were marked for Dr. Brantley Stage. Follow-up survey of the patient confirms presence of the radioactive seed. Order number of I-125 seed:  [id number]. Total activity:  2.440 millicuries reference Date: 05/15/2017 SITE 2: Using  mammographic guidance, sterile technique, 1% lidocaine and an I-125 radioactive seed, the discordant area of biopsy in the upper-outer quadrant of the right breast was localized using a superior approach. The follow-up mammogram images confirm the seed in the expected location and were marked for Dr. Brantley Stage. Follow-up survey of the patient confirms presence of the radioactive seed. Order number of I-125 seed:  [id number]. Total activity:  4.403 millicuries reference Date: 05/15/2017 The patient tolerated the procedure well and was released from the [facility]. She was given instructions regarding seed removal. IMPRESSION: Radioactive seed localization for 2 sites in the right breast. No apparent complications. Electronically Signed   By: Ammie Ferrier M.D.   On: 05/29/2017 15:24   Mm Rt Radio Seed Ea Add Lesion Loc Mammo  Result Date: 05/29/2017 CLINICAL DATA:  62 year old female presenting for radioactive seed localization of right breast prior to excisional biopsy of a papilloma in the lower-inner quadrant and a discordant area biopsied in the upper-outer quadrant. EXAM: MAMMOGRAPHIC GUIDED RADIOACTIVE SEED LOCALIZATION OF THE RIGHT BREAST COMPARISON:  Previous exam(s). FINDINGS: Patient presents for radioactive seed localization prior to . I met with the patient and we discussed the procedure of seed localization including benefits and alternatives. We discussed the high likelihood of a successful procedure. We discussed the risks of the procedure including infection, bleeding, tissue injury and further surgery. We discussed the low dose of radioactivity involved in the procedure. Informed, written consent was given. The usual time-out protocol was performed immediately prior to the procedure. SITE 1: Using mammographic guidance, sterile technique, 1% lidocaine and an I-125 radioactive seed, the biopsy-proven papilloma in the lower-inner quadrant of the right breast was localized using a medial  approach. The follow-up mammogram images confirm the seed in the expected location and were marked for Dr. Brantley Stage. Follow-up survey of the patient confirms presence of the radioactive seed. Order number of I-125 seed:  [id number]. Total activity:  8.756 millicuries reference Date: 05/15/2017 SITE 2: Using mammographic guidance, sterile technique, 1% lidocaine and an I-125 radioactive seed, the discordant area of biopsy in the upper-outer quadrant of the right breast was localized using a superior approach. The follow-up mammogram images confirm the seed in the expected location and were marked for Dr. Brantley Stage. Follow-up survey of the patient confirms presence of the radioactive seed. Order number of I-125 seed:  [id number]. Total activity:  4.166 millicuries reference Date: 05/15/2017 The patient tolerated the procedure well and was released from the [facility]. She was given instructions regarding seed removal. IMPRESSION: Radioactive seed localization for 2 sites in the right breast. No apparent complications. Electronically Signed   By: Ammie Ferrier  M.D.   On: 05/29/2017 15:24      IMPRESSION/PLAN: right breast cancer  She will see medical oncology later this afternoon. I think it's unlikely she will receive chemotherapy. We will tentatively schedule her for CT simulation to be done around the beginning of April.  It was a pleasure meeting with her today. We discussed the risks, benefits, and side effects of radiotherapy. I recommend radiotherapy to the right breast to reduce her risk of locoregional recurrence by two-thirds.  We discussed that radiation would take approximately 4 weeks to complete. We spoke about acute effects including skin irritation and fatigue as well as much less common late effects including internal organ injury or irritation. We spoke about the latest technology that is used to minimize the risk of late effects for patients undergoing radiotherapy to the breast or chest  wall. No guarantees of treatment were given. The patient is enthusiastic about proceeding with treatment. I look forward to participating in the patient's care.   A total of 3 medically necessary complex treatment devices will be fabricated and supervised by me: 2 fields with MLCs for custom blocks to protect heart, and lungs;  and, a Vac-lok. MORE COMPLEX DEVICES MAY BE MADE IN DOSIMETRY FOR FIELD IN FIELD BEAMS FOR DOSE HOMOGENEITY.  I have requested : 3D Simulation which is medically necessary to give adequate dose to at risk tissues while sparing lungs and heart.  I have requested a DVH of the following structures: lungs, heart, lumpectomy cavity.    The patient will receive 40.05 Gy in 15 fractions to the right breast with 2 fields.  This will be followed by a boost.   __________________________________________   Eppie Gibson, MD   This document serves as a record of services personally performed by Eppie Gibson, MD. It was created on his behalf by Linward Natal, a trained medical scribe. The creation of this record is based on the scribe's personal observations and the provider's statements to them. This document has been checked and approved by the attending provider.

## 2017-06-21 ENCOUNTER — Encounter: Payer: Self-pay | Admitting: *Deleted

## 2017-07-10 ENCOUNTER — Ambulatory Visit
Admission: RE | Admit: 2017-07-10 | Discharge: 2017-07-10 | Disposition: A | Discharge status: Home / Self Care | Payer: BLUE CROSS/BLUE SHIELD | Source: Ambulatory Visit | Attending: Radiation Oncology | Admitting: Radiation Oncology

## 2017-07-10 DIAGNOSIS — Z510 Encounter for antineoplastic radiation therapy: Secondary | ICD-10-CM | POA: Insufficient documentation

## 2017-07-10 DIAGNOSIS — C50311 Malignant neoplasm of lower-inner quadrant of right female breast: Secondary | ICD-10-CM | POA: Diagnosis not present

## 2017-07-10 DIAGNOSIS — Z170 Estrogen receptor positive status [ER+]: Secondary | ICD-10-CM | POA: Insufficient documentation

## 2017-07-10 NOTE — Progress Notes (Signed)
  Radiation Oncology         (336) [phone] ________________________________  Name: MELODIE ASHWORTH MRN: [id number]  Date: 07/10/2017  DOB: Aug 18, 1955  SIMULATION AND TREATMENT PLANNING NOTE    Outpatient  DIAGNOSIS:     ICD-10-CM   1. Malignant neoplasm of lower-inner quadrant of right breast of female, estrogen receptor positive ([place]) C50.311    Z17.0     NARRATIVE:  The patient was brought to the [facility].  Identity was confirmed.  All relevant records and images related to the planned course of therapy were reviewed.  The patient freely provided informed written consent to proceed with treatment after reviewing the details related to the planned course of therapy. The consent form was witnessed and verified by the simulation staff.    Then, the patient was set-up in a stable reproducible supine position for radiation therapy with her ipsilateral arm over her head, and her upper body secured in a custom-made Vac-lok device.  CT images were obtained.  Surface markings were placed.  The CT images were loaded into the planning software.    TREATMENT PLANNING NOTE: Treatment planning then occurred.  The radiation prescription was entered and confirmed.     A total of 3 medically necessary complex treatment devices were fabricated and supervised by me: 2 fields with MLCs for custom blocks to protect heart, and lungs;  and, a Vac-lok. MORE COMPLEX DEVICES MAY BE MADE IN DOSIMETRY FOR FIELD IN FIELD BEAMS FOR DOSE HOMOGENEITY.  I have requested : 3D Simulation which is medically necessary to give adequate dose to at risk tissues while sparing lungs and heart.  I have requested a DVH of the following structures: lungs, heart, lumpectomy cavity.    The patient will receive 40.05 Gy in 15 fractions to the right breast with 2 tangential fields.   This will be followed by a boost.  Optical Surface Tracking Plan:  Since intensity modulated radiotherapy (IMRT) and 3D conformal  radiation treatment methods are predicated on accurate and precise positioning for treatment, intrafraction motion monitoring is medically necessary to ensure accurate and safe treatment delivery. The ability to quantify intrafraction motion without excessive ionizing radiation dose can only be performed with optical surface tracking. Accordingly, surface imaging offers the opportunity to obtain 3D measurements of patient position throughout IMRT and 3D treatments without excessive radiation exposure. I am ordering optical surface tracking for this patient's upcoming course of radiotherapy.  ________________________________   Reference:  Ursula Alert, J, et al. Surface imaging-based analysis of intrafraction motion for breast radiotherapy patients.Journal of [place], n. 6, nov. 2014. ISSN [id number].  Available at: <http://www.jacmp.org/index.php/jacmp/article/view/4957>.    -----------------------------------  Eppie Gibson, MD

## 2017-07-11 DIAGNOSIS — Z510 Encounter for antineoplastic radiation therapy: Secondary | ICD-10-CM | POA: Diagnosis not present

## 2017-07-11 DIAGNOSIS — Z170 Estrogen receptor positive status [ER+]: Secondary | ICD-10-CM | POA: Diagnosis not present

## 2017-07-11 DIAGNOSIS — C50311 Malignant neoplasm of lower-inner quadrant of right female breast: Secondary | ICD-10-CM | POA: Diagnosis not present

## 2017-07-17 ENCOUNTER — Ambulatory Visit
Admission: RE | Admit: 2017-07-17 | Discharge: 2017-07-17 | Disposition: A | Discharge status: Home / Self Care | Payer: BLUE CROSS/BLUE SHIELD | Source: Ambulatory Visit | Attending: Radiation Oncology | Admitting: Radiation Oncology

## 2017-07-17 DIAGNOSIS — C50311 Malignant neoplasm of lower-inner quadrant of right female breast: Secondary | ICD-10-CM | POA: Diagnosis not present

## 2017-07-17 DIAGNOSIS — Z510 Encounter for antineoplastic radiation therapy: Secondary | ICD-10-CM | POA: Diagnosis not present

## 2017-07-17 DIAGNOSIS — Z170 Estrogen receptor positive status [ER+]: Secondary | ICD-10-CM | POA: Diagnosis not present

## 2017-07-18 ENCOUNTER — Ambulatory Visit
Admission: RE | Admit: 2017-07-18 | Discharge: 2017-07-18 | Disposition: A | Discharge status: Home / Self Care | Payer: BLUE CROSS/BLUE SHIELD | Source: Ambulatory Visit | Attending: Radiation Oncology | Admitting: Radiation Oncology

## 2017-07-18 DIAGNOSIS — Z170 Estrogen receptor positive status [ER+]: Secondary | ICD-10-CM | POA: Diagnosis not present

## 2017-07-18 DIAGNOSIS — Z510 Encounter for antineoplastic radiation therapy: Secondary | ICD-10-CM | POA: Diagnosis not present

## 2017-07-18 DIAGNOSIS — C50311 Malignant neoplasm of lower-inner quadrant of right female breast: Secondary | ICD-10-CM | POA: Diagnosis not present

## 2017-07-19 ENCOUNTER — Ambulatory Visit
Admission: RE | Admit: 2017-07-19 | Discharge: 2017-07-19 | Disposition: A | Discharge status: Home / Self Care | Payer: BLUE CROSS/BLUE SHIELD | Source: Ambulatory Visit | Attending: Radiation Oncology | Admitting: Radiation Oncology

## 2017-07-19 DIAGNOSIS — C50311 Malignant neoplasm of lower-inner quadrant of right female breast: Secondary | ICD-10-CM | POA: Diagnosis not present

## 2017-07-19 DIAGNOSIS — Z170 Estrogen receptor positive status [ER+]: Secondary | ICD-10-CM | POA: Diagnosis not present

## 2017-07-19 DIAGNOSIS — Z510 Encounter for antineoplastic radiation therapy: Secondary | ICD-10-CM | POA: Diagnosis not present

## 2017-07-19 MED ORDER — ALRA NON-METALLIC DEODORANT (RAD-ONC)
1.0000 "application " | Freq: Once | TOPICAL | Status: AC
  Administered 2017-07-19: 1 via TOPICAL

## 2017-07-19 MED ORDER — RADIAPLEXRX EX GEL
Freq: Once | CUTANEOUS | Status: AC
  Administered 2017-07-19: 17:00:00 via TOPICAL

## 2017-07-19 NOTE — Progress Notes (Signed)
Pt here for patient teaching.  Pt given Radiation and You booklet, skin care instructions, Alra deodorant and Radiaplex gel.  Reviewed areas of pertinence such as fatigue and skin changes . Pt able to give teach back of to pat skin and use unscented/gentle soap,apply Radiaplex bid, avoid applying anything to skin within 4 hours of treatment, avoid wearing an under wire bra and to use an electric razor if they must shave. Pt demonstrated understanding and verbalizes understanding of information given and will contact nursing with any questions or concerns.         

## 2017-07-20 ENCOUNTER — Ambulatory Visit
Admission: RE | Admit: 2017-07-20 | Discharge: 2017-07-20 | Disposition: A | Discharge status: Home / Self Care | Payer: BLUE CROSS/BLUE SHIELD | Source: Ambulatory Visit | Attending: Radiation Oncology | Admitting: Radiation Oncology

## 2017-07-20 DIAGNOSIS — Z170 Estrogen receptor positive status [ER+]: Secondary | ICD-10-CM | POA: Diagnosis not present

## 2017-07-20 DIAGNOSIS — Z510 Encounter for antineoplastic radiation therapy: Secondary | ICD-10-CM | POA: Diagnosis not present

## 2017-07-20 DIAGNOSIS — C50311 Malignant neoplasm of lower-inner quadrant of right female breast: Secondary | ICD-10-CM | POA: Diagnosis not present

## 2017-07-21 ENCOUNTER — Ambulatory Visit
Admission: RE | Admit: 2017-07-21 | Discharge: 2017-07-21 | Disposition: A | Discharge status: Home / Self Care | Payer: BLUE CROSS/BLUE SHIELD | Source: Ambulatory Visit | Attending: Radiation Oncology | Admitting: Radiation Oncology

## 2017-07-21 DIAGNOSIS — Z510 Encounter for antineoplastic radiation therapy: Secondary | ICD-10-CM | POA: Diagnosis not present

## 2017-07-21 DIAGNOSIS — C50311 Malignant neoplasm of lower-inner quadrant of right female breast: Secondary | ICD-10-CM | POA: Diagnosis not present

## 2017-07-21 DIAGNOSIS — Z170 Estrogen receptor positive status [ER+]: Secondary | ICD-10-CM | POA: Diagnosis not present

## 2017-07-24 ENCOUNTER — Ambulatory Visit
Admission: RE | Admit: 2017-07-24 | Discharge: 2017-07-24 | Disposition: A | Discharge status: Home / Self Care | Payer: BLUE CROSS/BLUE SHIELD | Source: Ambulatory Visit | Attending: Radiation Oncology | Admitting: Radiation Oncology

## 2017-07-24 DIAGNOSIS — Z510 Encounter for antineoplastic radiation therapy: Secondary | ICD-10-CM | POA: Diagnosis not present

## 2017-07-24 DIAGNOSIS — Z170 Estrogen receptor positive status [ER+]: Secondary | ICD-10-CM | POA: Diagnosis not present

## 2017-07-24 DIAGNOSIS — C50311 Malignant neoplasm of lower-inner quadrant of right female breast: Secondary | ICD-10-CM | POA: Diagnosis not present

## 2017-07-25 ENCOUNTER — Ambulatory Visit
Admission: RE | Admit: 2017-07-25 | Discharge: 2017-07-25 | Disposition: A | Discharge status: Home / Self Care | Payer: BLUE CROSS/BLUE SHIELD | Source: Ambulatory Visit | Attending: Radiation Oncology | Admitting: Radiation Oncology

## 2017-07-25 DIAGNOSIS — Z510 Encounter for antineoplastic radiation therapy: Secondary | ICD-10-CM | POA: Diagnosis not present

## 2017-07-25 DIAGNOSIS — C50311 Malignant neoplasm of lower-inner quadrant of right female breast: Secondary | ICD-10-CM | POA: Diagnosis not present

## 2017-07-25 DIAGNOSIS — Z170 Estrogen receptor positive status [ER+]: Secondary | ICD-10-CM | POA: Diagnosis not present

## 2017-07-26 ENCOUNTER — Ambulatory Visit
Admission: RE | Admit: 2017-07-26 | Discharge: 2017-07-26 | Disposition: A | Discharge status: Home / Self Care | Payer: BLUE CROSS/BLUE SHIELD | Source: Ambulatory Visit | Attending: Radiation Oncology | Admitting: Radiation Oncology

## 2017-07-26 DIAGNOSIS — Z170 Estrogen receptor positive status [ER+]: Secondary | ICD-10-CM | POA: Diagnosis not present

## 2017-07-26 DIAGNOSIS — C50311 Malignant neoplasm of lower-inner quadrant of right female breast: Secondary | ICD-10-CM | POA: Diagnosis not present

## 2017-07-26 DIAGNOSIS — Z510 Encounter for antineoplastic radiation therapy: Secondary | ICD-10-CM | POA: Diagnosis not present

## 2017-07-27 ENCOUNTER — Ambulatory Visit
Admission: RE | Admit: 2017-07-27 | Discharge: 2017-07-27 | Disposition: A | Discharge status: Home / Self Care | Payer: BLUE CROSS/BLUE SHIELD | Source: Ambulatory Visit | Attending: Radiation Oncology | Admitting: Radiation Oncology

## 2017-07-27 DIAGNOSIS — Z510 Encounter for antineoplastic radiation therapy: Secondary | ICD-10-CM | POA: Diagnosis not present

## 2017-07-27 DIAGNOSIS — C50311 Malignant neoplasm of lower-inner quadrant of right female breast: Secondary | ICD-10-CM | POA: Diagnosis not present

## 2017-07-27 DIAGNOSIS — Z170 Estrogen receptor positive status [ER+]: Secondary | ICD-10-CM | POA: Diagnosis not present

## 2017-07-28 ENCOUNTER — Ambulatory Visit
Admission: RE | Admit: 2017-07-28 | Discharge: 2017-07-28 | Disposition: A | Discharge status: Home / Self Care | Payer: BLUE CROSS/BLUE SHIELD | Source: Ambulatory Visit | Attending: Radiation Oncology | Admitting: Radiation Oncology

## 2017-07-28 DIAGNOSIS — Z170 Estrogen receptor positive status [ER+]: Secondary | ICD-10-CM | POA: Diagnosis not present

## 2017-07-28 DIAGNOSIS — C50311 Malignant neoplasm of lower-inner quadrant of right female breast: Secondary | ICD-10-CM | POA: Diagnosis not present

## 2017-07-28 DIAGNOSIS — Z510 Encounter for antineoplastic radiation therapy: Secondary | ICD-10-CM | POA: Diagnosis not present

## 2017-07-31 ENCOUNTER — Ambulatory Visit
Admission: RE | Admit: 2017-07-31 | Discharge: 2017-07-31 | Disposition: A | Discharge status: Home / Self Care | Payer: BLUE CROSS/BLUE SHIELD | Source: Ambulatory Visit | Attending: Radiation Oncology | Admitting: Radiation Oncology

## 2017-07-31 DIAGNOSIS — Z510 Encounter for antineoplastic radiation therapy: Secondary | ICD-10-CM | POA: Diagnosis not present

## 2017-07-31 DIAGNOSIS — Z170 Estrogen receptor positive status [ER+]: Secondary | ICD-10-CM | POA: Diagnosis not present

## 2017-07-31 DIAGNOSIS — C50311 Malignant neoplasm of lower-inner quadrant of right female breast: Secondary | ICD-10-CM | POA: Diagnosis not present

## 2017-08-01 ENCOUNTER — Ambulatory Visit
Admission: RE | Admit: 2017-08-01 | Discharge: 2017-08-01 | Disposition: A | Discharge status: Home / Self Care | Payer: BLUE CROSS/BLUE SHIELD | Source: Ambulatory Visit | Attending: Radiation Oncology | Admitting: Radiation Oncology

## 2017-08-01 DIAGNOSIS — C50311 Malignant neoplasm of lower-inner quadrant of right female breast: Secondary | ICD-10-CM | POA: Diagnosis not present

## 2017-08-01 DIAGNOSIS — Z510 Encounter for antineoplastic radiation therapy: Secondary | ICD-10-CM | POA: Diagnosis not present

## 2017-08-01 DIAGNOSIS — Z170 Estrogen receptor positive status [ER+]: Secondary | ICD-10-CM | POA: Diagnosis not present

## 2017-08-02 ENCOUNTER — Ambulatory Visit
Admission: RE | Admit: 2017-08-02 | Discharge: 2017-08-02 | Disposition: A | Discharge status: Home / Self Care | Payer: BLUE CROSS/BLUE SHIELD | Source: Ambulatory Visit | Attending: Radiation Oncology | Admitting: Radiation Oncology

## 2017-08-02 DIAGNOSIS — Z170 Estrogen receptor positive status [ER+]: Secondary | ICD-10-CM | POA: Diagnosis not present

## 2017-08-02 DIAGNOSIS — Z510 Encounter for antineoplastic radiation therapy: Secondary | ICD-10-CM | POA: Diagnosis not present

## 2017-08-02 DIAGNOSIS — C50311 Malignant neoplasm of lower-inner quadrant of right female breast: Secondary | ICD-10-CM | POA: Diagnosis not present

## 2017-08-03 ENCOUNTER — Ambulatory Visit
Admission: RE | Admit: 2017-08-03 | Discharge: 2017-08-03 | Disposition: A | Discharge status: Home / Self Care | Payer: BLUE CROSS/BLUE SHIELD | Source: Ambulatory Visit | Attending: Radiation Oncology | Admitting: Radiation Oncology

## 2017-08-03 DIAGNOSIS — Z510 Encounter for antineoplastic radiation therapy: Secondary | ICD-10-CM | POA: Diagnosis not present

## 2017-08-03 DIAGNOSIS — Z170 Estrogen receptor positive status [ER+]: Secondary | ICD-10-CM | POA: Diagnosis not present

## 2017-08-03 DIAGNOSIS — C50311 Malignant neoplasm of lower-inner quadrant of right female breast: Secondary | ICD-10-CM | POA: Diagnosis not present

## 2017-08-04 ENCOUNTER — Ambulatory Visit
Admission: RE | Admit: 2017-08-04 | Discharge: 2017-08-04 | Disposition: A | Discharge status: Home / Self Care | Payer: BLUE CROSS/BLUE SHIELD | Source: Ambulatory Visit | Attending: Radiation Oncology | Admitting: Radiation Oncology

## 2017-08-04 DIAGNOSIS — C50311 Malignant neoplasm of lower-inner quadrant of right female breast: Secondary | ICD-10-CM | POA: Diagnosis not present

## 2017-08-04 DIAGNOSIS — Z170 Estrogen receptor positive status [ER+]: Secondary | ICD-10-CM | POA: Diagnosis not present

## 2017-08-04 DIAGNOSIS — Z510 Encounter for antineoplastic radiation therapy: Secondary | ICD-10-CM | POA: Diagnosis not present

## 2017-08-07 ENCOUNTER — Ambulatory Visit
Admission: RE | Admit: 2017-08-07 | Discharge: 2017-08-07 | Disposition: A | Discharge status: Home / Self Care | Payer: BLUE CROSS/BLUE SHIELD | Source: Ambulatory Visit | Attending: Radiation Oncology | Admitting: Radiation Oncology

## 2017-08-07 DIAGNOSIS — Z170 Estrogen receptor positive status [ER+]: Secondary | ICD-10-CM | POA: Diagnosis not present

## 2017-08-07 DIAGNOSIS — C50311 Malignant neoplasm of lower-inner quadrant of right female breast: Secondary | ICD-10-CM | POA: Diagnosis not present

## 2017-08-07 DIAGNOSIS — Z510 Encounter for antineoplastic radiation therapy: Secondary | ICD-10-CM | POA: Diagnosis not present

## 2017-08-07 MED ORDER — RADIAPLEXRX EX GEL
Freq: Once | CUTANEOUS | Status: AC
  Administered 2017-08-07: 17:00:00 via TOPICAL

## 2017-08-08 ENCOUNTER — Ambulatory Visit
Admission: RE | Admit: 2017-08-08 | Discharge: 2017-08-08 | Disposition: A | Discharge status: Home / Self Care | Payer: BLUE CROSS/BLUE SHIELD | Source: Ambulatory Visit | Attending: Radiation Oncology | Admitting: Radiation Oncology

## 2017-08-08 DIAGNOSIS — Z510 Encounter for antineoplastic radiation therapy: Secondary | ICD-10-CM | POA: Diagnosis not present

## 2017-08-08 DIAGNOSIS — Z170 Estrogen receptor positive status [ER+]: Secondary | ICD-10-CM | POA: Diagnosis not present

## 2017-08-08 DIAGNOSIS — C50311 Malignant neoplasm of lower-inner quadrant of right female breast: Secondary | ICD-10-CM | POA: Diagnosis not present

## 2017-08-09 ENCOUNTER — Ambulatory Visit
Admission: RE | Admit: 2017-08-09 | Discharge: 2017-08-09 | Disposition: A | Discharge status: Home / Self Care | Payer: BLUE CROSS/BLUE SHIELD | Source: Ambulatory Visit | Attending: Radiation Oncology | Admitting: Radiation Oncology

## 2017-08-09 DIAGNOSIS — C50311 Malignant neoplasm of lower-inner quadrant of right female breast: Secondary | ICD-10-CM | POA: Insufficient documentation

## 2017-08-09 DIAGNOSIS — Z510 Encounter for antineoplastic radiation therapy: Secondary | ICD-10-CM | POA: Diagnosis not present

## 2017-08-10 ENCOUNTER — Ambulatory Visit
Admission: RE | Admit: 2017-08-10 | Discharge: 2017-08-10 | Disposition: A | Discharge status: Home / Self Care | Payer: BLUE CROSS/BLUE SHIELD | Source: Ambulatory Visit | Attending: Radiation Oncology | Admitting: Radiation Oncology

## 2017-08-10 DIAGNOSIS — Z510 Encounter for antineoplastic radiation therapy: Secondary | ICD-10-CM | POA: Diagnosis not present

## 2017-08-10 DIAGNOSIS — C50311 Malignant neoplasm of lower-inner quadrant of right female breast: Secondary | ICD-10-CM | POA: Diagnosis not present

## 2017-08-11 ENCOUNTER — Ambulatory Visit
Admission: RE | Admit: 2017-08-11 | Discharge: 2017-08-11 | Disposition: A | Discharge status: Home / Self Care | Payer: BLUE CROSS/BLUE SHIELD | Source: Ambulatory Visit | Attending: Radiation Oncology | Admitting: Radiation Oncology

## 2017-08-11 DIAGNOSIS — Z510 Encounter for antineoplastic radiation therapy: Secondary | ICD-10-CM | POA: Diagnosis not present

## 2017-08-11 DIAGNOSIS — C50311 Malignant neoplasm of lower-inner quadrant of right female breast: Secondary | ICD-10-CM | POA: Diagnosis not present

## 2017-08-18 ENCOUNTER — Encounter: Payer: Self-pay | Admitting: Radiation Oncology

## 2017-08-18 NOTE — Progress Notes (Addendum)
  Radiation Oncology         (336) [phone] ________________________________  Name: Susan Christensen MRN: [id number]  Date: 08/18/2017  DOB: [birth year]  End of Treatment Note  Diagnosis:   StageIA  pT1a N0 M0 Right Breast UOQ Invasive Ductal Carcinoma, ER 90% / PR 95% / Her2 neg, Grade II     Indication for treatment:  Curative       Radiation treatment dates:   07/17/17 - 08/11/17  Site/dose: 1) 40.05 Gy directed to the right breast in 15 fractions 2) followed by boost of 10 Gy in 5 fractions.  Beams/energy:   1) 3D / 6X Photons 2) Electrons / 9 and 12 MeV  Narrative: The patient tolerated radiation treatment relatively well. She denies having any pain. She has skin discoloration to her right axilla, but not apparent peeling. She has some redness and radiation dermatitis to her right breast. She reports some itching to this area that is relieved with radiaplex. She also reports having mild fatigue.  Plan: The patient has completed radiation treatment. The patient will return to radiation oncology clinic for routine followup in one month. I advised them to call or return sooner if they have any questions or concerns related to their recovery or treatment.  -----------------------------------  Eppie Gibson, MD  This document serves as a record of services personally performed by Eppie Gibson MD. It was created on her behalf by Delton Coombes, a trained medical scribe. The creation of this record is based on the scribe's personal observations and the provider's statements to them.

## 2017-08-23 ENCOUNTER — Telehealth: Payer: Self-pay | Admitting: *Deleted

## 2017-08-23 NOTE — Telephone Encounter (Signed)
CALLED PATIENT TO ASK ABOUT RESCHEDULING FU ON 09-13-17 DUE TO DR. SQUIRE BEING IN THE BREAST CLINIC, RESCHEDULED FOR 09-12-17, LVM FOR A RETURN CALL

## 2017-09-07 ENCOUNTER — Encounter: Payer: Self-pay | Admitting: Radiation Oncology

## 2017-09-07 NOTE — Progress Notes (Signed)
Susan Christensen presents for follow up of radiation completed 08/11/17 to her Right Breast. She has improved fatigue and tells me that she has felt "back to normal" this past week and weekend. Her Right Breast has healed well. There is scarring from peeling present and hyperpigmentation. She is using vitamin E cream to these areas. She will see Dr. Jana Hakim tomorrow (09/13/17) and discuss antiestrogen therapy.   BP 127/64   Pulse 76   Temp 98.2 F (36.8 C)   Resp 18   Wt 156 lb 12.8 oz (71.1 kg)   SpO2 98%   BMI 29.63 kg/m    Wt Readings from Last 3 Encounters:  09/12/17 156 lb 12.8 oz (71.1 kg)  06/20/17 153 lb 9.6 oz (69.7 kg)  06/20/17 153 lb 11.2 oz (69.7 kg)

## 2017-09-12 ENCOUNTER — Other Ambulatory Visit: Payer: Self-pay

## 2017-09-12 ENCOUNTER — Encounter: Payer: Self-pay | Admitting: Radiation Oncology

## 2017-09-12 ENCOUNTER — Ambulatory Visit
Admission: RE | Admit: 2017-09-12 | Discharge: 2017-09-12 | Disposition: A | Discharge status: Home / Self Care | Payer: BLUE CROSS/BLUE SHIELD | Source: Ambulatory Visit | Attending: Radiation Oncology | Admitting: Radiation Oncology

## 2017-09-12 VITALS — BP 127/64 | HR 76 | Temp 98.2°F | Resp 18 | Wt 156.8 lb

## 2017-09-12 DIAGNOSIS — C50311 Malignant neoplasm of lower-inner quadrant of right female breast: Secondary | ICD-10-CM | POA: Diagnosis not present

## 2017-09-12 DIAGNOSIS — Z170 Estrogen receptor positive status [ER+]: Secondary | ICD-10-CM | POA: Diagnosis not present

## 2017-09-12 DIAGNOSIS — Z79899 Other long term (current) drug therapy: Secondary | ICD-10-CM | POA: Diagnosis not present

## 2017-09-12 DIAGNOSIS — Z923 Personal history of irradiation: Secondary | ICD-10-CM | POA: Diagnosis not present

## 2017-09-12 HISTORY — DX: Personal history of irradiation: Z92.3

## 2017-09-12 NOTE — Progress Notes (Signed)
[place]  [phone] [phone]     ID: Susan Christensen DOB: 01-21-1956  MR#: [id number]  HFS#:[id number]  Patient Care Team: Sinda Du, MD as PCP - General (Pulmonary Disease) Danie Binder, MD as Consulting Physician (Gastroenterology) Alania Overholt, Virgie Dad, MD as Consulting Physician (Oncology) Erroll Luna, MD as Consulting Physician (General Surgery) Eppie Gibson, MD as Attending Physician (Radiation Oncology) Madelin Headings, DO as Referring Physician (Optometry) OTHER MD:  CHIEF COMPLAINT: Estrogen receptor positive breast cancer  CURRENT TREATMENT: Tamoxifen   HISTORY OF CURRENT ILLNESS: From the original intake note:  Susan Christensen presented with a firm discolored right nipple. She underwent bilateral diagnostic mammography with tomography and right breast ultrasonography at [facility] on 03/30/2017 showing: breast density category C. On the right, an apparent mass enlarges and discolors the nipple. On the left, there are no discrete masses or areas of architectural distortion. Ultrasonography showed a small lobulated solid mass in the right breast at 4 o'clock lower inner quadrant measuring 0.4 x 0.3 x 0.4 cm, located 3 cm from the nipple. There is also an area of apparent architectural distortion in the upper outer right breast. Sonographic evaluation of the right axilla shows no abnormal or enlarged lymph nodes.   Accordingly on 04/06/2017 she proceeded to biopsy of the right breast mass in question. The pathology from this procedure showed ([id number]): At the 4 o'clock lower inner, intraductal papilloma. In the upper outer section of the breast: there was benign breast tissue with usual ductal epithelial hyperplasia. However this was felt to be discordant.   She underwent right lumpectomy and nipple biopsy ([id number]) on 05/30/2017 which found invasive lobular carcinoma, grade II which was focally present in the  superior margin. Prognostic indicators significant for: estrogen receptor, 90% positive and progesterone receptor, 95% positive, both with strong staining intensity. Proliferation marker Ki67 at 2%.  HER-2 was not amplified.  In the medial breast and right nipple, there was intraductal papilloma, no evidence of malignancy.  Subsequently she underwent right margin clearing and sentinel lymph node sampling ([id number]) on 06/14/2017.  The margin was clear and the single sentinel lymph node was negative.  The patient's subsequent history is as detailed below.  INTERVAL HISTORY: Anam returns today for follow up and treatment of her estrogen receptor positive breast cancer accompanied by her husband and daughter. She completed adjuvant radiation treatments on 08/11/2017. She had skin peeling and erythema. It used to itch, but this has resolved. She was advised to use vitamin D oil to help the skin heal.    REVIEW OF SYSTEMS: Harlei reports that for exercise, she walks 8,000 steps per day. 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 She also walks at church. She denies unusual headaches, visual changes, nausea, vomiting, or dizziness. There has been no unusual cough, phlegm production, or pleurisy. This been no change in bowel or bladder habits. She denies unexplained fatigue or unexplained weight loss, bleeding, rash, or fever. A detailed review of systems was otherwise stable.    PAST MEDICAL HISTORY: Past Medical History:  Diagnosis Date  . Asthma   . Cancer [facility])    Breast cancer  . Environmental and seasonal allergies   . Fatty liver   . GERD (gastroesophageal reflux disease)   . History of kidney stones   . History of radiation therapy 07/17/17- 08/11/17   40.05 Gy directed to the right breast in 15 fractions, followed by Boost 10 Gy in 5 fractions  .  Hypertension   . Papilloma of breast    right  Hyperlipidemia. Migraines at a [place] age.  She denies having emphysema or  heart issues.   PAST SURGICAL HISTORY: Past Surgical History:  Procedure Laterality Date  . BREAST BIOPSY Right 05/30/2017   Procedure: RIGHT NIPPLE BIOPSY;  Surgeon: Erroll Luna, MD;  Location: [place];  Service: General;  Laterality: Right;  . BREAST LUMPECTOMY WITH RADIOACTIVE SEED LOCALIZATION Right 05/30/2017   Procedure: RIGHT BREAST LUMPECTOMY WITH RADIOACTIVE SEED LOCALIZATION x2;  Surgeon: Erroll Luna, MD;  Location: [place];  Service: General;  Laterality: Right;  . BREAST LUMPECTOMY WITH SENTINEL LYMPH NODE BIOPSY Right 06/14/2017   Procedure: RE-EXCISION OF RIGHT BREAST LUMPECTOMY WITH RIGHT SENTINEL LYMPH NODE BX;  Surgeon: Erroll Luna, MD;  Location: [facility];  Service: General;  Laterality: Right;  . CESAREAN SECTION    . COLONOSCOPY  2009   Dr. Oneida Alar: normal. Screening in 10 years  . CYST REMOVAL HAND Left     FAMILY HISTORY Family History  Problem Relation Age of Onset  . Heart attack Father   . Heart disease Mother        has pacemaker  . Colon cancer Neg Hx   . Colon polyps Neg Hx   . Breast cancer Neg Hx   The patient's mother is 63 years old as of March 2019. The patient's father passed away due to heart issues at age 62. The patient has one brother. The patient's sister passed away in her 29's with down syndrome. The patient has a maternal aunt that was diagnosed with breast cancer in her 77's. The patient's cousin (who was the daughter of the aunt with breast cancer) passed away at a Perfect age due to melanoma.    GYNECOLOGIC HISTORY:  No LMP recorded. Patient is postmenopausal. Menarche: 62 years old Age at first live birth: 62 years old The patient is GxP1. Her LMP was around the age of 47 that lingered for a few years starting at 60.  She used oral contraceptive pills for about 6 years in order to regulate her periods. She denies having any issues.  She did not use HRT.    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 Her husband, Susan Christensen, is an Best boy. The patient's daughter, Susan Christensen, is age 19 and in college, studying web/media Agricultural consultant. The patient does not have grandchildren. 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    ADVANCED DIRECTIVES: [place] MAINTENANCE: Social History   Tobacco Use  . Smoking status: Never Smoker  . Smokeless tobacco: Never Used  Substance Use Topics  . Alcohol use: Yes    Comment: rare   . Drug use: No     Colonoscopy: about 9 years ago  PAP: not UTD  Bone density: no   Allergies  Allergen Reactions  . Shellfish Allergy Anaphylaxis    Current Outpatient Medications  Medication Sig Dispense Refill  . albuterol (VENTOLIN HFA) 108 (90 BASE) MCG/ACT inhaler Inhale 2 puffs into the lungs every 6 (six) hours as needed for wheezing or shortness of breath.    Marland Kitchen amLODipine (NORVASC) 10 MG tablet Take 10 mg by mouth daily.    . ARNUITY ELLIPTA 200 MCG/ACT AEPB Inhale 1 puff into the lungs every morning.     . benazepril (LOTENSIN) 20 MG tablet Take 20 mg by mouth daily.    . cholecalciferol (VITAMIN  D) 1000 UNITS tablet Take 1,000 Units by mouth daily.    . fluticasone (FLONASE) 50 MCG/ACT nasal spray Place 1 spray into both nostrils daily.     Marland Kitchen HYDROcodone-acetaminophen (NORCO/VICODIN) 5-325 MG tablet Take 1 tablet by mouth every 6 (six) hours as needed for moderate pain. (Patient not taking: Reported on 09/12/2017) 10 tablet 0  . ibuprofen (ADVIL,MOTRIN) 800 MG tablet Take 1 tablet (800 mg total) by mouth every 8 (eight) hours as needed. 30 tablet 0  . oxyCODONE (OXY IR/ROXICODONE) 5 MG immediate release tablet Take 1 tablet (5 mg total) by mouth every 6 (six) hours as needed for severe pain. (Patient not taking: Reported on 09/12/2017) 10 tablet 0  . Propylene Glycol (SYSTANE BALANCE OP) Apply 1 drop to eye every morning. Pt may use up to 4 times daily prn     No current  facility-administered medications for this visit.     OBJECTIVE: Middle-aged white woman in no acute distress  Vitals:   09/13/17 1400  BP: 140/62  Pulse: 68  Resp: 18  Temp: 98.6 F (37 C)  SpO2: 100%     Body mass index is 29.68 kg/m.   Wt Readings from Last 3 Encounters:  09/13/17 157 lb 1.6 oz (71.3 kg)  09/12/17 156 lb 12.8 oz (71.1 kg)  06/20/17 153 lb 9.6 oz (69.7 kg)      ECOG FS:1 - Symptomatic but completely ambulatory  Sclerae unicteric, EOMs intact Oropharynx clear and moist No cervical or supraclavicular adenopathy Lungs no rales or rhonchi Heart regular rate and rhythm Abd soft, nontender, positive bowel sounds MSK no focal spinal tenderness, no upper extremity lymphedema Neuro: nonfocal, well oriented, appropriate affect Breasts: The right breast is status post lumpectomy and radiation.  The skin is healing nicely, without obvious desquamation and only mild to moderate hyperpigmentation    LAB RESULTS:  CMP     Component Value Date/Time   NA 139 06/20/2017 1524   K 4.5 06/20/2017 1524   CL 101 06/20/2017 1524   CO2 28 06/20/2017 1524   GLUCOSE 142 (H) 06/20/2017 1524   BUN 14 06/20/2017 1524   CREATININE 1.00 06/20/2017 1524   CALCIUM 9.9 06/20/2017 1524   PROT 7.5 06/20/2017 1524   ALBUMIN 3.9 06/20/2017 1524   AST 23 06/20/2017 1524   ALT 35 06/20/2017 1524   ALKPHOS 87 06/20/2017 1524   BILITOT 0.8 06/20/2017 1524   GFRNONAA 60 (L) 06/20/2017 1524   GFRAA >60 06/20/2017 1524    No results found for: TOTALPROTELP, ALBUMINELP, A1GS, A2GS, BETS, BETA2SER, GAMS, MSPIKE, SPEI  No results found for: KPAFRELGTCHN, LAMBDASER, KAPLAMBRATIO  Lab Results  Component Value Date   WBC 12.3 (H) 06/20/2017   NEUTROABS 8.8 (H) 06/20/2017   HGB 14.6 06/20/2017   HCT 44.0 06/20/2017   MCV 91.9 06/20/2017   PLT 306 06/20/2017    '@LASTCHEMISTRY' @  No results found for: LABCA2  No components found for: BLTJQZ009  No results for input(s): INR in  the last 168 hours.  No results found for: LABCA2  No results found for: QZR007  No results found for: MAU633  No results found for: HLK562  No results found for: CA2729  No components found for: HGQUANT  No results found for: CEA1 / No results found for: CEA1   No results found for: AFPTUMOR  No results found for: CHROMOGRNA  No results found for: PSA1  No visits with results within 3 Day(s) from this visit.  Latest known visit  with results is:  Appointment on 06/20/2017  Component Date Value Ref Range Status  . Sodium 06/20/2017 139  136 - 145 mmol/L Final  . Potassium 06/20/2017 4.5  3.5 - 5.1 mmol/L Final  . Chloride 06/20/2017 101  98 - 109 mmol/L Final  . CO2 06/20/2017 28  22 - 29 mmol/L Final  . Glucose, Bld 06/20/2017 142* 70 - 140 mg/dL Final  . BUN 06/20/2017 14  7 - 26 mg/dL Final  . Creatinine, Ser 06/20/2017 1.00  0.60 - 1.10 mg/dL Final  . Calcium 06/20/2017 9.9  8.4 - 10.4 mg/dL Final  . Total Protein 06/20/2017 7.5  6.4 - 8.3 g/dL Final  . Albumin 06/20/2017 3.9  3.5 - 5.0 g/dL Final  . AST 06/20/2017 23  5 - 34 U/L Final  . ALT 06/20/2017 35  0 - 55 U/L Final  . Alkaline Phosphatase 06/20/2017 87  40 - 150 U/L Final  . Total Bilirubin 06/20/2017 0.8  0.2 - 1.2 mg/dL Final  . GFR calc non Af Amer 06/20/2017 60* >60 mL/min Final  . GFR calc Af Amer 06/20/2017 >60  >60 mL/min Final   Comment: (NOTE) The eGFR has been calculated using the CKD EPI equation. This calculation has not been validated in all clinical situations. eGFR's persistently <60 mL/min signify possible Chronic Kidney Disease.   Georgiann Hahn gap 06/20/2017 10  3 - 11 Final   Performed at [facility], [place] [address] [place], [place] [address]  . WBC 06/20/2017 12.3* 3.9 - 10.3 K/uL Final  . RBC 06/20/2017 4.79  3.70 - 5.45 MIL/uL Final  . Hemoglobin 06/20/2017 14.6  11.6 - 15.9 g/dL Final  . HCT 06/20/2017 44.0  34.8 - 46.6 % Final  . MCV 06/20/2017 91.9  79.5 -  101.0 fL Final  . MCH 06/20/2017 30.5  25.1 - 34.0 pg Final  . MCHC 06/20/2017 33.2  31.5 - 36.0 g/dL Final  . RDW 06/20/2017 12.7  11.2 - 14.5 % Final  . Platelets 06/20/2017 306  145 - 400 K/uL Final  . Neutrophils Relative % 06/20/2017 71  % Final  . Neutro Abs 06/20/2017 8.8* 1.5 - 6.5 K/uL Final  . Lymphocytes Relative 06/20/2017 19  % Final  . Lymphs Abs 06/20/2017 2.3  0.9 - 3.3 K/uL Final  . Monocytes Relative 06/20/2017 7  % Final  . Monocytes Absolute 06/20/2017 0.9  0.1 - 0.9 K/uL Final  . Eosinophils Relative 06/20/2017 2  % Final  . Eosinophils Absolute 06/20/2017 0.2  0.0 - 0.5 K/uL Final  . Basophils Relative 06/20/2017 1  % Final  . Basophils Absolute 06/20/2017 0.1  0.0 - 0.1 K/uL Final   Performed at [facility], [place] [address], [place], [place] [address]    (this displays the last labs from the last 3 days)  No results found for: TOTALPROTELP, ALBUMINELP, A1GS, A2GS, BETS, BETA2SER, GAMS, MSPIKE, SPEI (this displays SPEP labs)  No results found for: KPAFRELGTCHN, LAMBDASER, KAPLAMBRATIO (kappa/lambda light chains)  No results found for: HGBA, HGBA2QUANT, HGBFQUANT, HGBSQUAN (Hemoglobinopathy evaluation)   No results found for: LDH  No results found for: IRON, TIBC, IRONPCTSAT (Iron and TIBC)  No results found for: FERRITIN  Urinalysis No results found for: COLORURINE, APPEARANCEUR, LABSPEC, PHURINE, GLUCOSEU, HGBUR, BILIRUBINUR, KETONESUR, PROTEINUR, UROBILINOGEN, NITRITE, LEUKOCYTESUR   STUDIES: No results found.  ELIGIBLE FOR AVAILABLE RESEARCH PROTOCOL: no  ASSESSMENT: 62 y.o. [place], [place] woman status post right breast inner lower quadrant pT1a  pNX, stage IA invasive lobular carcinoma, grade 1, estrogen and progesterone receptor positive, HER-2 not amplified, with an MIB-1 of 2%  (1) positive margin cleared with additional surgery 06/14/2017; sentinel lymph node (single) was negative (pT1a pN0)  (2) adjuvant radiation  07/17/17 - 08/11/17 Site/dose:   40.05 Gy directed to the right breast in 15 fractions, followed by 10 Gy in 5 fractions.  (3) tamoxifen started 09/13/2017  PLAN: She has done very well with her local treatment and is now ready to consider systemic therapy.  We discussed the fact that as far as T1a breast cancer she is concerned, systemic therapy is optional.  The risk of systemic spread is low enough that benefits from antiestrogens are likely to be small  A more compelling argument for antiestrogens is her risk of developing another breast cancer in the future.  That risk approaches 1 %/year in women like her.  Antiestrogens would cut that in half.  We discussed the difference between tamoxifen and anastrozole in detail. She understands that anastrozole and the aromatase inhibitors in general work by blocking estrogen production. Accordingly vaginal dryness, decrease in bone density, and of course hot flashes can result. The aromatase inhibitors can also negatively affect the cholesterol profile, although that is a minor effect. One out of 5 women on aromatase inhibitors we will feel "old and achy". This arthralgia/myalgia syndrome, which resembles fibromyalgia clinically, does resolve with stopping the medications. Accordingly this is not a reason to not try an aromatase inhibitor but it is a frequent reason to stop it (in other words 20% of women will not be able to tolerate these medications).  Tamoxifen on the other hand does not block estrogen production. It does not "take away a woman's estrogen". It blocks the estrogen receptor in breast cells. Like anastrozole, it can also cause hot flashes. As opposed to anastrozole, tamoxifen has many estrogen-like effects. It is technically an estrogen receptor modulator. This means that in some tissues tamoxifen works like estrogen-- for example it helps strengthen the bones. It tends to improve the cholesterol profile. It can cause thickening of the  endometrial lining, and even endometrial polyps or rarely cancer of the uterus.(The risk of uterine cancer due to tamoxifen is one additional cancer per thousand women year). It can cause vaginal wetness or stickiness. It can cause blood clots through this estrogen-like effect--the risk of blood clots with tamoxifen is exactly the same as with birth control pills or hormone replacement.  Neither of these agents causes mood changes or weight gain, despite the popular belief that they can have these side effects. We have data from studies comparing either of these drugs with placebo, and in those cases the control group had the same amount of weight gain and depression as the group that took the drug.  Chiefly because of concerns regarding her high cholesterol, she decided for tamoxifen.  I have placed the prescription in and she will see me again early September to discuss tolerance.  If she tolerates it well the plan will be to continue tamoxifen for a total of 5 years  Incidentally she is considering red yeast rice for her high cholesterol since she cannot tolerate statins.  I have suggested she not do that until she has been on tamoxifen several months so that if any side effects develop we will not be confused.     Smokey Melott, Virgie Dad, MD  09/13/17 2:17 PM Medical Oncology and Hematology [facility] [place],  [place] [address] Tel. [phone]    Fax. [phone]  Alice Rieger, am acting as scribe for Chauncey Cruel MD.  I, Lurline Del MD, have reviewed the above documentation for accuracy and completeness, and I agree with the above.

## 2017-09-12 NOTE — Progress Notes (Signed)
Radiation Oncology         [phone] ________________________________  Name: Susan Christensen MRN: [id number]  Date: 09/12/2017  DOB: [birth year]  Follow-Up Visit Note  Outpatient  CC: Sinda Du, MD  Erroll Luna, MD  Diagnosis and Prior Radiotherapy:    ICD-10-CM   1. Malignant neoplasm of lower-inner quadrant of right breast of female, estrogen receptor positive ([place]) C50.311    Z17.0      StageIA  pT1a N0 M0 Right Breast UOQ Invasive Ductal Carcinoma, ER 90% / PR 95% / Her2 neg, Grade II  CHIEF COMPLAINT: Here for follow-up and surveillance of right breast cancer  Narrative:  The patient returns today for routine follow-up.  Doing well.    Using Vitamin E cream.  Sees med/onc tomorrow.                           ALLERGIES:  is allergic to shellfish allergy.  Meds: Current Outpatient Medications  Medication Sig Dispense Refill  . albuterol (VENTOLIN HFA) 108 (90 BASE) MCG/ACT inhaler Inhale 2 puffs into the lungs every 6 (six) hours as needed for wheezing or shortness of breath.    Marland Kitchen amLODipine (NORVASC) 10 MG tablet Take 10 mg by mouth daily.    . ARNUITY ELLIPTA 200 MCG/ACT AEPB Inhale 1 puff into the lungs every morning.     . benazepril (LOTENSIN) 20 MG tablet Take 20 mg by mouth daily.    . cholecalciferol (VITAMIN D) 1000 UNITS tablet Take 1,000 Units by mouth daily.    . fluticasone (FLONASE) 50 MCG/ACT nasal spray Place 1 spray into both nostrils daily.     Marland Kitchen ibuprofen (ADVIL,MOTRIN) 800 MG tablet Take 1 tablet (800 mg total) by mouth every 8 (eight) hours as needed. 30 tablet 0  . Propylene Glycol (SYSTANE BALANCE OP) Apply 1 drop to eye every morning. Pt may use up to 4 times daily prn    . HYDROcodone-acetaminophen (NORCO/VICODIN) 5-325 MG tablet Take 1 tablet by mouth every 6 (six) hours as needed for moderate pain. (Patient not taking: Reported on 09/12/2017) 10 tablet 0  . oxyCODONE (OXY IR/ROXICODONE) 5 MG immediate release tablet Take 1 tablet (5 mg  total) by mouth every 6 (six) hours as needed for severe pain. (Patient not taking: Reported on 09/12/2017) 10 tablet 0   No current facility-administered medications for this encounter.     Physical Findings: The patient is in no acute distress. Patient is alert and oriented.  weight is 156 lb 12.8 oz (71.1 kg). Her temperature is 98.2 F (36.8 C). Her blood pressure is 127/64 and her pulse is 76. Her respiration is 18 and oxygen saturation is 98%.      Breast: Satisfactory skin healing in radiotherapy fields. She has residual patches of hyper and hypo pigmentation over the right breast - the hypo pigmentation is related to rosacea.   General: Alert and oriented, in no acute distress   Psychiatric: Judgment and insight are intact. Affect is appropriate.     Lab Findings: Lab Results  Component Value Date   WBC 12.3 (H) 06/20/2017   HGB 14.6 06/20/2017   HCT 44.0 06/20/2017   MCV 91.9 06/20/2017   PLT 306 06/20/2017    Radiographic Findings: No results found.  Impression/Plan: Healing well from radiotherapy to the breast tissue.  Recommended skin care with topical Vitamin E Oil  for at least 2 more months for further healing.  I encouraged her  to continue with yearly mammography as appropriate (for intact breast tissue) and followup with medical oncology. I will see her back on an as-needed basis. I have encouraged her to call if she has any issues or concerns in the future. I wished her the very best.   _____________________________________   Eppie Gibson, MD  This document serves as a record of services personally performed by Eppie Gibson, MD. It was created on his behalf by Wilburn Mylar, a trained medical scribe. The creation of this record is based on the scribe's personal observations and the provider's statements to them. This document has been checked and approved by the attending provider.

## 2017-09-13 ENCOUNTER — Encounter: Payer: Self-pay | Admitting: Radiation Oncology

## 2017-09-13 ENCOUNTER — Inpatient Hospital Stay: Payer: BLUE CROSS/BLUE SHIELD | Attending: Oncology | Admitting: Oncology

## 2017-09-13 ENCOUNTER — Telehealth: Payer: Self-pay | Admitting: Oncology

## 2017-09-13 ENCOUNTER — Ambulatory Visit: Payer: Self-pay | Admitting: Radiation Oncology

## 2017-09-13 VITALS — BP 140/62 | HR 68 | Temp 98.6°F | Resp 18 | Ht 61.0 in | Wt 157.1 lb

## 2017-09-13 DIAGNOSIS — Z79899 Other long term (current) drug therapy: Secondary | ICD-10-CM | POA: Insufficient documentation

## 2017-09-13 DIAGNOSIS — C50311 Malignant neoplasm of lower-inner quadrant of right female breast: Secondary | ICD-10-CM | POA: Insufficient documentation

## 2017-09-13 DIAGNOSIS — Z79811 Long term (current) use of aromatase inhibitors: Secondary | ICD-10-CM | POA: Diagnosis not present

## 2017-09-13 DIAGNOSIS — Z170 Estrogen receptor positive status [ER+]: Secondary | ICD-10-CM | POA: Insufficient documentation

## 2017-09-13 NOTE — Telephone Encounter (Signed)
Gave avs and calendar ° °

## 2017-09-15 ENCOUNTER — Other Ambulatory Visit: Payer: Self-pay | Admitting: *Deleted

## 2017-09-15 MED ORDER — TAMOXIFEN CITRATE 20 MG PO TABS: 20.0000 mg | ORAL_TABLET | Freq: Every day | ORAL | 3 refills | Status: DC

## 2017-12-07 NOTE — Progress Notes (Signed)
[place]  [phone] [phone]     ID: CHARNELE SEMPLE DOB: [birth year]  MR#: [id number]  LPN#:[id number]  Patient Care Team: Sinda Du, MD as PCP - General (Pulmonary Disease) Danie Binder, MD as Consulting Physician (Gastroenterology) Suhani Stillion, Virgie Dad, MD as Consulting Physician (Oncology) Erroll Luna, MD as Consulting Physician (General Surgery) Eppie Gibson, MD as Attending Physician (Radiation Oncology) Madelin Headings, DO as Referring Physician (Optometry) OTHER MD:  CHIEF COMPLAINT: Estrogen receptor positive breast cancer  CURRENT TREATMENT: Tamoxifen   HISTORY OF CURRENT ILLNESS: From the original intake note:  [name] presented with a firm discolored right nipple. She underwent bilateral diagnostic mammography with tomography and right breast ultrasonography at [facility] on 03/30/2017 showing: breast density category C. On the right, an apparent mass enlarges and discolors the nipple. On the left, there are no discrete masses or areas of architectural distortion. Ultrasonography showed a small lobulated solid mass in the right breast at 4 o'clock lower inner quadrant measuring 0.4 x 0.3 x 0.4 cm, located 3 cm from the nipple. There is also an area of apparent architectural distortion in the upper outer right breast. Sonographic evaluation of the right axilla shows no abnormal or enlarged lymph nodes.   Accordingly on 04/06/2017 she proceeded to biopsy of the right breast mass in question. The pathology from this procedure showed ([id number]): At the 4 o'clock lower inner, intraductal papilloma. In the upper outer section of the breast: there was benign breast tissue with usual ductal epithelial hyperplasia. However this was felt to be discordant.   She underwent right lumpectomy and nipple biopsy ([id number]) on 05/30/2017 which found invasive lobular carcinoma, grade II which was focally present in the  superior margin. Prognostic indicators significant for: estrogen receptor, 90% positive and progesterone receptor, 95% positive, both with strong staining intensity. Proliferation marker Ki67 at 2%.  HER-2 was not amplified.  In the medial breast and right nipple, there was intraductal papilloma, no evidence of malignancy.  Subsequently she underwent right margin clearing and sentinel lymph node sampling ([id number]) on 06/14/2017.  The margin was clear and the single sentinel lymph node was negative.  The patient's subsequent history is as detailed below.  INTERVAL HISTORY: Moni returns today for follow up and treatment of her estrogen receptor positive breast cancer accompanied by her husband. She continues on tamoxifen, with good tolerance. She always felt "hot natured" prior to taking anastrozole, but now she feels hotter than usual. She goes to sleep with a fan. She doesn't have much trouble sleeping. She has some leg cramping at night occasionally. She denies issues with increased vaginal discharge.    REVIEW OF SYSTEMS: Wilbur reports that for exercise, she walks 8,000 steps per day. She denies unusual headaches, visual changes, nausea, vomiting, or dizziness. There has been no unusual cough, phlegm production, or pleurisy. There has been no change in bowel or bladder habits. She denies unexplained fatigue or unexplained weight loss, bleeding, rash, or fever. A detailed review of systems was otherwise stable.     PAST MEDICAL HISTORY: Past Medical History:  Diagnosis Date  . Asthma   . Cancer [facility])    Breast cancer  . Environmental and seasonal allergies   . Fatty liver   . GERD (gastroesophageal reflux disease)   . History of kidney stones   . History of radiation therapy 07/17/17- 08/11/17   40.05 Gy directed to the right breast in 15 fractions, followed by Boost 10 Gy in 5  fractions  . Hypertension   . Papilloma of breast    right  Hyperlipidemia. Migraines at a [place] age.   She denies having emphysema or heart issues.   PAST SURGICAL HISTORY: Past Surgical History:  Procedure Laterality Date  . BREAST BIOPSY Right 05/30/2017   Procedure: RIGHT NIPPLE BIOPSY;  Surgeon: Erroll Luna, MD;  Location: [place];  Service: General;  Laterality: Right;  . BREAST LUMPECTOMY WITH RADIOACTIVE SEED LOCALIZATION Right 05/30/2017   Procedure: RIGHT BREAST LUMPECTOMY WITH RADIOACTIVE SEED LOCALIZATION x2;  Surgeon: Erroll Luna, MD;  Location: [place];  Service: General;  Laterality: Right;  . BREAST LUMPECTOMY WITH SENTINEL LYMPH NODE BIOPSY Right 06/14/2017   Procedure: RE-EXCISION OF RIGHT BREAST LUMPECTOMY WITH RIGHT SENTINEL LYMPH NODE BX;  Surgeon: Erroll Luna, MD;  Location: [place];  Service: General;  Laterality: Right;  . CESAREAN SECTION    . COLONOSCOPY  2009   Dr. Oneida Alar: normal. Screening in 10 years  . CYST REMOVAL HAND Left     FAMILY HISTORY Family History  Problem Relation Age of Onset  . Heart attack Father   . Heart disease Mother        has pacemaker  . Colon cancer Neg Hx   . Colon polyps Neg Hx   . Breast cancer Neg Hx   The patient's mother is 31 years old as of March 2019. The patient's father passed away due to heart issues at age 81. The patient has one brother. The patient's sister passed away in her 8's with down syndrome. The patient has a maternal aunt that was diagnosed with breast cancer in her 20's. The patient's cousin (who was the daughter of the aunt with breast cancer) passed away at a [place] age due to melanoma.    GYNECOLOGIC HISTORY:  No LMP recorded. Patient is postmenopausal. Menarche: 62 years old Age at first live birth: 62 years old The patient is GxP1. Her LMP was around the age of 21 that lingered for a few years starting at 10.  She used oral contraceptive pills for about 6 years in order to regulate her periods. She denies having any issues.  She did not use  HRT.    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 Her husband, Leory Plowman, is an Best boy. The patient's daughter, Judson Roch, is age 27 and in college, studying web/media Agricultural consultant. The patient does not have grandchildren. 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    ADVANCED DIRECTIVES: [place] MAINTENANCE: Social History   Tobacco Use  . Smoking status: Never Smoker  . Smokeless tobacco: Never Used  Substance Use Topics  . Alcohol use: Yes    Comment: rare   . Drug use: No     Colonoscopy: about 9 years ago  PAP: not UTD  Bone density: no   Allergies  Allergen Reactions  . Shellfish Allergy Anaphylaxis    Current Outpatient Medications  Medication Sig Dispense Refill  . albuterol (VENTOLIN HFA) 108 (90 BASE) MCG/ACT inhaler Inhale 2 puffs into the lungs every 6 (six) hours as needed for wheezing or shortness of breath.    Marland Kitchen amLODipine (NORVASC) 10 MG tablet Take 10 mg by mouth daily.    . ARNUITY ELLIPTA 200 MCG/ACT AEPB Inhale 1 puff into the lungs every morning.     . benazepril (LOTENSIN) 20 MG tablet Take 20 mg by mouth daily.    Marland Kitchen  cholecalciferol (VITAMIN D) 1000 UNITS tablet Take 1,000 Units by mouth daily.    . fluticasone (FLONASE) 50 MCG/ACT nasal spray Place 1 spray into both nostrils daily.     Marland Kitchen HYDROcodone-acetaminophen (NORCO/VICODIN) 5-325 MG tablet Take 1 tablet by mouth every 6 (six) hours as needed for moderate pain. (Patient not taking: Reported on 09/12/2017) 10 tablet 0  . ibuprofen (ADVIL,MOTRIN) 800 MG tablet Take 1 tablet (800 mg total) by mouth every 8 (eight) hours as needed. 30 tablet 0  . oxyCODONE (OXY IR/ROXICODONE) 5 MG immediate release tablet Take 1 tablet (5 mg total) by mouth every 6 (six) hours as needed for severe pain. (Patient not taking: Reported on 09/12/2017) 10 tablet 0  . Propylene Glycol (SYSTANE BALANCE OP) Apply 1 drop to eye every morning. Pt may  use up to 4 times daily prn    . tamoxifen (NOLVADEX) 20 MG tablet Take 1 tablet (20 mg total) by mouth daily. 30 tablet 3   No current facility-administered medications for this visit.     OBJECTIVE: Middle-aged white woman who appears slightly flushed  Vitals:   12/12/17 1423  BP: (!) 148/65  Pulse: 76  Resp: 18  Temp: 98.5 F (36.9 C)  SpO2: 97%     Body mass index is 29.32 kg/m.   Wt Readings from Last 3 Encounters:  12/12/17 155 lb 3.2 oz (70.4 kg)  09/13/17 157 lb 1.6 oz (71.3 kg)  09/12/17 156 lb 12.8 oz (71.1 kg)      ECOG FS:1 - Symptomatic but completely ambulatory  Sclerae unicteric, pupils round and equal No cervical or supraclavicular adenopathy Lungs no rales or rhonchi Heart regular rate and rhythm Abd soft, nontender, positive bowel sounds MSK no focal spinal tenderness, no upper extremity lymphedema Neuro: nonfocal, well oriented, appropriate affect Breasts: The right breast has undergone lumpectomy and radiation.  The cosmetic result is excellent.  There is still some hyperpigmentation.  There is no evidence of residual or recurrent disease.  Both axillae are benign.  LAB RESULTS:  CMP     Component Value Date/Time   NA 139 06/20/2017 1524   K 4.5 06/20/2017 1524   CL 101 06/20/2017 1524   CO2 28 06/20/2017 1524   GLUCOSE 142 (H) 06/20/2017 1524   BUN 14 06/20/2017 1524   CREATININE 1.00 06/20/2017 1524   CALCIUM 9.9 06/20/2017 1524   PROT 7.5 06/20/2017 1524   ALBUMIN 3.9 06/20/2017 1524   AST 23 06/20/2017 1524   ALT 35 06/20/2017 1524   ALKPHOS 87 06/20/2017 1524   BILITOT 0.8 06/20/2017 1524   GFRNONAA 60 (L) 06/20/2017 1524   GFRAA >60 06/20/2017 1524    No results found for: TOTALPROTELP, ALBUMINELP, A1GS, A2GS, BETS, BETA2SER, GAMS, MSPIKE, SPEI  No results found for: KPAFRELGTCHN, LAMBDASER, KAPLAMBRATIO  Lab Results  Component Value Date   WBC 12.3 (H) 06/20/2017   NEUTROABS 8.8 (H) 06/20/2017   HGB 14.6 06/20/2017   HCT  44.0 06/20/2017   MCV 91.9 06/20/2017   PLT 306 06/20/2017    '@LASTCHEMISTRY' @  No results found for: LABCA2  No components found for: FHQRFX588  No results for input(s): INR in the last 168 hours.  No results found for: LABCA2  No results found for: TGP498  No results found for: YME158  No results found for: XEN407  No results found for: CA2729  No components found for: HGQUANT  No results found for: CEA1 / No results found for: CEA1   No results found  for: AFPTUMOR  No results found for: CHROMOGRNA  No results found for: PSA1  No visits with results within 3 Day(s) from this visit.  Latest known visit with results is:  Appointment on 06/20/2017  Component Date Value Ref Range Status  . Sodium 06/20/2017 139  136 - 145 mmol/L Final  . Potassium 06/20/2017 4.5  3.5 - 5.1 mmol/L Final  . Chloride 06/20/2017 101  98 - 109 mmol/L Final  . CO2 06/20/2017 28  22 - 29 mmol/L Final  . Glucose, Bld 06/20/2017 142* 70 - 140 mg/dL Final  . BUN 06/20/2017 14  7 - 26 mg/dL Final  . Creatinine, Ser 06/20/2017 1.00  0.60 - 1.10 mg/dL Final  . Calcium 06/20/2017 9.9  8.4 - 10.4 mg/dL Final  . Total Protein 06/20/2017 7.5  6.4 - 8.3 g/dL Final  . Albumin 06/20/2017 3.9  3.5 - 5.0 g/dL Final  . AST 06/20/2017 23  5 - 34 U/L Final  . ALT 06/20/2017 35  0 - 55 U/L Final  . Alkaline Phosphatase 06/20/2017 87  40 - 150 U/L Final  . Total Bilirubin 06/20/2017 0.8  0.2 - 1.2 mg/dL Final  . GFR calc non Af Amer 06/20/2017 60* >60 mL/min Final  . GFR calc Af Amer 06/20/2017 >60  >60 mL/min Final   Comment: (NOTE) The eGFR has been calculated using the CKD EPI equation. This calculation has not been validated in all clinical situations. eGFR's persistently <60 mL/min signify possible Chronic Kidney Disease.   Georgiann Hahn gap 06/20/2017 10  3 - 11 Final   Performed at [facility], [place] [address] [place], [place] [address]  . WBC 06/20/2017 12.3* 3.9 - 10.3 K/uL  Final  . RBC 06/20/2017 4.79  3.70 - 5.45 MIL/uL Final  . Hemoglobin 06/20/2017 14.6  11.6 - 15.9 g/dL Final  . HCT 06/20/2017 44.0  34.8 - 46.6 % Final  . MCV 06/20/2017 91.9  79.5 - 101.0 fL Final  . MCH 06/20/2017 30.5  25.1 - 34.0 pg Final  . MCHC 06/20/2017 33.2  31.5 - 36.0 g/dL Final  . RDW 06/20/2017 12.7  11.2 - 14.5 % Final  . Platelets 06/20/2017 306  145 - 400 K/uL Final  . Neutrophils Relative % 06/20/2017 71  % Final  . Neutro Abs 06/20/2017 8.8* 1.5 - 6.5 K/uL Final  . Lymphocytes Relative 06/20/2017 19  % Final  . Lymphs Abs 06/20/2017 2.3  0.9 - 3.3 K/uL Final  . Monocytes Relative 06/20/2017 7  % Final  . Monocytes Absolute 06/20/2017 0.9  0.1 - 0.9 K/uL Final  . Eosinophils Relative 06/20/2017 2  % Final  . Eosinophils Absolute 06/20/2017 0.2  0.0 - 0.5 K/uL Final  . Basophils Relative 06/20/2017 1  % Final  . Basophils Absolute 06/20/2017 0.1  0.0 - 0.1 K/uL Final   Performed at [facility], [place] [address] [place], [place] [address]    (this displays the last labs from the last 3 days)  No results found for: TOTALPROTELP, ALBUMINELP, A1GS, A2GS, BETS, BETA2SER, GAMS, MSPIKE, SPEI (this displays SPEP labs)  No results found for: KPAFRELGTCHN, LAMBDASER, KAPLAMBRATIO (kappa/lambda light chains)  No results found for: HGBA, HGBA2QUANT, HGBFQUANT, HGBSQUAN (Hemoglobinopathy evaluation)   No results found for: LDH  No results found for: IRON, TIBC, IRONPCTSAT (Iron and TIBC)  No results found for: FERRITIN  Urinalysis No results found for: COLORURINE, APPEARANCEUR, LABSPEC, PHURINE, GLUCOSEU, HGBUR, BILIRUBINUR, KETONESUR, PROTEINUR, UROBILINOGEN, NITRITE,  LEUKOCYTESUR   STUDIES: No results found.  ELIGIBLE FOR AVAILABLE RESEARCH PROTOCOL: no  ASSESSMENT: 62 y.o. [place], [place] woman status post right breast inner lower quadrant pT1a pNX, stage IA invasive lobular carcinoma, grade 1, estrogen and progesterone receptor positive,  HER-2 not amplified, with an MIB-1 of 2%  (1) positive margin cleared with additional surgery 06/14/2017; sentinel lymph node (single) was negative (pT1a pN0)  (2) adjuvant radiation 07/17/17 - 08/11/17 Site/dose:   40.05 Gy directed to the right breast in 15 fractions, followed by 10 Gy in 5 fractions.  (3) tamoxifen started 09/13/2017  (a) venlafaxine 37.5 mg XR added 12/12/2017  PLAN: [place] is doing well on tamoxifen except for the hot flashes.  We discussed options including venlafaxine, gabapentin, and clonidine.  We are going to go to very low dose of venlafaxine to start with.  I have asked her to call us after about a month and let us know how she is doing.  If that is good enough then we will simply continue.  Otherwise we will increase the venlafaxine to 75 mg at that time  She is going to have mammography in December.  She will return to see me in mid January.  At that point I expect we will start seeing her on a once a year basis  She knows to call for any other issues that may develop before the next visit.   Burdett Pinzon, Virgie Dad, MD  12/12/17 2:31 PM Medical Oncology and Hematology [facility] [address] [place], [place] [address] Tel. [phone]    Fax. [phone]  Alice Rieger, am acting as scribe for Chauncey Cruel MD.  I, Lurline Del MD, have reviewed the above documentation for accuracy and completeness, and I agree with the above.

## 2017-12-09 IMAGING — US US ABDOMEN COMPLETE
1 series · 13 of 25 positions shown · non-contrast
Comparison: Abdominal ultrasound October 10, 2007.

CLINICAL DATA: Follow-up of known fatty liver disease. Chronically
abnormal liver function studies, history of hyperlipidemia.

EXAM:
ABDOMEN ULTRASOUND COMPLETE

[Series 1: us abdomen complete · 0.15mm/px · 13 of 137 slices shown]
[im 1/137]
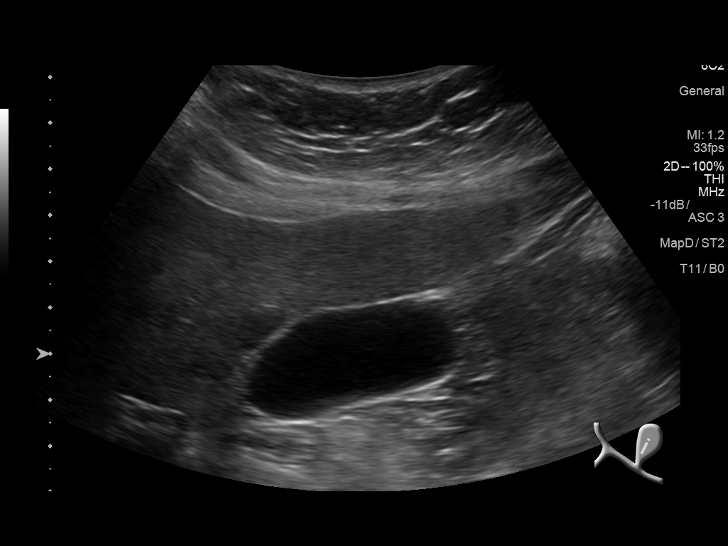
[im 12/137]
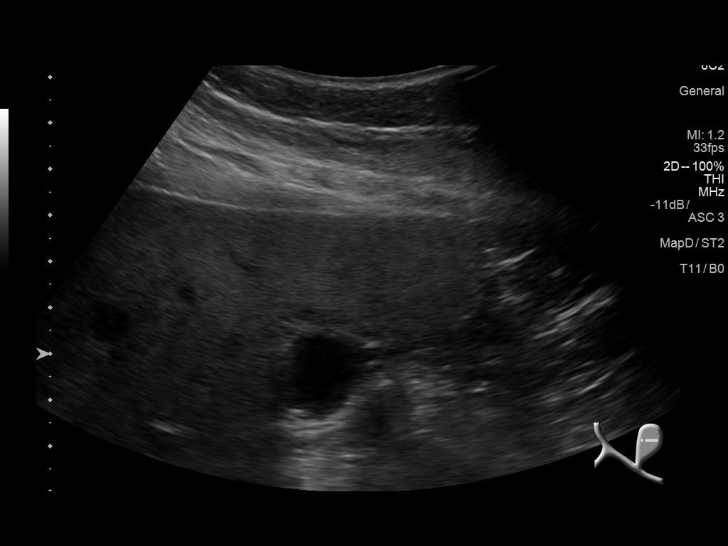
[im 23/137]
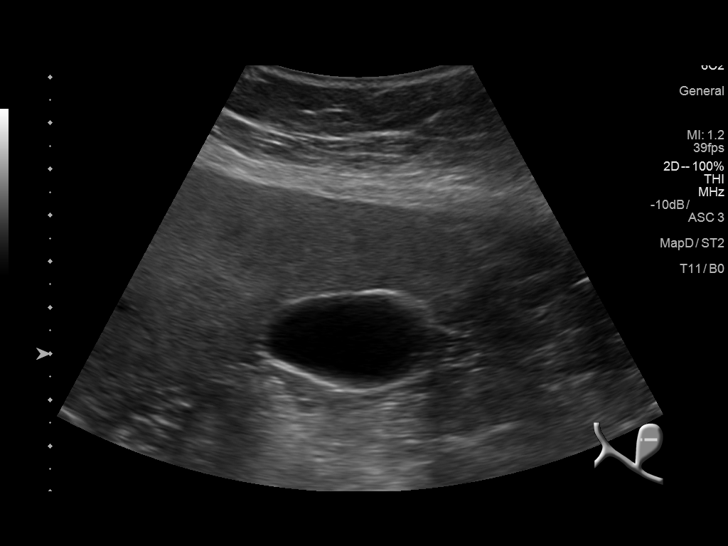
[im 35/137]
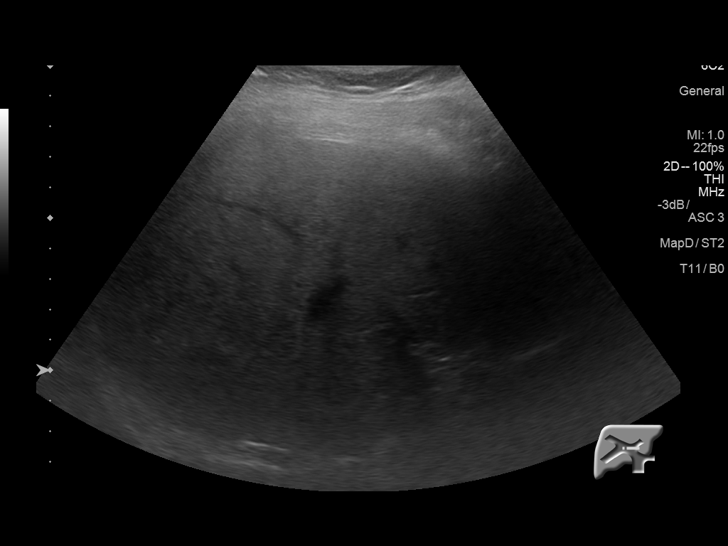
[im 46/137]
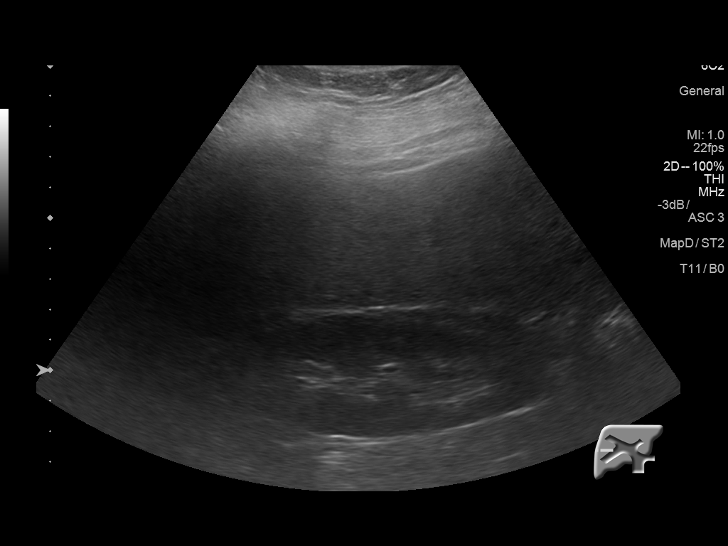
[im 57/137]
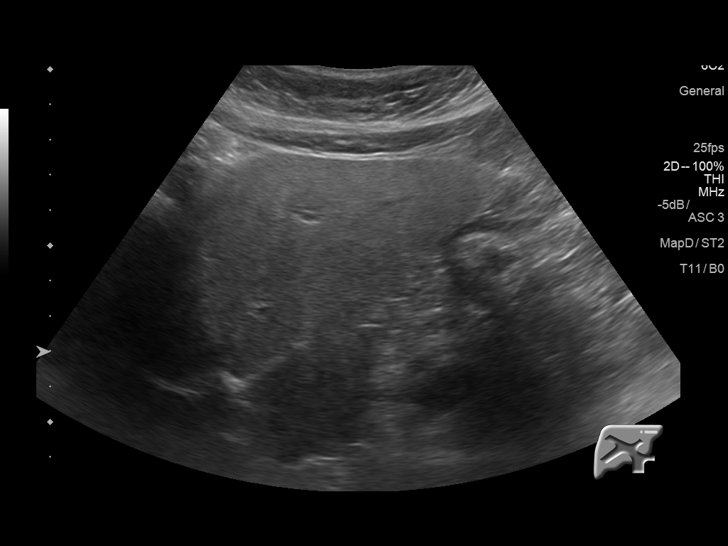
[im 69/137]
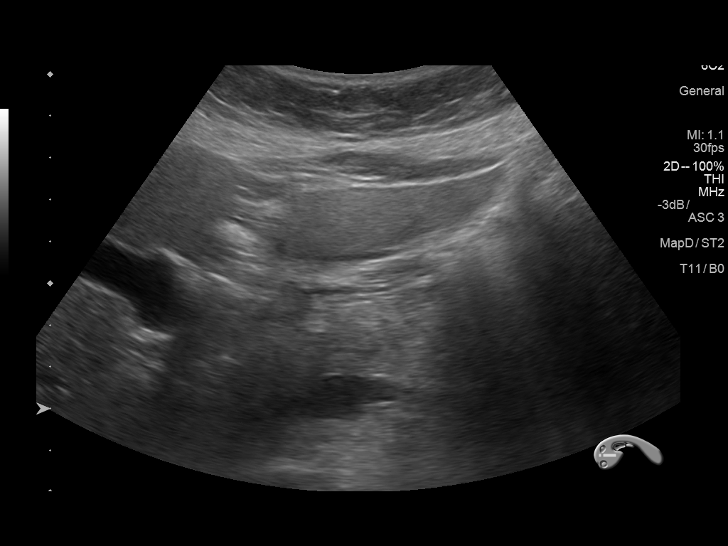
[im 80/137]
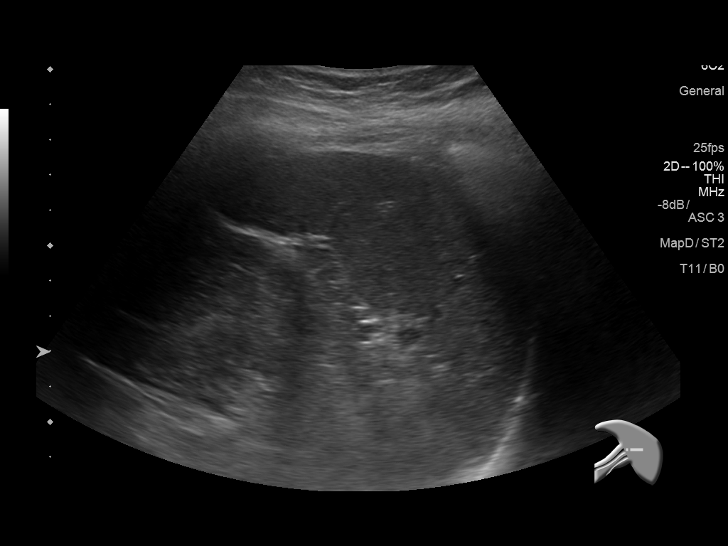
[im 91/137]
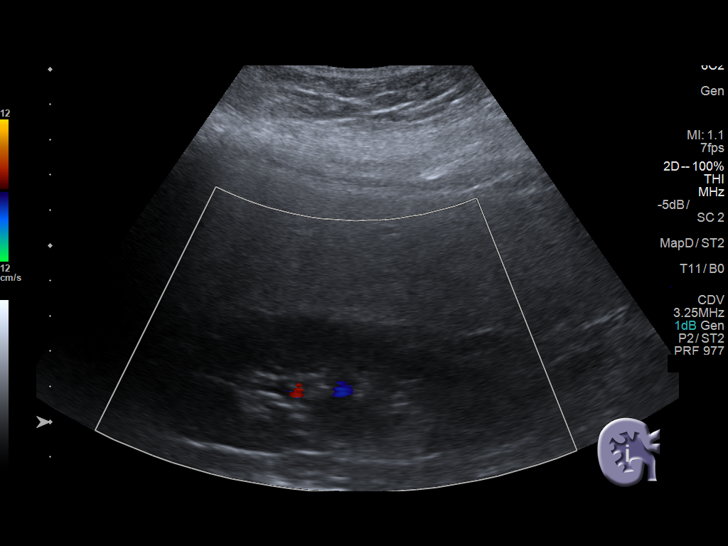
[im 103/137]
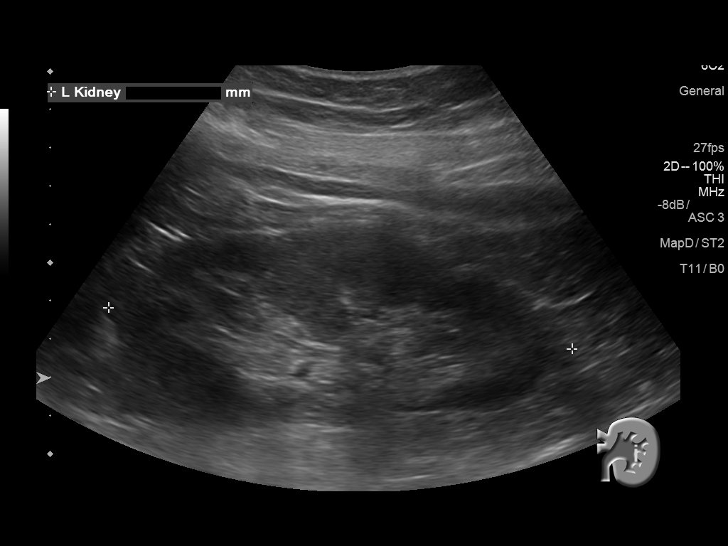
[im 114/137]
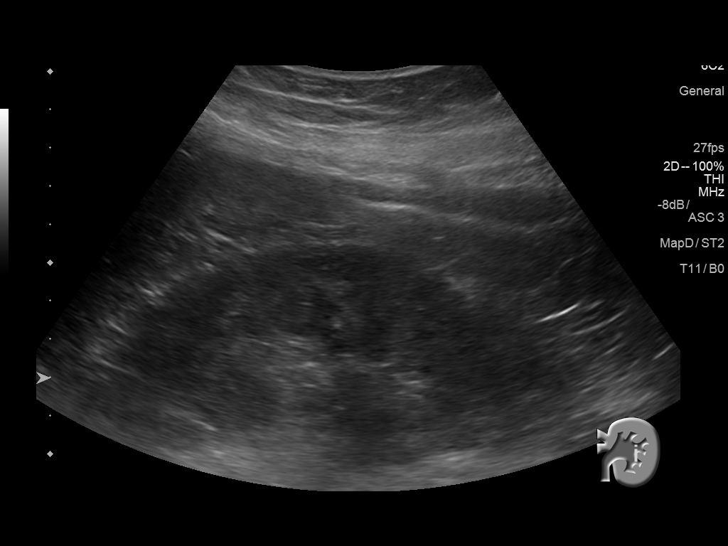
[im 125/137]
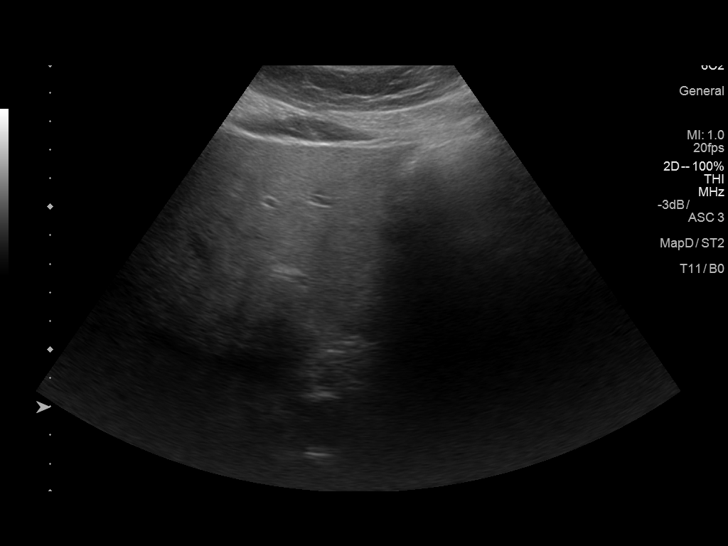
[im 137/137]
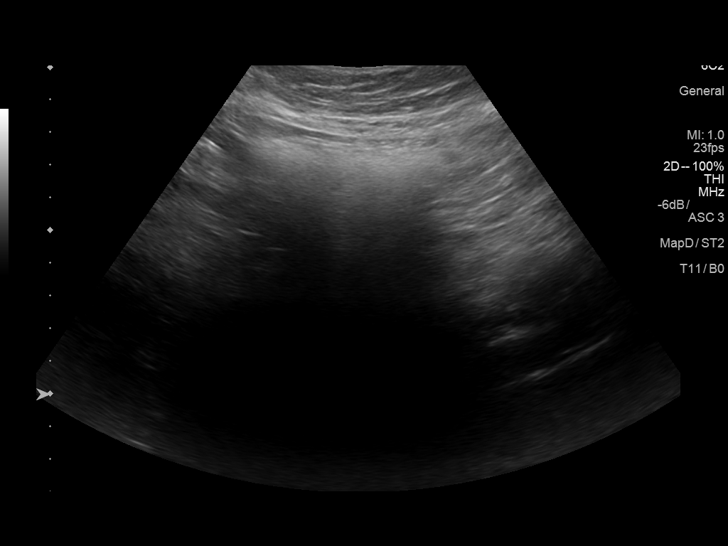

[13 of 25 positions shown; findings below may reference images not displayed]

FINDINGS: Gallbladder: No gallstones or wall thickening visualized. No
sonographic Murphy sign noted by sonographer.

Common bile duct: Diameter: 4.5 mm

Liver: The hepatic echotexture is increased diffusely. A focal area
of decreased echotexture is noted adjacent to the gallbladder. There
is no intrahepatic ductal dilation. The surface contour of the liver
appears smooth.

IVC: No abnormality visualized.

Pancreas: The pancreatic head and body are normal where visualized.
The pancreatic tail is largely obscured by bowel gas.

Spleen: The spleen is normal in size and echotexture.

Right Kidney: Length: 11.6 cm. Echogenicity within normal limits. No
mass or hydronephrosis visualized.

Left Kidney: Length: 12.2 cm. Echogenicity within normal limits. No
mass or hydronephrosis visualized.

Abdominal aorta: Visualization of the abdominal aorta is limited due
to bowel gas.

Other findings: There is no ascites.
IMPRESSION: Increased hepatic echotexture consistent with fatty infiltration. No
acute hepatic abnormality is observed. Normal appearance of the
gallbladder, common bile duct, and visualized portions of the
pancreas.

No acute intra-abdominal abnormality is observed.

## 2017-12-12 ENCOUNTER — Telehealth: Payer: Self-pay | Admitting: Oncology

## 2017-12-12 ENCOUNTER — Inpatient Hospital Stay: Payer: BLUE CROSS/BLUE SHIELD | Attending: Oncology | Admitting: Oncology

## 2017-12-12 VITALS — BP 148/65 | HR 76 | Temp 98.5°F | Resp 18 | Ht 61.0 in | Wt 155.2 lb

## 2017-12-12 DIAGNOSIS — Z923 Personal history of irradiation: Secondary | ICD-10-CM | POA: Diagnosis not present

## 2017-12-12 DIAGNOSIS — Z170 Estrogen receptor positive status [ER+]: Secondary | ICD-10-CM | POA: Diagnosis not present

## 2017-12-12 DIAGNOSIS — Z79810 Long term (current) use of selective estrogen receptor modulators (SERMs): Secondary | ICD-10-CM

## 2017-12-12 DIAGNOSIS — N951 Menopausal and female climacteric states: Secondary | ICD-10-CM | POA: Insufficient documentation

## 2017-12-12 DIAGNOSIS — C50311 Malignant neoplasm of lower-inner quadrant of right female breast: Secondary | ICD-10-CM | POA: Insufficient documentation

## 2017-12-12 MED ORDER — TAMOXIFEN CITRATE 20 MG PO TABS: 20.0000 mg | ORAL_TABLET | Freq: Every day | ORAL | 12 refills | Status: AC

## 2017-12-12 MED ORDER — VENLAFAXINE HCL ER 37.5 MG PO CP24: 37.5000 mg | ORAL_CAPSULE | Freq: Every day | ORAL | 4 refills | Status: DC

## 2017-12-12 NOTE — Telephone Encounter (Signed)
Pt requested to cancel dec SCP appt. Gave pt avs and calendar.

## 2017-12-25 ENCOUNTER — Telehealth: Payer: Self-pay | Admitting: *Deleted

## 2017-12-25 NOTE — Telephone Encounter (Signed)
This RN spoke with pt per her call stating she started the prescription of Effexor per visit earlier this month - " but then last week I developed facial swelling so I stopped it ".  Monchel states " my eyes got puffy first and then a couple of days later my whole face was swelling"   She stopped taking the medication by 9/13 and symptoms have now resolved.  She denies any SOB or other breathing issues per above onset.  Above discussed with plan for MD to be made aware upon his return to the office for other possible medications for noted hot flashes.  This RN informed pt above medication has been added to her allergy list.  No other needs at this time.

## 2018-01-01 DIAGNOSIS — Z853 Personal history of malignant neoplasm of breast: Secondary | ICD-10-CM | POA: Diagnosis not present

## 2018-01-05 ENCOUNTER — Other Ambulatory Visit: Payer: Self-pay | Admitting: Oncology

## 2018-01-23 ENCOUNTER — Encounter: Payer: Self-pay | Admitting: Gastroenterology

## 2018-01-29 ENCOUNTER — Other Ambulatory Visit: Payer: Self-pay | Admitting: Oncology

## 2018-03-16 ENCOUNTER — Encounter: Payer: BLUE CROSS/BLUE SHIELD | Admitting: Adult Health

## 2018-03-19 ENCOUNTER — Other Ambulatory Visit: Payer: Self-pay | Admitting: Oncology

## 2018-03-26 ENCOUNTER — Other Ambulatory Visit: Payer: Self-pay | Admitting: *Deleted

## 2018-03-27 ENCOUNTER — Other Ambulatory Visit: Payer: Self-pay | Admitting: *Deleted

## 2018-03-27 DIAGNOSIS — Z170 Estrogen receptor positive status [ER+]: Principal | ICD-10-CM

## 2018-03-27 DIAGNOSIS — C50311 Malignant neoplasm of lower-inner quadrant of right female breast: Secondary | ICD-10-CM

## 2018-04-24 ENCOUNTER — Ambulatory Visit (HOSPITAL_COMMUNITY): Payer: BLUE CROSS/BLUE SHIELD

## 2018-04-24 ENCOUNTER — Ambulatory Visit (HOSPITAL_COMMUNITY)
Admission: RE | Admit: 2018-04-24 | Discharge: 2018-04-24 | Disposition: A | Discharge status: Home / Self Care | Payer: BLUE CROSS/BLUE SHIELD | Source: Ambulatory Visit | Attending: Oncology | Admitting: Oncology

## 2018-04-24 DIAGNOSIS — C50311 Malignant neoplasm of lower-inner quadrant of right female breast: Secondary | ICD-10-CM | POA: Diagnosis not present

## 2018-04-24 DIAGNOSIS — R928 Other abnormal and inconclusive findings on diagnostic imaging of breast: Secondary | ICD-10-CM | POA: Diagnosis not present

## 2018-04-24 DIAGNOSIS — Z170 Estrogen receptor positive status [ER+]: Secondary | ICD-10-CM | POA: Diagnosis not present

## 2018-05-01 ENCOUNTER — Ambulatory Visit: Payer: BLUE CROSS/BLUE SHIELD | Admitting: Oncology

## 2018-05-01 ENCOUNTER — Other Ambulatory Visit: Payer: BLUE CROSS/BLUE SHIELD

## 2018-05-04 LAB — HM MAMMOGRAPHY

## 2018-05-08 NOTE — Progress Notes (Signed)
[place]  [phone] [phone]) [phone]     ID: [name] DOB: [birth year]  MR#: [id number]  DHR#:[id number]  Patient Care Team: Sinda Du, MD as PCP - General (Pulmonary Disease) Danie Binder, MD as Consulting Physician (Gastroenterology) Kiante Petrovich, Virgie Dad, MD as Consulting Physician (Oncology) Erroll Luna, MD as Consulting Physician (General Surgery) Eppie Gibson, MD as Attending Physician (Radiation Oncology) Madelin Headings, DO as Referring Physician (Optometry) OTHER MD:  CHIEF COMPLAINT: Estrogen receptor positive breast cancer  CURRENT TREATMENT: Tamoxifen   HISTORY OF CURRENT ILLNESS: From the original intake note:  Susan Christensen presented with a firm discolored right nipple. She underwent bilateral diagnostic mammography with tomography and right breast ultrasonography at [facility] on 03/30/2017 showing: breast density category C. On the right, an apparent mass enlarges and discolors the nipple. On the left, there are no discrete masses or areas of architectural distortion. Ultrasonography showed a small lobulated solid mass in the right breast at 4 o'clock lower inner quadrant measuring 0.4 x 0.3 x 0.4 cm, located 3 cm from the nipple. There is also an area of apparent architectural distortion in the upper outer right breast. Sonographic evaluation of the right axilla shows no abnormal or enlarged lymph nodes.   Accordingly on 04/06/2017 she proceeded to biopsy of the right breast mass in question. The pathology from this procedure showed ([id number]): At the 4 o'clock lower inner, intraductal papilloma. In the upper outer section of the breast: there was benign breast tissue with usual ductal epithelial hyperplasia. However this was felt to be discordant.   She underwent right lumpectomy and nipple biopsy ([id number]) on 05/30/2017 which found invasive lobular carcinoma, grade II which was focally present in the  superior margin. Prognostic indicators significant for: estrogen receptor, 90% positive and progesterone receptor, 95% positive, both with strong staining intensity. Proliferation marker Ki67 at 2%.  HER-2 was not amplified.  In the medial breast and right nipple, there was intraductal papilloma, no evidence of malignancy.  Subsequently she underwent right margin clearing and sentinel lymph node sampling ([id number]) on 06/14/2017.  The margin was clear and the single sentinel lymph node was negative.  The patient's subsequent history is as detailed below.   INTERVAL HISTORY: Susan Christensen returns today for follow-up and treatment of her estrogen receptor positive breast cancer. She is accompanied by her husband. 63  She continues on tamoxifen. She has hot flashes; the venlafaxine that she was using to treat her hot flashes caused her face to swell up after about a week, so that was discontinued.  Since her last visit here, she underwent diagnostic mammography with tomography on 04/24/2018 showing Breast Density Category B. There was no mammographic evidence for malignancy.    REVIEW OF SYSTEMS: For exercise, [name] usually walks between 8 and 10 thousand steps per day. The patient denies unusual headaches, visual changes, nausea, vomiting, or dizziness. There has been no unusual cough, phlegm production, or pleurisy. This been no change in bowel or bladder habits. The patient denies unexplained fatigue 63 or unexplained weight loss, bleeding, rash, or fever. A detailed review of systems was otherwise noncontributory.    PAST MEDICAL HISTORY: Past Medical History:  Diagnosis Date 63  . Asthma   . Cancer [facility])    Breast cancer  . Environmental and seasonal allergies   . Fatty liver   . GERD (gastroesophageal reflux disease)   . History of kidney stones   . History of radiation therapy 07/17/17- 08/11/17   40.05 Gy directed to  the right breast in 15 fractions, followed by Boost 10 Gy in 5 fractions  .  Hypertension   . Papilloma of breast    right  Hyperlipidemia. Migraines at a [place] age.  She denies having emphysema or heart issues.   PAST SURGICAL HISTORY: Past Surgical History:  Procedure Laterality Date  . BREAST BIOPSY Right 05/30/2017   Procedure: RIGHT NIPPLE BIOPSY;  Surgeon: Erroll Luna, MD;  Location: [place];  Service: General;  Laterality: Right;  . BREAST LUMPECTOMY WITH RADIOACTIVE SEED LOCALIZATION Right 05/30/2017   Procedure: RIGHT BREAST LUMPECTOMY WITH RADIOACTIVE SEED LOCALIZATION x2;  Surgeon: Erroll Luna, MD;  Location: [place];  Service: General;  Laterality: Right;  . BREAST LUMPECTOMY WITH SENTINEL LYMPH NODE BIOPSY Right 06/14/2017   Procedure: RE-EXCISION OF RIGHT BREAST LUMPECTOMY WITH RIGHT SENTINEL LYMPH NODE BX;  Surgeon: Erroll Luna, MD;  Location: [place];  Service: General;  Laterality: Right;  . CESAREAN SECTION    . COLONOSCOPY  2009   Dr. Oneida Alar: normal. Screening in 10 years  . CYST REMOVAL HAND Left     FAMILY HISTORY Family History  Problem Relation Age of Onset  . Heart attack Father   . Heart disease Mother        has pacemaker  . Colon cancer Neg Hx   . Colon polyps Neg Hx   . Breast cancer Neg Hx   The patient's mother is 63 years old as of March 2019. The patient's father passed away due to heart issues at age 67. The patient has one brother. 63 The patient's sister passed away in her 28's with down syndrome. The patient has a maternal aunt that was diagnosed with breast cancer in her 29's. The patient's cousin (who was the daughter of the aunt with breast cancer) passed away at a [place] age due to melanoma.    GYNECOLOGIC HISTORY:  No LMP recorded. Patient is postmenopausal. Menarche: 63 years old Age at first live birth: 63 years old The patient is GxP1. 63 Her LMP was around the age of 13 that lingered for a few years starting at 43.  She used oral contraceptive pills for about 6 years in  order to regulate her periods. She denies having any issues.  She did not use HRT.    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 Her husband, Leory Plowman, is an Best boy. The patient's daughter, Judson Roch, is age 10 and in college, studying web/media Agricultural consultant. The patient does not have grandchildren. 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    ADVANCED DIRECTIVES: [place] MAINTENANCE: Social History   Tobacco Use  . Smoking status: Never Smoker  . Smokeless tobacco: Never Used  Substance Use Topics  . Alcohol use: Yes    Comment: rare   . Drug use: No     Colonoscopy: about 9 years ago  PAP: not UTD  Bone density: no   Allergies  Allergen Reactions  . Effexor [Venlafaxine] Swelling  . Shellfish Allergy Anaphylaxis    Current Outpatient Medications  Medication Sig Dispense Refill  . albuterol (VENTOLIN HFA) 108 (90 BASE) MCG/ACT inhaler Inhale 2 puffs into the lungs every 6 (six) hours as needed for wheezing or shortness of breath.    Marland Kitchen amLODipine (NORVASC) 10 MG tablet Take 10 mg by mouth daily.    . ARNUITY ELLIPTA 200 MCG/ACT AEPB Inhale 1 puff into the lungs every morning.     Marland Kitchen  benazepril (LOTENSIN) 20 MG tablet Take 20 mg by mouth daily.    . cholecalciferol (VITAMIN D) 1000 UNITS tablet Take 1,000 Units by mouth daily.    . fluticasone (FLONASE) 50 MCG/ACT nasal spray Place 1 spray into both nostrils daily.     Marland Kitchen gabapentin (NEURONTIN) 300 MG capsule Take 1 capsule (300 mg total) by mouth at bedtime. 90 capsule 4  . Propylene Glycol (SYSTANE BALANCE OP) Apply 1 drop to eye every morning. Pt may use up to 4 times daily prn    . tamoxifen (NOLVADEX) 20 MG tablet Take 1 tablet (20 mg total) by mouth daily. 90 tablet 4   No current facility-administered medications for this visit.     OBJECTIVE: Middle-aged white woman in no acute distress  Vitals:   05/09/18 1258  BP: (!) 158/76    Pulse: 85  Resp: 18  Temp: 98.4 F (36.9 C)  SpO2: 99%     Body mass index is 28.68 kg/m.   Wt Readings from Last 3 Encounters:  05/09/18 151 lb 12.8 oz (68.9 kg)  12/12/17 155 lb 3.2 oz (70.4 kg)  09/13/17 157 lb 1.6 oz (71.3 kg)      ECOG FS:1 - Symptomatic but completely ambulatory  Sclerae unicteric, EOMs intact Oropharynx clear and moist No cervical or supraclavicular adenopathy Lungs no rales or rhonchi Heart regular rate and rhythm Abd soft, nontender, positive bowel sounds MSK no focal spinal tenderness, no upper extremity lymphedema Neuro: nonfocal, well oriented, appropriate affect Breasts: Right breast is status post lumpectomy followed by radiation with no evidence of disease recurrence.  The left breast is benign.  Both axillae are benign.  LAB RESULTS:  CMP     Component Value Date/Time   NA 140 05/09/2018 1252   K 3.9 05/09/2018 1252   CL 105 05/09/2018 1252   CO2 26 05/09/2018 1252   GLUCOSE 162 (H) 05/09/2018 1252   BUN 9 05/09/2018 1252   CREATININE 0.85 05/09/2018 1252   CALCIUM 9.0 05/09/2018 1252   PROT 7.3 05/09/2018 1252   ALBUMIN 3.8 05/09/2018 1252   AST 49 (H) 05/09/2018 1252   ALT 44 05/09/2018 1252   ALKPHOS 82 05/09/2018 1252   BILITOT 0.5 05/09/2018 1252   GFRNONAA >60 05/09/2018 1252   GFRAA >60 05/09/2018 1252    No results found for: TOTALPROTELP, ALBUMINELP, A1GS, A2GS, BETS, BETA2SER, GAMS, MSPIKE, SPEI  No results found for: KPAFRELGTCHN, LAMBDASER, KAPLAMBRATIO  Lab Results  Component Value Date   WBC 10.2 05/09/2018   NEUTROABS 7.2 05/09/2018   HGB 14.3 05/09/2018   HCT 42.5 05/09/2018   MCV 92.8 05/09/2018   PLT 258 05/09/2018    '@LASTCHEMISTRY' @  No results found for: LABCA2  No components found for: [id number]  No results for input(s): INR in the last 168 hours.  No results found for: LABCA2  No results found for: XHB716  No results found for: RCV893  No results found for: YBO175  No results found  for: CA2729  No components found for: HGQUANT  No results found for: CEA1 / No results found for: CEA1   No results found for: AFPTUMOR  No results found for: CHROMOGRNA  No results found for: PSA1  Appointment on 05/09/2018  Component Date Value Ref Range Status  . Sodium 05/09/2018 140  135 - 145 mmol/L Final  . Potassium 05/09/2018 3.9  3.5 - 5.1 mmol/L Final  . Chloride 05/09/2018 105  98 - 111 mmol/L Final  . CO2 05/09/2018 26  22 - 32 mmol/L Final  . Glucose, Bld 05/09/2018 162* 70 - 99 mg/dL Final  . BUN 05/09/2018 9  8 - 23 mg/dL Final  . Creatinine, Ser 05/09/2018 0.85  0.44 - 1.00 mg/dL Final  . Calcium 05/09/2018 9.0  8.9 - 10.3 mg/dL Final  . Total Protein 05/09/2018 7.3  6.5 - 8.1 g/dL Final  . Albumin 05/09/2018 3.8  3.5 - 5.0 g/dL Final  . AST 05/09/2018 49* 15 - 41 U/L Final  . ALT 05/09/2018 44  0 - 44 U/L Final  . Alkaline Phosphatase 05/09/2018 82  38 - 126 U/L Final  . Total Bilirubin 05/09/2018 0.5  0.3 - 1.2 mg/dL Final  . GFR calc non Af Amer 05/09/2018 >60  >60 mL/min Final  . GFR calc Af Amer 05/09/2018 >60  >60 mL/min Final  . Anion gap 05/09/2018 9  5 - 15 Final   Performed at [facility], [place] [address], [place], [place] [address]  . WBC 05/09/2018 10.2  4.0 - 10.5 K/uL Final  . RBC 05/09/2018 4.58  3.87 - 5.11 MIL/uL Final  . Hemoglobin 05/09/2018 14.3  12.0 - 15.0 g/dL Final  . HCT 05/09/2018 42.5  36.0 - 46.0 % Final  . MCV 05/09/2018 92.8  80.0 - 100.0 fL Final  . MCH 05/09/2018 31.2  26.0 - 34.0 pg Final  . MCHC 05/09/2018 33.6  30.0 - 36.0 g/dL Final  . RDW 05/09/2018 11.8  11.5 - 15.5 % Final  . Platelets 05/09/2018 258  150 - 400 K/uL Final  . nRBC 05/09/2018 0.0  0.0 - 0.2 % Final  . Neutrophils Relative % 05/09/2018 70  % Final  . Neutro Abs 05/09/2018 7.2  1.7 - 7.7 K/uL Final  . Lymphocytes Relative 05/09/2018 18  % Final  . Lymphs Abs 05/09/2018 1.9  0.7 - 4.0 K/uL Final  . Monocytes Relative 05/09/2018 7   % Final  . Monocytes Absolute 05/09/2018 0.7  0.1 - 1.0 K/uL Final  . Eosinophils Relative 05/09/2018 3  % Final  . Eosinophils Absolute 05/09/2018 0.3  0.0 - 0.5 K/uL Final  . Basophils Relative 05/09/2018 1  % Final  . Basophils Absolute 05/09/2018 0.1  0.0 - 0.1 K/uL Final  . Immature Granulocytes 05/09/2018 1  % Final  . Abs Immature Granulocytes 05/09/2018 0.09* 0.00 - 0.07 K/uL Final   Performed at [facility], [place] [address], [place], [place] [address]    (this displays the last labs from the last 3 days)  No results found for: TOTALPROTELP, ALBUMINELP, A1GS, A2GS, BETS, BETA2SER, GAMS, MSPIKE, SPEI (this displays SPEP labs)  No results found for: KPAFRELGTCHN, LAMBDASER, KAPLAMBRATIO (kappa/lambda light chains)  No results found for: HGBA, HGBA2QUANT, HGBFQUANT, HGBSQUAN (Hemoglobinopathy evaluation)   No results found for: LDH  No results found for: IRON, TIBC, IRONPCTSAT (Iron and TIBC)  No results found for: FERRITIN  Urinalysis No results found for: COLORURINE, APPEARANCEUR, LABSPEC, PHURINE, GLUCOSEU, HGBUR, BILIRUBINUR, KETONESUR, PROTEINUR, UROBILINOGEN, NITRITE, LEUKOCYTESUR   STUDIES: Mm Diag Breast Tomo Bilateral  Result Date: 04/24/2018 CLINICAL DATA:  63 year old female for annual bilateral mammograms. History of RIGHT breast cancer and lumpectomy in 2019. EXAM: DIGITAL DIAGNOSTIC BILATERAL MAMMOGRAM WITH CAD AND TOMO COMPARISON:  Previous exam(s). ACR Breast Density Category b: There are scattered areas of fibroglandular density. FINDINGS: 2D and 3D full field views of both breasts and a magnification view of the lumpectomy site demonstrate no suspicious mass, nonsurgical distortion or worrisome  calcifications. RIGHT breast and RIGHT axillary surgical changes are noted. Mammographic images were processed with CAD. IMPRESSION: No mammographic evidence of breast malignancy. RECOMMENDATION: Bilateral diagnostic mammogram in 1 year I have  discussed the findings and recommendations with the patient. Results were also provided in writing at the conclusion of the visit. If applicable, a reminder letter will be sent to the patient regarding the next appointment. BI-RADS CATEGORY  2: Benign. Electronically Signed   By: Margarette Canada M.D.   On: 04/24/2018 10:53    ELIGIBLE FOR AVAILABLE RESEARCH PROTOCOL: no  ASSESSMENT: 63 y.o. [place], [place] woman status post right breast inner lower quadrant pT1a pNX, stage IA invasive lobular carcinoma, grade 1, estrogen and progesterone receptor positive, HER-2 not amplified, with an MIB-1 of 2%  (1) positive margin cleared with additional surgery 06/14/2017; sentinel lymph node (single) was negative (pT1a pN0)  (2) adjuvant radiation 07/17/17 - 08/11/17 Site/dose:   40.05 Gy directed to the right breast in 15 fractions, followed by 10 Gy in 5 fractions.  (3) tamoxifen started 09/13/2017  (a) venlafaxine 37.5 mg XR added 12/12/2017--discontinued due to side effects  (b) gabapentin 300 mg p.o. nightly added 05/09/2018   PLAN: [place] now just about a year out from definitive surgery for her breast cancer with no evidence of disease recurrence.  This is very favorable.  She is tolerating tamoxifen well and the plan will be to continue that a total of 5 years.  She is having some nighttime hot flashes.  The venlafaxine was helpful but she did not tolerated, as it made her face swell.  We are going to get gabapentin to try.  We did talk about the possible toxicities, side effects and complications of this agent.  She is due for colonoscopy and we are referring her to Dr. Laural Golden with that in mind.  Otherwise she will return to see me in 1 year, after her next mammography  She knows to call for any other issue that may develop before the next visit.  Chalese Peach, Virgie Dad, MD  05/09/18 1:58 PM Medical Oncology and Hematology [facility] [address] [place], [place]  [address] Tel. [phone]    Fax. [phone]  I, Jacqualyn Posey am acting as a Education administrator for Chauncey Cruel, MD.   I, Lurline Del MD, have reviewed the above documentation for accuracy and completeness, and I agree with the above.

## 2018-05-09 ENCOUNTER — Inpatient Hospital Stay: Payer: BLUE CROSS/BLUE SHIELD | Attending: Oncology

## 2018-05-09 ENCOUNTER — Encounter: Payer: Self-pay | Admitting: Oncology

## 2018-05-09 ENCOUNTER — Inpatient Hospital Stay: Payer: BLUE CROSS/BLUE SHIELD | Admitting: Oncology

## 2018-05-09 VITALS — BP 158/76 | HR 85 | Temp 98.4°F | Resp 18 | Ht 61.0 in | Wt 151.8 lb

## 2018-05-09 DIAGNOSIS — E7439 Other disorders of intestinal carbohydrate absorption: Secondary | ICD-10-CM

## 2018-05-09 DIAGNOSIS — Z803 Family history of malignant neoplasm of breast: Secondary | ICD-10-CM | POA: Insufficient documentation

## 2018-05-09 DIAGNOSIS — C50311 Malignant neoplasm of lower-inner quadrant of right female breast: Secondary | ICD-10-CM

## 2018-05-09 DIAGNOSIS — Z87442 Personal history of urinary calculi: Secondary | ICD-10-CM | POA: Insufficient documentation

## 2018-05-09 DIAGNOSIS — Z923 Personal history of irradiation: Secondary | ICD-10-CM | POA: Insufficient documentation

## 2018-05-09 DIAGNOSIS — Z79899 Other long term (current) drug therapy: Secondary | ICD-10-CM

## 2018-05-09 DIAGNOSIS — R232 Flushing: Secondary | ICD-10-CM | POA: Insufficient documentation

## 2018-05-09 DIAGNOSIS — I10 Essential (primary) hypertension: Secondary | ICD-10-CM

## 2018-05-09 DIAGNOSIS — Z79810 Long term (current) use of selective estrogen receptor modulators (SERMs): Secondary | ICD-10-CM

## 2018-05-09 DIAGNOSIS — Z170 Estrogen receptor positive status [ER+]: Secondary | ICD-10-CM | POA: Diagnosis not present

## 2018-05-09 LAB — CBC WITH DIFFERENTIAL/PLATELET
ABS IMMATURE GRANULOCYTES: 0.09 10*3/uL — AB (ref 0.00–0.07)
Basophils Absolute: 0.1 10*3/uL (ref 0.0–0.1)
Basophils Relative: 1 %
EOS ABS: 0.3 10*3/uL (ref 0.0–0.5)
Eosinophils Relative: 3 %
HEMATOCRIT: 42.5 % (ref 36.0–46.0)
Hemoglobin: 14.3 g/dL (ref 12.0–15.0)
IMMATURE GRANULOCYTES: 1 %
LYMPHS ABS: 1.9 10*3/uL (ref 0.7–4.0)
Lymphocytes Relative: 18 %
MCH: 31.2 pg (ref 26.0–34.0)
MCHC: 33.6 g/dL (ref 30.0–36.0)
MCV: 92.8 fL (ref 80.0–100.0)
MONOS PCT: 7 %
Monocytes Absolute: 0.7 10*3/uL (ref 0.1–1.0)
NEUTROS ABS: 7.2 10*3/uL (ref 1.7–7.7)
NEUTROS PCT: 70 %
Platelets: 258 10*3/uL (ref 150–400)
RBC: 4.58 MIL/uL (ref 3.87–5.11)
RDW: 11.8 % (ref 11.5–15.5)
WBC: 10.2 10*3/uL (ref 4.0–10.5)
nRBC: 0 % (ref 0.0–0.2)

## 2018-05-09 LAB — COMPREHENSIVE METABOLIC PANEL
ALBUMIN: 3.8 g/dL (ref 3.5–5.0)
ALK PHOS: 82 U/L (ref 38–126)
ALT: 44 U/L (ref 0–44)
ANION GAP: 9 (ref 5–15)
AST: 49 U/L — AB (ref 15–41)
BILIRUBIN TOTAL: 0.5 mg/dL (ref 0.3–1.2)
BUN: 9 mg/dL (ref 8–23)
CALCIUM: 9 mg/dL (ref 8.9–10.3)
CO2: 26 mmol/L (ref 22–32)
Chloride: 105 mmol/L (ref 98–111)
Creatinine, Ser: 0.85 mg/dL (ref 0.44–1.00)
GFR calc Af Amer: >60 mL/min (ref >60)
GFR calc non Af Amer: >60 mL/min (ref >60)
GLUCOSE: 162 mg/dL — AB (ref 70–99)
Potassium: 3.9 mmol/L (ref 3.5–5.1)
Sodium: 140 mmol/L (ref 135–145)
TOTAL PROTEIN: 7.3 g/dL (ref 6.5–8.1)

## 2018-05-09 MED ORDER — GABAPENTIN 300 MG PO CAPS: 300.0000 mg | ORAL_CAPSULE | Freq: Every day | ORAL | 4 refills | Status: DC

## 2018-05-09 MED ORDER — TAMOXIFEN CITRATE 20 MG PO TABS: 20.0000 mg | ORAL_TABLET | Freq: Every day | ORAL | 4 refills | Status: DC

## 2019-01-01 ENCOUNTER — Other Ambulatory Visit: Payer: Self-pay | Admitting: Oncology

## 2019-04-03 ENCOUNTER — Other Ambulatory Visit (HOSPITAL_COMMUNITY): Payer: Self-pay | Admitting: Oncology

## 2019-04-03 ENCOUNTER — Ambulatory Visit: Payer: BC Managed Care – PPO | Admitting: Family Medicine

## 2019-04-03 ENCOUNTER — Other Ambulatory Visit: Payer: Self-pay

## 2019-04-03 ENCOUNTER — Encounter: Payer: Self-pay | Admitting: Family Medicine

## 2019-04-03 VITALS — BP 140/70 | HR 106 | Temp 98.5°F | Ht 61.0 in | Wt 140.2 lb

## 2019-04-03 DIAGNOSIS — I10 Essential (primary) hypertension: Secondary | ICD-10-CM | POA: Diagnosis not present

## 2019-04-03 DIAGNOSIS — Z9889 Other specified postprocedural states: Secondary | ICD-10-CM

## 2019-04-03 DIAGNOSIS — Z853 Personal history of malignant neoplasm of breast: Secondary | ICD-10-CM

## 2019-04-03 DIAGNOSIS — C50311 Malignant neoplasm of lower-inner quadrant of right female breast: Secondary | ICD-10-CM

## 2019-04-03 DIAGNOSIS — Z129 Encounter for screening for malignant neoplasm, site unspecified: Secondary | ICD-10-CM

## 2019-04-03 DIAGNOSIS — E7439 Other disorders of intestinal carbohydrate absorption: Secondary | ICD-10-CM

## 2019-04-03 DIAGNOSIS — K760 Fatty (change of) liver, not elsewhere classified: Secondary | ICD-10-CM | POA: Diagnosis not present

## 2019-04-03 DIAGNOSIS — Z170 Estrogen receptor positive status [ER+]: Secondary | ICD-10-CM

## 2019-04-03 DIAGNOSIS — E785 Hyperlipidemia, unspecified: Secondary | ICD-10-CM

## 2019-04-03 DIAGNOSIS — Z0000 Encounter for general adult medical examination without abnormal findings: Secondary | ICD-10-CM | POA: Insufficient documentation

## 2019-04-03 LAB — POCT GLYCOSYLATED HEMOGLOBIN (HGB A1C): Hemoglobin A1C: 5.9 % — AB (ref 4.0–5.6)

## 2019-04-03 MED ORDER — ALBUTEROL SULFATE HFA 108 (90 BASE) MCG/ACT IN AERS: 2.0000 | INHALATION_SPRAY | Freq: Four times a day (QID) | RESPIRATORY_TRACT | 5 refills | Status: DC | PRN

## 2019-04-03 NOTE — Patient Instructions (Addendum)
Take benazepril in the morning and amlodipine at night  Fasting labwork   Check with insurance on coverage for pneumonia vaccine-high risk due to asthma

## 2019-04-03 NOTE — Progress Notes (Signed)
New Patient Office Visit  Subjective:  Patient ID: Susan Christensen, female    DOB: [birth year]  Age: 63 y.o. MRN: [id number]  CC:  Chief Complaint  Patient presents with  . Establish Care  . Medication Management    Inhaler other than Ventolin   asthma HPI Susan Christensen presents for HTN/Hyperlipidemia-stable-pt does not take statins-trial of red yeast rice Asthma-Pt states uses ventolin prn but especially at night-insurance no longer covering. No recent UC/admission for breathing concerns HTN-well controlled-amlodipine and lotensin taken long term. NO CAD. NO headaches. Pt take bp at home -has a cuff.  Pt with h/o breast CA-tamoxifen-mammograms yearly-oncology yearly-radiation Fatty liver-no alcohol use-drinks diet soft drinks  Past Medical History:  Diagnosis Date  . Allergy   . Asthma   . Cancer [facility])    Breast cancer  . Environmental and seasonal allergies   . Fatty liver   . GERD (gastroesophageal reflux disease)   . History of kidney stones   . History of radiation therapy 07/17/17- 08/11/17   40.05 Gy directed to the right breast in 15 fractions, followed by Boost 10 Gy in 5 fractions  . Hypertension   . Papilloma of breast    right    Past Surgical History:  Procedure Laterality Date  . BREAST BIOPSY Right 05/30/2017   Procedure: RIGHT NIPPLE BIOPSY;  Surgeon: Erroll Luna, MD;  Location: [place];  Service: General;  Laterality: Right;  . BREAST LUMPECTOMY WITH RADIOACTIVE SEED LOCALIZATION Right 05/30/2017   Procedure: RIGHT BREAST LUMPECTOMY WITH RADIOACTIVE SEED LOCALIZATION x2;  Surgeon: Erroll Luna, MD;  Location: [place];  Service: General;  Laterality: Right;  . BREAST LUMPECTOMY WITH SENTINEL LYMPH NODE BIOPSY Right 06/14/2017   Procedure: RE-EXCISION OF RIGHT BREAST LUMPECTOMY WITH RIGHT SENTINEL LYMPH NODE BX;  Surgeon: Erroll Luna, MD;  Location: [place];  Service: General;  Laterality: Right;  . CESAREAN  SECTION    . COLONOSCOPY  2009   Dr. Oneida Alar: normal. Screening in 10 years  . CYST REMOVAL HAND Left     Family History  Problem Relation Age of Onset  . Heart attack Father   . Heart disease Father   . Hypertension Father   . Heart disease Mother        has pacemaker  . Stroke Mother   . Colon cancer Neg Hx   . Colon polyps Neg Hx   . Breast cancer Neg Hx     Social History   Socioeconomic History  . Marital status: Married    Spouse name: Not on file  . Number of children: Not on file  . Years of education: Not on file  . Highest education level: Not on file  Occupational History  . Occupation: Network engineer    Comment: [employer]  . Smoking status: Never Smoker  . Smokeless tobacco: Never Used  Substance and Sexual Activity  . Alcohol use: Yes    Comment: rare   . Drug use: No  . Sexual activity: Not Currently  Other Topics Concern  . Not on file  Social History Narrative  . Not on file   Social Determinants of Health   Financial Resource Strain:   . Difficulty of Paying Living Expenses: Not on file  Food Insecurity:   . Worried About Charity fundraiser in the Last Year: Not on file  . Ran Out of Food in the Last Year: Not on file  Transportation Needs:   .  Lack of Transportation (Medical): Not on file  . Lack of Transportation (Non-Medical): Not on file  Physical Activity:   . Days of Exercise per Week: Not on file  . Minutes of Exercise per Session: Not on file  Stress:   . Feeling of Stress : Not on file  Social Connections:   . Frequency of Communication with Friends and Family: Not on file  . Frequency of Social Gatherings with Friends and Family: Not on file  . Attends Religious Services: Not on file  . Active Member of Clubs or Organizations: Not on file  . Attends Archivist Meetings: Not on file  . Marital Status: Not on file  Intimate Partner Violence:   . Fear of Current or Ex-Partner: Not on file  .  Emotionally Abused: Not on file  . Physically Abused: Not on file  . Sexually Abused: Not on file    ROS Review of Systems  Constitutional: Negative.   HENT: Negative.   Eyes: Negative.   Respiratory:       Asthma  Gastrointestinal: Negative.   Endocrine: Negative.        Elevated glucose-no h/o DM  Genitourinary: Negative.   Musculoskeletal: Negative.   Skin:       rosacea   Allergic/Immunologic: Positive for environmental allergies and food allergies.       Shell fish  Hematological: Negative.     Objective:   Today's Vitals: BP 140/70 (BP Location: Right Arm, Patient Position: Sitting, Cuff Size: Normal)   Pulse (!) 106   Temp 98.5 F (36.9 C) (Oral)   Ht 5\' 1"  (1.549 m)   Wt 140 lb 3.2 oz (63.6 kg)   SpO2 98%   BMI 26.49 kg/m   Physical Exam Vitals reviewed.  HENT:     Head: Normocephalic and atraumatic.  Eyes:     Conjunctiva/sclera: Conjunctivae normal.  Cardiovascular:     Rate and Rhythm: Normal rate and regular rhythm.     Pulses: Normal pulses.     Heart sounds: Normal heart sounds.  Pulmonary:     Breath sounds: Normal breath sounds.  Musculoskeletal:     Cervical back: Normal range of motion and neck supple.  Neurological:     Mental Status: She is alert and oriented to person, place, and time.  Psychiatric:        Mood and Affect: Mood normal.        Behavior: Behavior normal.     Assessment & Plan:  1. Hyperlipidemia, unspecified hyperlipidemia type Pt does not want statins-takes red yeast rice - CBC with Differential - Lipid Panel w/o Chol/HDL Ratio  2. Essential hypertension Continue amlodipine at night and Lotensin in the morning-labs pending-slightly elevated bp - CBC with Differential - TSH  3. Fatty liver Advised pt to no longer drink soda-pt does not drink alcohol, Do not take tylenol - CBC with Differential - COMPLETE METABOLIC PANEL WITH GFR  4. Glucose intolerance A1c 5.9%-d/w pt carb concerns-pt education given - CBC  with Differential - COMPLETE METABOLIC PANEL WITH GFR 5. Screening for cancer Colonoscopy 11 years ago - Ambulatory referral to Gastroenterology  6. Malignant neoplasm of lower-inner quadrant of right breast of female, estrogen receptor positive [facility]) Oncology following yearly, neurontin for hot flashes given by oncology Outpatient Encounter Medications as of 04/03/2019  Medication Sig  . albuterol (VENTOLIN HFA) 108 (90 BASE) MCG/ACT inhaler Inhale 2 puffs into the lungs every 6 (six) hours as needed for wheezing or shortness of breath.  Marland Kitchen  amLODipine (NORVASC) 10 MG tablet Take 10 mg by mouth daily.  . ARNUITY ELLIPTA 200 MCG/ACT AEPB Inhale 1 puff into the lungs every morning.   . benazepril (LOTENSIN) 20 MG tablet Take 20 mg by mouth daily.  . cholecalciferol (VITAMIN D) 1000 UNITS tablet Take 1,000 Units by mouth daily.  . fluticasone (FLONASE) 50 MCG/ACT nasal spray Place 1 spray into both nostrils daily.   Marland Kitchen gabapentin (NEURONTIN) 300 MG capsule Take 1 capsule (300 mg total) by mouth at bedtime.  Marland Kitchen Propylene Glycol (SYSTANE BALANCE OP) Apply 1 drop to eye every morning. Pt may use up to 4 times daily prn  . tamoxifen (NOLVADEX) 20 MG tablet TAKE 1 TABLET(20 MG) BY MOUTH DAILY   No facility-administered encounter medications on file as of 04/03/2019.    Follow-up:   Dimetrius Montfort Hannah Beat, MD

## 2019-04-08 ENCOUNTER — Encounter (INDEPENDENT_AMBULATORY_CARE_PROVIDER_SITE_OTHER): Payer: Self-pay | Admitting: *Deleted

## 2019-04-30 ENCOUNTER — Ambulatory Visit (HOSPITAL_COMMUNITY): Admission: RE | Admit: 2019-04-30 | Payer: BC Managed Care – PPO | Source: Ambulatory Visit

## 2019-04-30 ENCOUNTER — Ambulatory Visit (HOSPITAL_COMMUNITY)
Admission: RE | Admit: 2019-04-30 | Discharge: 2019-04-30 | Disposition: A | Discharge status: Home / Self Care | Payer: BC Managed Care – PPO | Source: Ambulatory Visit | Attending: Oncology | Admitting: Oncology

## 2019-04-30 ENCOUNTER — Other Ambulatory Visit: Payer: Self-pay

## 2019-04-30 ENCOUNTER — Ambulatory Visit (HOSPITAL_COMMUNITY): Payer: BC Managed Care – PPO

## 2019-04-30 DIAGNOSIS — Z9889 Other specified postprocedural states: Secondary | ICD-10-CM

## 2019-04-30 DIAGNOSIS — R928 Other abnormal and inconclusive findings on diagnostic imaging of breast: Secondary | ICD-10-CM | POA: Diagnosis not present

## 2019-04-30 DIAGNOSIS — Z853 Personal history of malignant neoplasm of breast: Secondary | ICD-10-CM | POA: Diagnosis not present

## 2019-05-04 ENCOUNTER — Other Ambulatory Visit: Payer: Self-pay | Admitting: Family Medicine

## 2019-05-12 NOTE — Progress Notes (Signed)
[place]  [phone] [phone]) [phone]     ID: Susan Christensen DOB: 11-14-1955  MR#: [id number]  LKT#:[id number]  Patient Care Team: Susan Hancock, MD as PCP - General (Family Medicine) Susan Christensen, Susan Dad, MD as Consulting Physician (Oncology) Susan Luna, MD as Consulting Physician (General Surgery) Susan Gibson, MD as Attending Physician (Radiation Oncology) Susan Headings, DO as Referring Physician (Optometry) OTHER MD:  CHIEF COMPLAINT: Estrogen receptor positive breast cancer  CURRENT TREATMENT: Tamoxifen   INTERVAL HISTORY: Susan Christensen returns today for follow-up of her estrogen receptor positive breast cancer accompanied by her husband Susan Christensen.  She continues on tamoxifen.  She tolerates this generally well.  She does not really have hot flashes so much as she feels hot all the time.  This is not new either.  She does not have vaginal wetness issues.  She obtains the drug at a good price.  Since her last visit, she underwent bilateral diagnostic mammography with tomography at [facility] on 04/30/2019 showing: breast density category B; no evidence of malignancy in either breast.    REVIEW OF SYSTEMS: Susan Christensen is working at [employer].  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  Susan Christensen does work for [employer].  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  There have been a few people with Covid but they do good contract tracing and everybody wears a mask all the time.  Susan Christensen started a low-carb diet last year and has lost 20 pounds.  She just got a treadmill for Christmas.  Detailed review of systems today was stable   HISTORY OF CURRENT ILLNESS: From the original intake note:  Susan Christensen presented with a firm discolored right nipple. She underwent bilateral diagnostic mammography with tomography and right breast ultrasonography at [facility] on 03/30/2017 showing: breast density category C. On  the right, an apparent mass enlarges and discolors the nipple. On the left, there are no discrete masses or areas of architectural distortion. Ultrasonography showed a small lobulated solid mass in the right breast at 4 o'clock lower inner quadrant measuring 0.4 x 0.3 x 0.4 cm, located 3 cm from the nipple. There is also an area of apparent architectural distortion in the upper outer right breast. Sonographic evaluation of the right axilla shows no abnormal or enlarged lymph nodes.   Accordingly on 04/06/2017 she proceeded to biopsy of the right breast mass in question. The pathology from this procedure showed ([id number]): At the 4 o'clock lower inner, intraductal papilloma. In the upper outer section of the breast: there was benign breast tissue with usual ductal epithelial hyperplasia. However this was felt to be discordant.   She underwent right lumpectomy and nipple biopsy ([id number]) on 05/30/2017 which found invasive lobular carcinoma, grade II which was focally present in the superior margin. Prognostic indicators significant for: estrogen receptor, 90% positive and progesterone receptor, 95% positive, both with strong staining intensity. Proliferation marker Ki67 at 2%.  HER-2 was not amplified.  In the medial breast and right nipple, there was intraductal papilloma, no evidence of malignancy.  Subsequently she underwent right margin clearing and sentinel lymph node sampling ([id number]) on 06/14/2017.  The margin was clear and the single sentinel lymph node was negative.  The patient's subsequent history is as detailed below.   PAST MEDICAL HISTORY: Past Medical History:  Diagnosis Date  . Allergy   . Asthma   . Cancer [facility])    Breast  cancer  . Environmental and seasonal allergies   . Fatty liver   . GERD (gastroesophageal reflux disease)   . History of kidney stones   . History of radiation therapy 07/17/17- 08/11/17   40.05 Gy directed to the right breast in 15 fractions, followed by  Boost 10 Gy in 5 fractions  . Hypertension   . Papilloma of breast    right  Hyperlipidemia. Migraines at a [place] age.  She denies having emphysema or heart issues.    PAST SURGICAL HISTORY: Past Surgical History:  Procedure Laterality Date  . BREAST BIOPSY Right 05/30/2017   Procedure: RIGHT NIPPLE BIOPSY;  Surgeon: Susan Luna, MD;  Location: [place];  Service: General;  Laterality: Right;  . BREAST LUMPECTOMY WITH RADIOACTIVE SEED LOCALIZATION Right 05/30/2017   Procedure: RIGHT BREAST LUMPECTOMY WITH RADIOACTIVE SEED LOCALIZATION x2;  Surgeon: Susan Luna, MD;  Location: [place];  Service: General;  Laterality: Right;  . BREAST LUMPECTOMY WITH SENTINEL LYMPH NODE BIOPSY Right 06/14/2017   Procedure: RE-EXCISION OF RIGHT BREAST LUMPECTOMY WITH RIGHT SENTINEL LYMPH NODE BX;  Surgeon: Susan Luna, MD;  Location: [place];  Service: General;  Laterality: Right;  . CESAREAN SECTION    . COLONOSCOPY  2009   Dr. One[place] Christensen: normal. Screening in 10 years  . CYST REMOVAL HAND Left     FAMILY HISTORY Family History  Problem Relation Age of Onset  . Heart attack Father   . Heart disease Father   . Hypertension Father   . Heart disease Mother        has pacemaker  . Stroke Mother   . Colon cancer Neg Hx   . Colon polyps Neg Hx   . Breast cancer Neg Hx   The patient's mother is 64 years old as of March 2019. The patient's father passed away due to heart issues at age 56. The patient has one brother. The patient's sister passed away in her 25's with down syndrome. The patient has a maternal aunt that was diagnosed with breast cancer in her 61's. The patient's cousin (who was the daughter of the aunt with breast cancer) passed away at a [place] age due to melanoma.    GYNECOLOGIC HISTORY:  No LMP recorded. Patient is postmenopausal. Menarche: 64 years old Age at first live birth: 64 years old The patient is GxP1. Her LMP was around the age of 68 that  lingered for a few years starting at 63.  She used oral contraceptive pills for about 6 years in order to regulate her periods. She denies having any issues.  She did not use HRT.    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 Her husband, Susan Christensen, is an Best boy. The patient's daughter, Susan Christensen, is age 94 and in college, studying web/media Agricultural consultant. The patient does not have grandchildren. 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    ADVANCED DIRECTIVES: [place] MAINTENANCE: Social History   Tobacco Use  . Smoking status: Never Smoker  . Smokeless tobacco: Never Used  Substance Use Topics  . Alcohol use: Yes    Comment: rare   . Drug use: No     Colonoscopy: about 9 years ago  PAP: not UTD  Bone density: no   Allergies  Allergen Reactions  . Effexor [Venlafaxine] Swelling  . Shellfish Allergy Anaphylaxis    Current Outpatient Medications  Medication Sig Dispense Refill  . albuterol (  PROAIR HFA) 108 (90 Base) MCG/ACT inhaler Inhale 2 puffs into the lungs every 6 (six) hours as needed for wheezing or shortness of breath. 18 g 5  . amLODipine (NORVASC) 10 MG tablet Take 10 mg by mouth daily.    . ARNUITY ELLIPTA 200 MCG/ACT AEPB Inhale 1 puff into the lungs every morning.     . benazepril (LOTENSIN) 20 MG tablet TAKE 1 TABLET BY MOUTH ONCE DAILY 30 tablet 0  . cholecalciferol (VITAMIN D) 1000 UNITS tablet Take 1,000 Units by mouth daily.    . fluticasone (FLONASE) 50 MCG/ACT nasal spray Place 1 spray into both nostrils daily.     Marland Kitchen gabapentin (NEURONTIN) 300 MG capsule Take 1 capsule (300 mg total) by mouth at bedtime. 90 capsule 4  . Propylene Glycol (SYSTANE BALANCE OP) Apply 1 drop to eye every morning. Pt may use up to 4 times daily prn    . tamoxifen (NOLVADEX) 20 MG tablet Take 1 tablet (20 mg total) by mouth daily. 90 tablet 4   No current facility-administered medications for this  visit.    OBJECTIVE: Middle-aged white woman who appears stated age  26:   05/13/19 1514  BP: (!) 144/65  Pulse: 87  Resp: 18  Temp: 98.5 F (36.9 C)  SpO2: 96%     Body mass index is 26.23 kg/m.   Wt Readings from Last 3 Encounters:  05/13/19 138 lb 12.8 oz (63 kg)  04/03/19 140 lb 3.2 oz (63.6 kg)  05/09/18 151 lb 12.8 oz (68.9 kg)      ECOG FS:1 - Symptomatic but completely ambulatory  Sclerae unicteric, EOMs intact Wearing a mask No cervical or supraclavicular adenopathy Lungs no rales or rhonchi Heart regular rate and rhythm Abd soft, nontender, positive bowel sounds MSK no focal spinal tenderness, no upper extremity lymphedema Neuro: nonfocal, well oriented, appropriate affect Breasts: The right breast is status post lumpectomy and radiation.  The cosmetic result is good.  There is no evidence of disease recurrence.  Left breast is benign.  Both axillae are benign.   LAB RESULTS:  CMP     Component Value Date/Time   NA 141 05/13/2019 1456   K 3.9 05/13/2019 1456   CL 107 05/13/2019 1456   CO2 24 05/13/2019 1456   GLUCOSE 84 05/13/2019 1456   BUN 16 05/13/2019 1456   CREATININE 0.81 05/13/2019 1456   CALCIUM 8.6 (L) 05/13/2019 1456   PROT 7.0 05/13/2019 1456   ALBUMIN 4.0 05/13/2019 1456   AST 19 05/13/2019 1456   ALT 19 05/13/2019 1456   ALKPHOS 63 05/13/2019 1456   BILITOT 0.4 05/13/2019 1456   GFRNONAA >60 05/13/2019 1456   GFRAA >60 05/13/2019 1456    No results found for: TOTALPROTELP, ALBUMINELP, A1GS, A2GS, BETS, BETA2SER, GAMS, MSPIKE, SPEI  No results found for: KPAFRELGTCHN, LAMBDASER, KAPLAMBRATIO  Lab Results  Component Value Date   WBC 10.5 05/13/2019   NEUTROABS 7.1 05/13/2019   HGB 15.0 05/13/2019   HCT 45.0 05/13/2019   MCV 93.4 05/13/2019   PLT 290 05/13/2019   No results found for: LABCA2  No components found for: ZOXWRU045  No results for input(s): INR in the last 168 hours.  No results found for: LABCA2  No  results found for: WUJ811  No results found for: BJY782  No results found for: NFA213  No results found for: CA2729  No components found for: HGQUANT  No results found for: CEA1 / No results found for: CEA1  No results found for: AFPTUMOR  No results found for: [place]  No results found for: PSA1  Appointment on 05/13/2019  Component Date Value Ref Range Status  . Sodium 05/13/2019 141  135 - 145 mmol/L Final  . Potassium 05/13/2019 3.9  3.5 - 5.1 mmol/L Final  . Chloride 05/13/2019 107  98 - 111 mmol/L Final  . CO2 05/13/2019 24  22 - 32 mmol/L Final  . Glucose, Bld 05/13/2019 84  70 - 99 mg/dL Final  . BUN 05/13/2019 16  8 - 23 mg/dL Final  . Creatinine, Ser 05/13/2019 0.81  0.44 - 1.00 mg/dL Final  . Calcium 05/13/2019 8.6* 8.9 - 10.3 mg/dL Final  . Total Protein 05/13/2019 7.0  6.5 - 8.1 g/dL Final  . Albumin 05/13/2019 4.0  3.5 - 5.0 g/dL Final  . AST 05/13/2019 19  15 - 41 U/L Final  . ALT 05/13/2019 19  0 - 44 U/L Final  . Alkaline Phosphatase 05/13/2019 63  38 - 126 U/L Final  . Total Bilirubin 05/13/2019 0.4  0.3 - 1.2 mg/dL Final  . GFR calc non Af Amer 05/13/2019 >60  >60 mL/min Final  . GFR calc Af Amer 05/13/2019 >60  >60 mL/min Final  . Anion gap 05/13/2019 10  5 - 15 Final   Performed at [facility], [place] [address] [place], [place] [address]  . WBC 05/13/2019 10.5  4.0 - 10.5 K/uL Final  . RBC 05/13/2019 4.82  3.87 - 5.11 MIL/uL Final  . Hemoglobin 05/13/2019 15.0  12.0 - 15.0 g/dL Final  . HCT 05/13/2019 45.0  36.0 - 46.0 % Final  . MCV 05/13/2019 93.4  80.0 - 100.0 fL Final  . MCH 05/13/2019 31.1  26.0 - 34.0 pg Final  . MCHC 05/13/2019 33.3  30.0 - 36.0 g/dL Final  . RDW 05/13/2019 11.9  11.5 - 15.5 % Final  . Platelets 05/13/2019 290  150 - 400 K/uL Final  . nRBC 05/13/2019 0.0  0.0 - 0.2 % Final  . Neutrophils Relative % 05/13/2019 69  % Final  . Neutro Abs 05/13/2019 7.1  1.7 - 7.7 K/uL Final  . Lymphocytes Relative  05/13/2019 21  % Final  . Lymphs Abs 05/13/2019 2.2  0.7 - 4.0 K/uL Final  . Monocytes Relative 05/13/2019 7  % Final  . Monocytes Absolute 05/13/2019 0.8  0.1 - 1.0 K/uL Final  . Eosinophils Relative 05/13/2019 2  % Final  . Eosinophils Absolute 05/13/2019 0.2  0.0 - 0.5 K/uL Final  . Basophils Relative 05/13/2019 1  % Final  . Basophils Absolute 05/13/2019 0.1  0.0 - 0.1 K/uL Final  . Immature Granulocytes 05/13/2019 0  % Final  . Abs Immature Granulocytes 05/13/2019 0.03  0.00 - 0.07 K/uL Final   Performed at [facility], [place] [address], [place], [place] [address]    (this displays the last labs from the last 3 days)  No results found for: TOTALPROTELP, ALBUMINELP, A1GS, A2GS, BETS, BETA2SER, GAMS, MSPIKE, SPEI (this displays SPEP labs)  No results found for: KPAFRELGTCHN, LAMBDASER, KAPLAMBRATIO (kappa/lambda light chains)  No results found for: HGBA, HGBA2QUANT, HGBFQUANT, HGBSQUAN (Hemoglobinopathy evaluation)   No results found for: LDH  No results found for: IRON, TIBC, IRONPCTSAT (Iron and TIBC)  No results found for: FERRITIN  Urinalysis No results found for: COLORURINE, APPEARANCEUR, LABSPEC, PHURINE, GLUCOSEU, HGBUR, BILIRUBINUR, KETONESUR, PROTEINUR, UROBILINOGEN, NITRITE, LEUKOCYTESUR   STUDIES: MM DIAG BREAST TOMO BILATERAL  Result Date: 04/30/2019  CLINICAL DATA:  Status post right lumpectomy and radiation therapy for breast cancer in 2019. EXAM: DIGITAL DIAGNOSTIC BILATERAL MAMMOGRAM WITH CAD AND TOMO COMPARISON:  Previous exam(s). ACR Breast Density Category b: There are scattered areas of fibroglandular density. FINDINGS: Stable post lumpectomy changes on the right. Improved postradiation skin thickening on the right. No interval findings suspicious for malignancy in either breast. Mammographic images were processed with CAD. IMPRESSION: No evidence of malignancy. RECOMMENDATION: Bilateral diagnostic mammogram in 1 year. I have  discussed the findings and recommendations with the patient. If applicable, a reminder letter will be sent to the patient regarding the next appointment. BI-RADS CATEGORY  2: Benign. Electronically Signed   By: Claudie Revering M.D.   On: 04/30/2019 15:22    ELIGIBLE FOR AVAILABLE RESEARCH PROTOCOL: no  ASSESSMENT: 64 y.o. [place] woman status post right breast inner lower quadrant pT1a pNX, stage IA invasive lobular carcinoma, grade 1, estrogen and progesterone receptor positive, HER-2 not amplified, with an MIB-1 of 2%  (1) positive margin cleared with additional surgery 06/14/2017; sentinel lymph node (single) was negative (pT1a pN0)  (2) adjuvant radiation 07/17/17 - 08/11/17 Site/dose:   40.05 Gy directed to the right breast in 15 fractions, followed by 10 Gy in 5 fractions.  (3) tamoxifen started 09/13/2017  (a) venlafaxine 37.5 mg XR added 12/12/2017--discontinued due to side effects  (b) gabapentin 300 mg p.o. nightly added 05/09/2018   PLAN: Kriss is just about 2 years out from definitive surgery for breast cancer with no evidence of disease recurrence.  This is favorable.  She is tolerating tamoxifen well and the plan will be to continue that a total of 5 years.  She is enjoying her keto diet and it is working very well for her.  If in addition she starts to exercise on a regular basis she will do even better next year when she returns to see me.  They are keeping appropriate pandemic precautions  She knows to call for any other issue that may develop before the next visit  Total encounter time 25 minutes.*  Braelin Brosch, Susan Dad, MD  05/13/19 3:59 PM Medical Oncology and Hematology [facility] [place], [place] [address] Tel. [phone]    Fax. [phone]   I, Wilburn Mylar, am acting as scribe for Dr. Virgie Christensen. Jenilyn Magana.  I, Lurline Del MD, have reviewed the above documentation for accuracy and completeness, and I agree with  the above.    *Total Encounter Time as defined by the Centers for Medicare and Medicaid Services includes, in addition to the face-to-face time of a patient visit (documented in the note above) non-face-to-face time: obtaining and reviewing outside history, ordering and reviewing medications, tests or procedures, care coordination (communications with other health care professionals or caregivers) and documentation in the medical record.

## 2019-05-13 ENCOUNTER — Inpatient Hospital Stay (HOSPITAL_BASED_OUTPATIENT_CLINIC_OR_DEPARTMENT_OTHER): Payer: BC Managed Care – PPO | Admitting: Oncology

## 2019-05-13 ENCOUNTER — Other Ambulatory Visit: Payer: Self-pay

## 2019-05-13 ENCOUNTER — Inpatient Hospital Stay: Payer: BC Managed Care – PPO | Attending: Oncology

## 2019-05-13 VITALS — BP 144/65 | HR 87 | Temp 98.5°F | Resp 18 | Ht 61.0 in | Wt 138.8 lb

## 2019-05-13 DIAGNOSIS — Z79899 Other long term (current) drug therapy: Secondary | ICD-10-CM | POA: Insufficient documentation

## 2019-05-13 DIAGNOSIS — E782 Mixed hyperlipidemia: Secondary | ICD-10-CM | POA: Diagnosis not present

## 2019-05-13 DIAGNOSIS — Z8249 Family history of ischemic heart disease and other diseases of the circulatory system: Secondary | ICD-10-CM | POA: Insufficient documentation

## 2019-05-13 DIAGNOSIS — E7439 Other disorders of intestinal carbohydrate absorption: Secondary | ICD-10-CM

## 2019-05-13 DIAGNOSIS — C50311 Malignant neoplasm of lower-inner quadrant of right female breast: Secondary | ICD-10-CM

## 2019-05-13 DIAGNOSIS — I10 Essential (primary) hypertension: Secondary | ICD-10-CM | POA: Insufficient documentation

## 2019-05-13 DIAGNOSIS — Z170 Estrogen receptor positive status [ER+]: Secondary | ICD-10-CM

## 2019-05-13 DIAGNOSIS — Z87442 Personal history of urinary calculi: Secondary | ICD-10-CM | POA: Diagnosis not present

## 2019-05-13 DIAGNOSIS — N6489 Other specified disorders of breast: Secondary | ICD-10-CM | POA: Insufficient documentation

## 2019-05-13 DIAGNOSIS — Z823 Family history of stroke: Secondary | ICD-10-CM | POA: Insufficient documentation

## 2019-05-13 DIAGNOSIS — R234 Changes in skin texture: Secondary | ICD-10-CM | POA: Diagnosis not present

## 2019-05-13 DIAGNOSIS — L719 Rosacea, unspecified: Secondary | ICD-10-CM | POA: Diagnosis not present

## 2019-05-13 DIAGNOSIS — Z818 Family history of other mental and behavioral disorders: Secondary | ICD-10-CM | POA: Diagnosis not present

## 2019-05-13 DIAGNOSIS — E785 Hyperlipidemia, unspecified: Secondary | ICD-10-CM | POA: Diagnosis not present

## 2019-05-13 DIAGNOSIS — K760 Fatty (change of) liver, not elsewhere classified: Secondary | ICD-10-CM | POA: Insufficient documentation

## 2019-05-13 DIAGNOSIS — Z79810 Long term (current) use of selective estrogen receptor modulators (SERMs): Secondary | ICD-10-CM | POA: Insufficient documentation

## 2019-05-13 DIAGNOSIS — R7401 Elevation of levels of liver transaminase levels: Secondary | ICD-10-CM

## 2019-05-13 DIAGNOSIS — R7402 Elevation of levels of lactic acid dehydrogenase (LDH): Secondary | ICD-10-CM

## 2019-05-13 LAB — COMPREHENSIVE METABOLIC PANEL
ALT: 19 U/L (ref 0–44)
AST: 19 U/L (ref 15–41)
Albumin: 4 g/dL (ref 3.5–5.0)
Alkaline Phosphatase: 63 U/L (ref 38–126)
Anion gap: 10 (ref 5–15)
BUN: 16 mg/dL (ref 8–23)
CO2: 24 mmol/L (ref 22–32)
Calcium: 8.6 mg/dL — ABNORMAL LOW (ref 8.9–10.3)
Chloride: 107 mmol/L (ref 98–111)
Creatinine, Ser: 0.81 mg/dL (ref 0.44–1.00)
GFR calc Af Amer: >60 mL/min (ref >60)
GFR calc non Af Amer: >60 mL/min (ref >60)
Glucose, Bld: 84 mg/dL (ref 70–99)
Potassium: 3.9 mmol/L (ref 3.5–5.1)
Sodium: 141 mmol/L (ref 135–145)
Total Bilirubin: 0.4 mg/dL (ref 0.3–1.2)
Total Protein: 7 g/dL (ref 6.5–8.1)

## 2019-05-13 LAB — CBC WITH DIFFERENTIAL/PLATELET
Abs Immature Granulocytes: 0.03 10*3/uL (ref 0.00–0.07)
Basophils Absolute: 0.1 10*3/uL (ref 0.0–0.1)
Basophils Relative: 1 %
Eosinophils Absolute: 0.2 10*3/uL (ref 0.0–0.5)
Eosinophils Relative: 2 %
HCT: 45 % (ref 36.0–46.0)
Hemoglobin: 15 g/dL (ref 12.0–15.0)
Immature Granulocytes: 0 %
Lymphocytes Relative: 21 %
Lymphs Abs: 2.2 10*3/uL (ref 0.7–4.0)
MCH: 31.1 pg (ref 26.0–34.0)
MCHC: 33.3 g/dL (ref 30.0–36.0)
MCV: 93.4 fL (ref 80.0–100.0)
Monocytes Absolute: 0.8 10*3/uL (ref 0.1–1.0)
Monocytes Relative: 7 %
Neutro Abs: 7.1 10*3/uL (ref 1.7–7.7)
Neutrophils Relative %: 69 %
Platelets: 290 10*3/uL (ref 150–400)
RBC: 4.82 MIL/uL (ref 3.87–5.11)
RDW: 11.9 % (ref 11.5–15.5)
WBC: 10.5 10*3/uL (ref 4.0–10.5)
nRBC: 0 % (ref 0.0–0.2)

## 2019-05-13 MED ORDER — GABAPENTIN 300 MG PO CAPS: 300.0000 mg | ORAL_CAPSULE | Freq: Every day | ORAL | 4 refills | Status: DC

## 2019-05-13 MED ORDER — TAMOXIFEN CITRATE 20 MG PO TABS: 20.0000 mg | ORAL_TABLET | Freq: Every day | ORAL | 4 refills | Status: DC

## 2019-05-14 ENCOUNTER — Telehealth: Payer: Self-pay | Admitting: Oncology

## 2019-05-14 NOTE — Telephone Encounter (Signed)
I talk with patient regarding schedule  

## 2019-05-15 ENCOUNTER — Telehealth: Payer: Self-pay | Admitting: Family Medicine

## 2019-05-15 NOTE — Telephone Encounter (Signed)
We dont do prior authorizations on blood work she may need to call her insurance company and find out their preferred lab

## 2019-05-15 NOTE — Telephone Encounter (Signed)
Left pt voicemail informing pt

## 2019-05-15 NOTE — Telephone Encounter (Signed)
Patient is calling and states her insurance is not covering the blood work and states it is needing prior authorization

## 2019-05-20 ENCOUNTER — Ambulatory Visit: Payer: BC Managed Care – PPO | Admitting: Family Medicine

## 2019-05-23 ENCOUNTER — Other Ambulatory Visit: Payer: Self-pay | Admitting: Oncology

## 2019-06-27 ENCOUNTER — Encounter: Payer: Self-pay | Admitting: Family Medicine

## 2019-06-27 ENCOUNTER — Ambulatory Visit (INDEPENDENT_AMBULATORY_CARE_PROVIDER_SITE_OTHER): Payer: BC Managed Care – PPO | Admitting: Family Medicine

## 2019-06-27 ENCOUNTER — Other Ambulatory Visit: Payer: Self-pay

## 2019-06-27 VITALS — BP 140/74 | HR 92 | Temp 97.9°F | Ht 61.0 in | Wt 140.6 lb

## 2019-06-27 DIAGNOSIS — Z0000 Encounter for general adult medical examination without abnormal findings: Secondary | ICD-10-CM | POA: Diagnosis not present

## 2019-06-27 DIAGNOSIS — R7309 Other abnormal glucose: Secondary | ICD-10-CM | POA: Diagnosis not present

## 2019-06-27 DIAGNOSIS — K649 Unspecified hemorrhoids: Secondary | ICD-10-CM | POA: Insufficient documentation

## 2019-06-27 DIAGNOSIS — E785 Hyperlipidemia, unspecified: Secondary | ICD-10-CM | POA: Diagnosis not present

## 2019-06-27 DIAGNOSIS — J45909 Unspecified asthma, uncomplicated: Secondary | ICD-10-CM | POA: Insufficient documentation

## 2019-06-27 DIAGNOSIS — I10 Essential (primary) hypertension: Secondary | ICD-10-CM | POA: Diagnosis not present

## 2019-06-27 MED ORDER — AMLODIPINE BESYLATE 10 MG PO TABS: 10.0000 mg | ORAL_TABLET | Freq: Every day | ORAL | 1 refills | Status: AC

## 2019-06-27 MED ORDER — ARNUITY ELLIPTA 200 MCG/ACT IN AEPB: 1.0000 | INHALATION_SPRAY | RESPIRATORY_TRACT | 5 refills | Status: DC

## 2019-06-27 MED ORDER — BENAZEPRIL HCL 20 MG PO TABS: 20.0000 mg | ORAL_TABLET | Freq: Every day | ORAL | 1 refills | Status: DC

## 2019-06-27 NOTE — Progress Notes (Signed)
Established Patient Office Visit  Subjective:  Patient ID: Susan Christensen, female    DOB: 1956-03-16  Age: 65 y.o. MRN: [id number]  CC:  Chief Complaint  Patient presents with  . Annual Exam    annual exam and also need refill on medication amlodipine and benazepril    HPI Susan Christensen presents for annual exam-menopause 10 years ago Dry eye-Dr. Jorja Loa GI referral made-paperwork to be completed Past Medical History:  Diagnosis Date  . Allergy   . Asthma   . Cancer [facility])    Breast cancer  . Environmental and seasonal allergies   . Fatty liver   . GERD (gastroesophageal reflux disease)   . History of kidney stones   . History of radiation therapy 07/17/17- 08/11/17   40.05 Gy directed to the right breast in 15 fractions, followed by Boost 10 Gy in 5 fractions  . Hypertension   . Papilloma of breast    right    Past Surgical History:  Procedure Laterality Date  . BREAST BIOPSY Right 05/30/2017   Procedure: RIGHT NIPPLE BIOPSY;  Surgeon: Erroll Luna, MD;  Location: [place];  Service: General;  Laterality: Right;  . BREAST LUMPECTOMY WITH RADIOACTIVE SEED LOCALIZATION Right 05/30/2017   Procedure: RIGHT BREAST LUMPECTOMY WITH RADIOACTIVE SEED LOCALIZATION x2;  Surgeon: Erroll Luna, MD;  Location: [place];  Service: General;  Laterality: Right;  . BREAST LUMPECTOMY WITH SENTINEL LYMPH NODE BIOPSY Right 06/14/2017   Procedure: RE-EXCISION OF RIGHT BREAST LUMPECTOMY WITH RIGHT SENTINEL LYMPH NODE BX;  Surgeon: Erroll Luna, MD;  Location: [place];  Service: General;  Laterality: Right;  . CESAREAN SECTION    . COLONOSCOPY  2009   Dr. Oneida Alar: normal. Screening in 10 years  . CYST REMOVAL HAND Left     Family History  Problem Relation Age of Onset  . Heart attack Father   . Heart disease Father   . Hypertension Father   . Heart disease Mother        has pacemaker  . Stroke Mother   . Colon cancer Neg Hx   . Colon polyps Neg  Hx   . Breast cancer Neg Hx     Social History   Socioeconomic History  . Marital status: Married    Spouse name: Not on file  . Number of children: Not on file  . Years of education: Not on file  . Highest education level: Not on file  Occupational History  . Occupation: Network engineer    Comment: [employer]  . Smoking status: Never Smoker  . Smokeless tobacco: Never Used  Substance and Sexual Activity  . Alcohol use: Yes    Comment: rare   . Drug use: No  . Sexual activity: Not Currently  Other Topics Concern  . Not on file  Social History Narrative  . Not on file   Social Determinants of Health   Financial Resource Strain:   . Difficulty of Paying Living Expenses:   Food Insecurity:   . Worried About Charity fundraiser in the Last Year:   . Arboriculturist in the Last Year:   Transportation Needs:   . Film/video editor (Medical):   Marland Kitchen Lack of Transportation (Non-Medical):   Physical Activity:   . Days of Exercise per Week:   . Minutes of Exercise per Session:   Stress:   . Feeling of Stress :   Social Connections:   . Frequency  of Communication with Friends and Family:   . Frequency of Social Gatherings with Friends and Family:   . Attends Religious Services:   . Active Member of Clubs or Organizations:   . Attends Archivist Meetings:   Marland Kitchen Marital Status:   Intimate Partner Violence:   . Fear of Current or Ex-Partner:   . Emotionally Abused:   Marland Kitchen Physically Abused:   . Sexually Abused:     Outpatient Medications Prior to Visit  Medication Sig Dispense Refill  . albuterol (PROAIR HFA) 108 (90 Base) MCG/ACT inhaler Inhale 2 puffs into the lungs every 6 (six) hours as needed for wheezing or shortness of breath. 18 g 5  . amLODipine (NORVASC) 10 MG tablet Take 10 mg by mouth daily.    . ARNUITY ELLIPTA 200 MCG/ACT AEPB Inhale 1 puff into the lungs every morning.     . benazepril (LOTENSIN) 20 MG tablet TAKE 1 TABLET BY MOUTH  ONCE DAILY 30 tablet 0  . cholecalciferol (VITAMIN D) 1000 UNITS tablet Take 1,000 Units by mouth daily.    . fluticasone (FLONASE) 50 MCG/ACT nasal spray Place 1 spray into both nostrils daily.     Marland Kitchen gabapentin (NEURONTIN) 300 MG capsule Take 1 capsule (300 mg total) by mouth at bedtime. 90 capsule 4  . Propylene Glycol (SYSTANE BALANCE OP) Apply 1 drop to eye every morning. Pt may use up to 4 times daily prn    . tamoxifen (NOLVADEX) 20 MG tablet Take 1 tablet (20 mg total) by mouth daily. 90 tablet 4   No facility-administered medications prior to visit.    Allergies  Allergen Reactions  . Effexor [Venlafaxine] Swelling  . Shellfish Allergy Anaphylaxis    ROS Review of Systems  Constitutional: Negative.   HENT: Negative.   Eyes:       Glasses utd Dry eyes  Respiratory:       Started Arnuity Ellipta-took shots in the past  Cardiovascular: Negative.   Gastrointestinal:       Hemorrhoids-external  Endocrine: Negative.        Elevated glucose  Genitourinary: Negative.   Musculoskeletal: Negative.   Skin: Negative.   Allergic/Immunologic: Negative.   Neurological: Negative.   Hematological: Negative.   Psychiatric/Behavioral: Negative.       Objective:    Physical Exam  Constitutional: She is oriented to person, place, and time. She appears well-developed and well-nourished.  HENT:  Head: Normocephalic and atraumatic.  Right Ear: External ear normal.  Left Ear: External ear normal.  Nose: Nose normal.  Mouth/Throat: Oropharynx is clear and moist.  Eyes: Conjunctivae are normal.  Cardiovascular: Normal rate, regular rhythm, normal heart sounds and intact distal pulses.  Pulmonary/Chest: Effort normal and breath sounds normal.  Abdominal: Soft. Bowel sounds are normal.  Genitourinary:    Vagina and uterus normal.        Pelvic exam was performed with patient supine.     No vaginal discharge, erythema, tenderness or bleeding.  No erythema, tenderness or bleeding  in the vagina.    No foreign body in the vagina.     No signs of injury in the vagina.     Genitourinary Comments: Hemorrhoid external -non thrombosed   Musculoskeletal:        General: Normal range of motion.     Cervical back: Normal range of motion and neck supple.  Neurological: She is oriented to person, place, and time.  Skin: Skin is warm and dry.  Hypopigmented skin in [place] ,  shoulders, chest, face  Soft tissue lesion left shoulder 7cm  Psychiatric: She has a normal mood and affect. Her behavior is normal.    BP 140/74 (BP Location: Left Arm, Patient Position: Sitting, Cuff Size: Normal)   Pulse 92   Temp 97.9 F (36.6 C) (Temporal)   Ht 5\' 1"  (1.549 m)   Wt 140 lb 9.6 oz (63.8 kg)   SpO2 97%   BMI 26.57 kg/m  Wt Readings from Last 3 Encounters:  06/27/19 140 lb 9.6 oz (63.8 kg)  05/13/19 138 lb 12.8 oz (63 kg)  04/03/19 140 lb 3.2 oz (63.6 kg)     Health Maintenance Due  Topic Date Due  . TETANUS/TDAP  Never done  . PAP SMEAR-Modifier  Never done   No results found for: TSH Lab Results  Component Value Date   WBC 10.5 05/13/2019   HGB 15.0 05/13/2019   HCT 45.0 05/13/2019   MCV 93.4 05/13/2019   PLT 290 05/13/2019   Lab Results  Component Value Date   NA 141 05/13/2019   K 3.9 05/13/2019   CO2 24 05/13/2019   GLUCOSE 84 05/13/2019   BUN 16 05/13/2019   CREATININE 0.81 05/13/2019   BILITOT 0.4 05/13/2019   ALKPHOS 63 05/13/2019   AST 19 05/13/2019   ALT 19 05/13/2019   PROT 7.0 05/13/2019   ALBUMIN 4.0 05/13/2019   CALCIUM 8.6 (L) 05/13/2019   ANIONGAP 10 05/13/2019   Lab Results  Component Value Date   HGBA1C 5.9 (A) 04/03/2019      Assessment & Plan:  1. Hyperlipidemia, unspecified hyperlipidemia type No  Medication currently  2. Essential hypertension Benazepril/amlodipine-stable - TSH - Urinalysis 3. Well adult exam Encouraged colonoscopy-pt has paperwork Breast exam and mammogram followed by oncology due to h/o breast  CA - Lipid Panel w/o Chol/HDL Ratio - COMPLETE METABOLIC PANEL WITH GFR - Cytology - non gyn  4. Elevated glucose - Hemoglobin A1c  5. Uncomplicated asthma, unspecified asthma severity, unspecified whether persistent Pt stablized on Arnuity Ellipta-evaluation to continue medication - Ambulatory referral to Allergy  6. Hemorrhoids, unspecified hemorrhoid type Pt declines treatment options Follow-up: 6 months with new provider-fasting labwork   Maudine Kluesner Hannah Beat, MD

## 2019-06-27 NOTE — Patient Instructions (Signed)
Call insurance for Bone Density -post menopausal Fasting labwork

## 2019-07-01 ENCOUNTER — Other Ambulatory Visit (HOSPITAL_COMMUNITY)
Admission: RE | Admit: 2019-07-01 | Discharge: 2019-07-01 | Disposition: A | Discharge status: Home / Self Care | Payer: BC Managed Care – PPO | Source: Ambulatory Visit | Attending: Family Medicine | Admitting: Family Medicine

## 2019-07-01 ENCOUNTER — Other Ambulatory Visit: Payer: Self-pay | Admitting: Emergency Medicine

## 2019-07-01 DIAGNOSIS — Z0000 Encounter for general adult medical examination without abnormal findings: Secondary | ICD-10-CM

## 2019-07-02 ENCOUNTER — Other Ambulatory Visit: Payer: Self-pay | Admitting: Emergency Medicine

## 2019-07-03 ENCOUNTER — Other Ambulatory Visit: Payer: Self-pay | Admitting: Emergency Medicine

## 2019-07-03 NOTE — Progress Notes (Unsigned)
Ancillary testing thi

## 2019-07-04 DIAGNOSIS — Z23 Encounter for immunization: Secondary | ICD-10-CM | POA: Diagnosis not present

## 2019-07-04 LAB — CYTOLOGY - PAP
Comment: NEGATIVE
Diagnosis: NEGATIVE
High risk HPV: NEGATIVE

## 2019-07-10 ENCOUNTER — Telehealth: Payer: Self-pay | Admitting: Emergency Medicine

## 2019-07-10 NOTE — Telephone Encounter (Signed)
Left a msg vm on machine to give a call reference lab/ pap result. Pap and HSV was neg

## 2019-07-10 NOTE — Telephone Encounter (Signed)
-----   Message from Maryruth Hancock, MD sent at 07/04/2019 12:15 PM EDT ----- Pap neg, HPV negative

## 2019-07-27 DIAGNOSIS — Z23 Encounter for immunization: Secondary | ICD-10-CM | POA: Diagnosis not present

## 2019-08-14 ENCOUNTER — Encounter: Payer: Self-pay | Admitting: Allergy & Immunology

## 2019-08-14 ENCOUNTER — Other Ambulatory Visit: Payer: Self-pay

## 2019-08-14 ENCOUNTER — Ambulatory Visit (INDEPENDENT_AMBULATORY_CARE_PROVIDER_SITE_OTHER): Payer: BC Managed Care – PPO | Admitting: Allergy & Immunology

## 2019-08-14 VITALS — BP 156/88 | HR 108 | Temp 97.7°F | Resp 18 | Ht 61.1 in | Wt 142.8 lb

## 2019-08-14 DIAGNOSIS — J310 Chronic rhinitis: Secondary | ICD-10-CM | POA: Diagnosis not present

## 2019-08-14 DIAGNOSIS — J4540 Moderate persistent asthma, uncomplicated: Secondary | ICD-10-CM | POA: Diagnosis not present

## 2019-08-14 DIAGNOSIS — T7800XD Anaphylactic reaction due to unspecified food, subsequent encounter: Secondary | ICD-10-CM | POA: Diagnosis not present

## 2019-08-14 MED ORDER — BREO ELLIPTA 200-25 MCG/INH IN AEPB: 1.0000 | INHALATION_SPRAY | Freq: Every day | RESPIRATORY_TRACT | 5 refills | Status: DC

## 2019-08-14 NOTE — Patient Instructions (Signed)
1. Mild persistent asthma, uncomplicated - Lung testing looked slightly low, but it improved the albuterol treatment. - Stop the Arnuity and start Breo 200/.25 instead - Daily controller medication(s): Breo 200/59mcg one puff once daily - Prior to physical activity: albuterol 2 puffs 10-15 minutes before physical activity. - Rescue medications: albuterol 4 puffs every 4-6 hours as needed - Asthma control goals:  * Full participation in all desired activities (may need albuterol before activity) * Albuterol use two time or less a week on average (not counting use with activity) * Cough interfering with sleep two time or less a month * Oral steroids no more than once a year * No hospitalizations  2. Chronic rhinitis - Continue with fluticasone 1-2 sprays per nostril daily.  - We can do allergy testing again in the future if needed.   3. Anaphylactic shock due to food (crab, [employer]	[place]) - Let's send some blood work to see where the level is hanging out.   4. Return in about 3 months (around 11/14/2019). This can be an in-person, a virtual Webex or a telephone follow up visit.   Please inform us of any Emergency Department visits, hospitalizations, or changes in symptoms. Call us before going to the ED for breathing or allergy symptoms since we might be able to fit you in for a sick visit. Feel free to contact us anytime with any questions, problems, or concerns.  It was a pleasure to meet you today!  Websites that have reliable patient information: 1. American Academy of Asthma, Allergy, and Immunology: www.aaaai.org 2. Food Allergy Research and Education (FARE): foodallergy.org 3. Mothers of Asthmatics: http://www.asthmacommunitynetwork.org 4. American College of Allergy, Asthma, and Immunology: www.acaai.org   COVID-19 Vaccine Information can be found at: ShippingScam.co.uk For questions related to vaccine distribution or  appointments, please email [email or web address] or call [phone].     "Like" [place] on Facebook and Instagram for our latest updates!       HAPPY SPRING!  Make sure you are registered to vote! If you have moved or changed any of your contact information, you will need to get this updated before voting!  In some cases, you MAY be able to register to vote online: [email or web address]

## 2019-08-14 NOTE — Progress Notes (Signed)
NEW PATIENT  Date of Service/Encounter:  08/14/19  Referring provider: Celene Squibb, MD   Assessment:   Moderate persistent asthma, uncomplicated   Chronic rhinitis  Anaphylactic shock due to food  Plan/Recommendations:   1. Mild persistent asthma, uncomplicated - Lung testing looked slightly low, but it improved the albuterol treatment. - Stop the Arnuity and start Breo 200/.25 instead - Daily controller medication(s): Breo 200/66mcg one puff once daily - Prior to physical activity: albuterol 2 puffs 10-15 minutes before physical activity. - Rescue medications: albuterol 4 puffs every 4-6 hours as needed - Asthma control goals:  * Full participation in all desired activities (may need albuterol before activity) * Albuterol use two time or less a week on average (not counting use with activity) * Cough interfering with sleep two time or less a month * Oral steroids no more than once a year * No hospitalizations  2. Chronic rhinitis - Continue with fluticasone 1-2 sprays per nostril daily.  - We can do allergy testing again in the future if needed.   3. Anaphylactic shock due to food (crab, [employer]	[place]) - Let's send some blood work to see where the level is hanging out.   4. Return in about 3 months (around 11/14/2019). This can be an in-person, a virtual Webex or a telephone follow up visit.  Subjective:   Susan Christensen is a 64 y.o. female presenting today for evaluation of  Chief Complaint  Patient presents with  . Asthma    Well controlled. No problems.     Susan Christensen has a history of the following: Patient Active Problem List   Diagnosis Date Noted  . Elevated glucose 06/27/2019  . Uncomplicated asthma [id number]  . Hemorrhoids 06/27/2019  . Well adult exam 04/03/2019  . Glucose intolerance 05/09/2018  . Malignant neoplasm of lower-inner quadrant of right breast of female, estrogen receptor positive ([place]) 06/20/2017  . Fatty liver 08/10/2016  .  Hyperlipidemia 01/20/2009  . OTHER CHRONIC ALLERGIC CONJUNCTIVITIS 01/20/2009  . Essential hypertension 01/20/2009  . GESTATIONAL DIABETES 01/20/2009  . ROSACEA 01/20/2009  . LIVER FUNCTION TESTS, ABNORMAL, HX OF 01/20/2009  . ABNORMAL TRANSAMINASE, (LFT'S) 07/04/2008  . [place] ALLERGY 07/04/2008    History obtained from: chart review and patient.  Susan Christensen was referred by Celene Squibb, MD.     Susan Christensen is a 64 y.o. female presenting for an evaluation of allergies and asthma.  They are in the process of moving to Dr. Delphina Cahill as a PCP. She was previously followed by Dr. Luan Pulling before he retired.    Asthma/Respiratory Symptom History: She has had a diagnosis of asthma since she was a teenager. Now she is on Arnuity one puff once daily. This has been a Higher education careers adviser for. She does not feel that she has an issue when she uses it. She is on the 231mcg dose. She was on ProAir alone at that time. She uses the ProAir once night to help her to sleep. She is not using the ProAir now and she was changed to generic albuterol. She is now followed by Dr. Holly Bodily. She was never a smoker and her husband was a smoker before they got married. She did grow up with smoking from her father.    Allergic Rhinitis Symptom History: Flonase keeps her under control. She has been tested in the past. It started off around 25 years ago. She was on allergy shots for a year or two. She is from [place].  Food Allergy Symptom History: She had crab dip that led to breathing difficulties. She saw Dr. Smitty Pluck in [place] and she was diagnosed with food allergies and environmental allergies. She also avoids [facility] since this makes her hands break out. She eats flounder without a problem. She does not have an EpiPen at all.  Otherwise, there is no history of other atopic diseases, including food allergies, drug allergies, stinging insect allergies, eczema, urticaria or contact dermatitis. There is no  significant infectious history. Vaccinations are up to date.    Past Medical History: Patient Active Problem List   Diagnosis Date Noted  . Elevated glucose 06/27/2019  . Uncomplicated asthma 123XX123  . Hemorrhoids 06/27/2019  . Well adult exam 04/03/2019  . Glucose intolerance 05/09/2018  . Malignant neoplasm of lower-inner quadrant of right breast of female, estrogen receptor positive ([place]) 06/20/2017  . Fatty liver 08/10/2016  . Hyperlipidemia 01/20/2009  . OTHER CHRONIC ALLERGIC CONJUNCTIVITIS 01/20/2009  . Essential hypertension 01/20/2009  . GESTATIONAL DIABETES 01/20/2009  . ROSACEA 01/20/2009  . LIVER FUNCTION TESTS, ABNORMAL, HX OF 01/20/2009  . ABNORMAL TRANSAMINASE, (LFT'S) 07/04/2008  . [place] ALLERGY 07/04/2008    Medication List:  Allergies as of 08/14/2019      Reactions   Effexor [venlafaxine] Swelling   Shellfish Allergy Anaphylaxis      Medication List       Accurate as of Aug 14, 2019  4:21 PM. If you have any questions, ask your nurse or doctor.        STOP taking these medications   Arnuity Ellipta 200 MCG/ACT Aepb Generic drug: Fluticasone Furoate Stopped by: Valentina Shaggy, MD     TAKE these medications   albuterol 108 (90 Base) MCG/ACT inhaler Commonly known as: ProAir HFA Inhale 2 puffs into the lungs every 6 (six) hours as needed for wheezing or shortness of breath.   amLODipine 10 MG tablet Commonly known as: NORVASC Take 1 tablet (10 mg total) by mouth daily.   benazepril 20 MG tablet Commonly known as: LOTENSIN Take 1 tablet (20 mg total) by mouth daily.   Breo Ellipta 200-25 MCG/INH Aepb Generic drug: fluticasone furoate-vilanterol Inhale 1 puff into the lungs daily. Started by: Valentina Shaggy, MD   cholecalciferol 1000 units tablet Commonly known as: VITAMIN D Take 1,000 Units by mouth daily.   fluticasone 50 MCG/ACT nasal spray Commonly known as: FLONASE Place 1 spray into both nostrils daily.     gabapentin 300 MG capsule Commonly known as: NEURONTIN Take 1 capsule (300 mg total) by mouth at bedtime.   SYSTANE BALANCE OP Apply 1 drop to eye every morning. Pt may use up to 4 times daily prn   tamoxifen 20 MG tablet Commonly known as: NOLVADEX Take 1 tablet (20 mg total) by mouth daily.       Birth History: non-contributory  Developmental History: non-contributory  Past Surgical History: Past Surgical History:  Procedure Laterality Date  . BREAST BIOPSY Right 05/30/2017   Procedure: RIGHT NIPPLE BIOPSY;  Surgeon: Erroll Luna, MD;  Location: [place];  Service: General;  Laterality: Right;  . BREAST LUMPECTOMY WITH RADIOACTIVE SEED LOCALIZATION Right 05/30/2017   Procedure: RIGHT BREAST LUMPECTOMY WITH RADIOACTIVE SEED LOCALIZATION x2;  Surgeon: Erroll Luna, MD;  Location: [place];  Service: General;  Laterality: Right;  . BREAST LUMPECTOMY WITH SENTINEL LYMPH NODE BIOPSY Right 06/14/2017   Procedure: RE-EXCISION OF RIGHT BREAST LUMPECTOMY WITH RIGHT SENTINEL LYMPH NODE BX;  Surgeon: Erroll Luna,  MD;  Location: [place];  Service: General;  Laterality: Right;  . CESAREAN SECTION    . COLONOSCOPY  2009   Dr. Oneida Alar: normal. Screening in 10 years  . CYST REMOVAL HAND Left      Family History: Family History  Problem Relation Age of Onset  . Heart attack Father   . Heart disease Father   . Hypertension Father   . Heart disease Mother        has pacemaker  . Stroke Mother   . Colon cancer Neg Hx   . Colon polyps Neg Hx   . Breast cancer Neg Hx   . Allergic rhinitis Neg Hx   . Angioedema Neg Hx   . Asthma Neg Hx   . Atopy Neg Hx   . Eczema Neg Hx   . Immunodeficiency Neg Hx   . Urticaria Neg Hx      Social History: Christensen lives at home with her family.  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  Her daughter is 57 and getting a degree in Risk analyst.  She lives in a house that is 64 years old.   There are hardwoods in the main living areas.  She has gas heating and central cooling.  There are 2 cats inside of the home.  There are no dust mite covers on the bedding.  There is no tobacco exposure.  She is not exposed to chemicals or fumes.  She does not use a HEPA filter.  She does not live near an interstate or industrial area.   Review of Systems  Constitutional: Negative.  Negative for chills, fever, malaise/fatigue and weight loss.  HENT: Negative.  Negative for congestion, ear discharge, ear pain, sinus pain and sore throat.   Eyes: Negative for pain, discharge and redness.  Respiratory: Negative for cough, sputum production, shortness of breath and wheezing.   Cardiovascular: Negative.  Negative for chest pain and palpitations.  Gastrointestinal: Negative for abdominal pain, constipation, diarrhea, heartburn, nausea and vomiting.  Skin: Negative.  Negative for itching and rash.  Neurological: Negative for dizziness and headaches.  Endo/Heme/Allergies: Negative for environmental allergies. Does not bruise/bleed easily.       Objective:   Blood pressure (!) 156/88, pulse (!) 108, temperature 97.7 F (36.5 C), temperature source Temporal, resp. rate 18, height 5' 1.1" (1.552 m), weight 142 lb 12.8 oz (64.8 kg), SpO2 96 %. Body mass index is 26.89 kg/m.   Physical Exam:   Physical Exam  Constitutional: She appears well-developed.  Pleasant female.  Very talkative.  HENT:  Head: Normocephalic and atraumatic.  Right Ear: Tympanic membrane, external ear and ear canal normal. No drainage, swelling or tenderness. Tympanic membrane is not injected, not scarred, not erythematous, not retracted and not bulging.  Left Ear: Tympanic membrane, external ear and ear canal normal. No drainage, swelling or tenderness. Tympanic membrane is not injected, not scarred, not erythematous, not retracted and not bulging.  Nose: Mucosal edema and rhinorrhea present. No nasal deformity or septal  deviation. No epistaxis. Right sinus exhibits no maxillary sinus tenderness and no frontal sinus tenderness. Left sinus exhibits no maxillary sinus tenderness and no frontal sinus tenderness.  Mouth/Throat: Uvula is midline and oropharynx is clear and moist. Mucous membranes are not pale and not dry.  Tonsils normal size without exudates.  Eyes: Pupils are equal, round, and reactive to light. Conjunctivae and EOM are normal. Right eye exhibits no chemosis and no discharge. Left eye exhibits no chemosis  and no discharge. Right conjunctiva is not injected. Left conjunctiva is not injected.  Cardiovascular: Normal rate, regular rhythm and normal heart sounds.  Respiratory: Effort normal and breath sounds normal. No accessory muscle usage. No tachypnea. No respiratory distress. She has no wheezes. She has no rhonchi. She has no rales. She exhibits no tenderness.  Moving air well in all lung fields.  No increased work of breathing.  GI: There is no abdominal tenderness. There is no rebound and no guarding.  Lymphadenopathy:       Head (right side): No submandibular, no tonsillar and no occipital adenopathy present.       Head (left side): No submandibular, no tonsillar and no occipital adenopathy present.    She has no cervical adenopathy.  Neurological: She is alert.  Skin: No abrasion, no petechiae and no rash noted. Rash is not papular, not vesicular and not urticarial. No erythema. No pallor.  No eczematous or urticarial lesions noted.  Psychiatric: She has a normal mood and affect.     Diagnostic studies:    Spirometry: results abnormal (FEV1: 1.14/52%, FVC: 1.84/64%, FEV1/FVC: 62%).    Spirometry consistent with mixed obstructive and restrictive disease. Albuterol four puffs via MDI treatment given in clinic with significant improvement in FEV1 per ATS criteria.  Allergy Studies: none           Salvatore Marvel, MD [facility] [place]

## 2019-08-22 ENCOUNTER — Encounter: Payer: BC Managed Care – PPO | Admitting: Family Medicine

## 2019-09-18 ENCOUNTER — Encounter: Payer: BC Managed Care – PPO | Admitting: Family Medicine

## 2019-09-18 DIAGNOSIS — J302 Other seasonal allergic rhinitis: Secondary | ICD-10-CM | POA: Diagnosis not present

## 2019-09-18 DIAGNOSIS — Z803 Family history of malignant neoplasm of breast: Secondary | ICD-10-CM | POA: Diagnosis not present

## 2019-09-18 DIAGNOSIS — Z853 Personal history of malignant neoplasm of breast: Secondary | ICD-10-CM | POA: Diagnosis not present

## 2019-09-18 DIAGNOSIS — Z0189 Encounter for other specified special examinations: Secondary | ICD-10-CM | POA: Diagnosis not present

## 2019-10-01 DIAGNOSIS — Z0000 Encounter for general adult medical examination without abnormal findings: Secondary | ICD-10-CM | POA: Diagnosis not present

## 2019-10-01 DIAGNOSIS — I10 Essential (primary) hypertension: Secondary | ICD-10-CM | POA: Diagnosis not present

## 2019-10-01 DIAGNOSIS — E559 Vitamin D deficiency, unspecified: Secondary | ICD-10-CM | POA: Diagnosis not present

## 2019-10-17 DIAGNOSIS — R7301 Impaired fasting glucose: Secondary | ICD-10-CM | POA: Diagnosis not present

## 2019-10-17 DIAGNOSIS — Z712 Person consulting for explanation of examination or test findings: Secondary | ICD-10-CM | POA: Diagnosis not present

## 2019-10-19 ENCOUNTER — Other Ambulatory Visit: Payer: Self-pay | Admitting: Family Medicine

## 2019-11-11 ENCOUNTER — Ambulatory Visit: Payer: BC Managed Care – PPO | Admitting: Family Medicine

## 2019-11-13 ENCOUNTER — Ambulatory Visit (INDEPENDENT_AMBULATORY_CARE_PROVIDER_SITE_OTHER): Payer: BC Managed Care – PPO | Admitting: Allergy & Immunology

## 2019-11-13 ENCOUNTER — Other Ambulatory Visit: Payer: Self-pay

## 2019-11-13 ENCOUNTER — Encounter: Payer: Self-pay | Admitting: Allergy & Immunology

## 2019-11-13 VITALS — BP 132/84 | HR 93 | Resp 16

## 2019-11-13 DIAGNOSIS — T7800XD Anaphylactic reaction due to unspecified food, subsequent encounter: Secondary | ICD-10-CM

## 2019-11-13 DIAGNOSIS — J4540 Moderate persistent asthma, uncomplicated: Secondary | ICD-10-CM | POA: Diagnosis not present

## 2019-11-13 DIAGNOSIS — J310 Chronic rhinitis: Secondary | ICD-10-CM | POA: Diagnosis not present

## 2019-11-13 NOTE — Patient Instructions (Addendum)
1. Mild persistent asthma, uncomplicated - Lung testing looked better today, but still not normal. - Hwoever since you are doing better, I am not going to do anything differently.  - Daily controller medication(s): Breo 200/37mcg one puff once daily - Prior to physical activity: albuterol 2 puffs 10-15 minutes before physical activity. - Rescue medications: albuterol 4 puffs every 4-6 hours as needed - Asthma control goals:  * Full participation in all desired activities (may need albuterol before activity) * Albuterol use two time or less a week on average (not counting use with activity) * Cough interfering with sleep two time or less a month * Oral steroids no more than once a year * No hospitalizations  2. Chronic rhinitis - Continue with fluticasone 1-2 sprays per nostril daily.  - We can do allergy testing again in the future if needed.   3. Anaphylactic shock due to food (crab, [employer]	[place]) - Let's send some blood work to see where the level is hanging out.   4. Return in about 3 months (around 02/13/2020). This can be an in-person, a virtual Webex or a telephone follow up visit.   Please inform us of any Emergency Department visits, hospitalizations, or changes in symptoms. Call us before going to the ED for breathing or allergy symptoms since we might be able to fit you in for a sick visit. Feel free to contact us anytime with any questions, problems, or concerns.  It was a pleasure to see you again today!  Websites that have reliable patient information: 1. American Academy of Asthma, Allergy, and Immunology: www.aaaai.org 2. Food Allergy Research and Education (FARE): foodallergy.org 3. Mothers of Asthmatics: http://www.asthmacommunitynetwork.org 4. American College of Allergy, Asthma, and Immunology: www.acaai.org   COVID-19 Vaccine Information can be found at: ShippingScam.co.uk For questions related to vaccine  distribution or appointments, please email [email or web address] or call [phone].     "Like" [place] on Facebook and Instagram for our latest updates!        Make sure you are registered to vote! If you have moved or changed any of your contact information, you will need to get this updated before voting!  In some cases, you MAY be able to register to vote online: [email or web address]

## 2019-11-13 NOTE — Progress Notes (Signed)
FOLLOW UP  Date of Service/Encounter:  11/13/19   Assessment:   Moderate persistent asthma, uncomplicated   Persistent obstructive lung defect - full PFTs in the future?  Chronic rhinitis  Anaphylactic shock due to food (crab, [employer])   [name] continues to not make a lot of sense, but clinically she is doing very well. I am unclear how the albuterol is opening up her nose, but one puff of albuterol once a day is not too much of a problem. We are going to continue with the current medication regimen for now since she is doing so well. We will also strongly consider full PFTs in the future once COVID19 is behind [place] completely. We could certainly add a biologic or a LAMA, but at this point her symptoms do not justify that cost. We are going to get a CBC with differential as well as an IgE level in case we need to advance to a biologic in the future. She will also get her shellfish panel done at that time as well.   Plan/Recommendations:   1. Mild persistent asthma, uncomplicated - Lung testing looked better today, but still not normal. - Hwoever since you are doing better, I am not going to do anything differently.  - Daily controller medication(s): Breo 200/28mcg one puff once daily - Prior to physical activity: albuterol 2 puffs 10-15 minutes before physical activity. - Rescue medications: albuterol 4 puffs every 4-6 hours as needed - Asthma control goals:  * Full participation in all desired activities (may need albuterol before activity) * Albuterol use two time or less a week on average (not counting use with activity) * Cough interfering with sleep two time or less a month * Oral steroids no more than once a year * No hospitalizations  2. Chronic rhinitis - Continue with fluticasone 1-2 sprays per nostril daily.  - We can do allergy testing again in the future if needed.   3. Anaphylactic shock due to food (crab, [employer]	[place]) - Let's send some blood work to see  where the level is hanging out.   4. Return in about 3 months (around 02/13/2020). This can be an in-person, a virtual Webex or a telephone follow up visit.   Subjective:   SECRET Susan Christensen is a 64 y.o. female presenting today for follow up of  Chief Complaint  Patient presents with  . Asthma    Susan Christensen has a history of the following: Patient Active Problem List   Diagnosis Date Noted  . Elevated glucose 06/27/2019  . Uncomplicated asthma 23/55/7322  . Hemorrhoids 06/27/2019  . Well adult exam 04/03/2019  . Glucose intolerance 05/09/2018  . Malignant neoplasm of lower-inner quadrant of right breast of female, estrogen receptor positive ([place]) 06/20/2017  . Fatty liver 08/10/2016  . Hyperlipidemia 01/20/2009  . OTHER CHRONIC ALLERGIC CONJUNCTIVITIS 01/20/2009  . Essential hypertension 01/20/2009  . GESTATIONAL DIABETES 01/20/2009  . ROSACEA 01/20/2009  . LIVER FUNCTION TESTS, ABNORMAL, HX OF 01/20/2009  . ABNORMAL TRANSAMINASE, (LFT'S) 07/04/2008  . [place] ALLERGY 07/04/2008    History obtained from: chart review and patient.  Susan Christensen is a 64 y.o. female presenting for a follow up visit.  She was last seen in May 2021.  At that time, her lung testing looks slightly low but it did improve with the albuterol treatment.  We stopped her Arnuity and started Breo 200/25 mcg instead.  We continue with Flonase 1 to 2 sprays per nostril daily for her rhinitis.  We did not do allergy testing.  We did send some blood work to see where her allergy IgE levels were hanging out; it does not seem that these were collected.  Since last visit, she reports that she has done well. At the last visit, she felt that the [place] was working well. But the Memory Dance is working even better than the Arnuity was.   Asthma/Respiratory Symptom History: Memory Dance is working fairly well. She has not been needing her rescue inhaler much at all. She will use it at night to help "open [her] head up". She feels  that the albuterol helps to open up her nose. She was previously followed by Dr. Luan Pulling and had always been diagnosed as asthma.   Allergic Rhinitis Symptom History: She does use fluticasone every morning. She does not use it at night. She does not use an antihistamine. The fluticasone is enough to control every thing.   Her father was a heavy smoker during her childhood. She has never been a smoker herself. 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   Otherwise, there have been no changes to her past medical history, surgical history, family history, or social history.    Review of Systems  Constitutional: Negative.  Negative for chills, fever, malaise/fatigue and weight loss.  HENT: Negative.  Negative for congestion, ear discharge and ear pain.   Eyes: Negative for pain, discharge and redness.  Respiratory: Negative for cough, sputum production, shortness of breath and wheezing.   Cardiovascular: Negative.  Negative for chest pain and palpitations.  Gastrointestinal: Negative for abdominal pain, constipation, diarrhea, heartburn, nausea and vomiting.  Skin: Negative.  Negative for itching and rash.  Neurological: Negative for dizziness and headaches.  Endo/Heme/Allergies: Negative for environmental allergies. Does not bruise/bleed easily.       Objective:   Blood pressure 132/84, pulse 93, resp. rate 16, SpO2 98 %. There is no height or weight on file to calculate BMI.   Physical Exam:  Physical Exam Constitutional:      Appearance: She is well-developed.     Comments: Very pleasant and talkative female.   HENT:     Head: Normocephalic and atraumatic.     Right Ear: Tympanic membrane, ear canal and external ear normal.     Left Ear: Tympanic membrane, ear canal and external ear normal.     Nose: No nasal deformity, septal deviation, mucosal edema or rhinorrhea.     Right Turbinates: Enlarged and swollen.     Left  Turbinates: Enlarged and swollen.     Right Sinus: No maxillary sinus tenderness or frontal sinus tenderness.     Left Sinus: No maxillary sinus tenderness or frontal sinus tenderness.     Mouth/Throat:     Mouth: Mucous membranes are not pale and not dry.     Pharynx: Uvula midline.     Comments: Cobblestoning present in the posterior oropharynx. Eyes:     General:        Right eye: No discharge.        Left eye: No discharge.     Conjunctiva/sclera: Conjunctivae normal.     Right eye: Right conjunctiva is not injected. No chemosis.    Left eye: Left conjunctiva is not injected. No chemosis.    Pupils: Pupils are equal, round, and reactive to light.  Cardiovascular:     Rate and Rhythm: Normal rate and regular rhythm.     Heart sounds: Normal heart  sounds.  Pulmonary:     Effort: Pulmonary effort is normal. No tachypnea, accessory muscle usage or respiratory distress.     Breath sounds: Normal breath sounds. No wheezing, rhonchi or rales.     Comments: Moving air well in all lung fields. No increased work of breathing noted.  Chest:     Chest wall: No tenderness.  Lymphadenopathy:     Cervical: No cervical adenopathy.  Skin:    Coloration: Skin is not pale.     Findings: No abrasion, erythema, petechiae or rash. Rash is not papular, urticarial or vesicular.     Comments: No eczematous or urticarial lesions noted.   Neurological:     Mental Status: She is alert.      Diagnostic studies:    Spirometry: results abnormal (FEV1: 1.27/58%, FVC: 2.87/100%, FEV1/FVC: 44%).    Spirometry consistent with moderate obstructive disease. We did not attempt reversibility since she was clinically doing well.   Allergy Studies: none      Salvatore Marvel, MD  [facility] [place]

## 2019-11-23 LAB — IGE+ALLERGENS ZONE 2(30)
Alternaria Alternata IgE: <0.1 kU/L
Amer Sycamore IgE Qn: 0.11 kU/L — AB
Aspergillus Fumigatus IgE: <0.1 kU/L
Bahia Grass IgE: <0.1 kU/L
Bermuda Grass IgE: 0.41 kU/L — AB
Cat Dander IgE: 1.53 kU/L — AB
Cedar, Mountain IgE: 0.17 kU/L — AB
Cladosporium Herbarum IgE: <0.1 kU/L
Cockroach, American IgE: 0.57 kU/L — AB
Common Silver Birch IgE: 0.21 kU/L — AB
D Farinae IgE: 0.82 kU/L — AB
D Pteronyssinus IgE: 1.26 kU/L — AB
Dog Dander IgE: 0.81 kU/L — AB
Elm, American IgE: 0.11 kU/L — AB
Hickory, White IgE: 0.69 kU/L — AB
IgE (Immunoglobulin E), Serum: 220 IU/mL (ref 6–495)
Johnson Grass IgE: <0.1 kU/L
Maple/Box Elder IgE: <0.1 kU/L
Mucor Racemosus IgE: <0.1 kU/L
Mugwort IgE Qn: 0.18 kU/L — AB
Nettle IgE: 0.1 kU/L — AB
Oak, White IgE: 0.12 kU/L — AB
Penicillium Chrysogen IgE: <0.1 kU/L
Pigweed, Rough IgE: <0.1 kU/L
Plantain, English IgE: 0.14 kU/L — AB
Ragweed, Short IgE: 0.53 kU/L — AB
Sheep Sorrel IgE Qn: 0.2 kU/L — AB
Stemphylium Herbarum IgE: <0.1 kU/L
Sweet gum IgE RAST Ql: <0.1 kU/L
Timothy Grass IgE: <0.1 kU/L
White Mulberry IgE: <0.1 kU/L

## 2019-11-23 LAB — CBC WITH DIFFERENTIAL/PLATELET
Basophils Absolute: 0.1 10*3/uL (ref 0.0–0.2)
Basos: 1 %
EOS (ABSOLUTE): 0.3 10*3/uL (ref 0.0–0.4)
Eos: 3 %
Hematocrit: 45.3 % (ref 34.0–46.6)
Hemoglobin: 15 g/dL (ref 11.1–15.9)
Immature Grans (Abs): 0.1 10*3/uL (ref 0.0–0.1)
Immature Granulocytes: 1 %
Lymphocytes Absolute: 2.4 10*3/uL (ref 0.7–3.1)
Lymphs: 20 %
MCH: 30.5 pg (ref 26.6–33.0)
MCHC: 33.1 g/dL (ref 31.5–35.7)
MCV: 92 fL (ref 79–97)
Monocytes Absolute: 0.8 10*3/uL (ref 0.1–0.9)
Monocytes: 7 %
Neutrophils Absolute: 8.4 10*3/uL — ABNORMAL HIGH (ref 1.4–7.0)
Neutrophils: 68 %
Platelets: 303 10*3/uL (ref 150–450)
RBC: 4.91 x10E6/uL (ref 3.77–5.28)
RDW: 12.6 % (ref 11.7–15.4)
WBC: 12.1 10*3/uL — ABNORMAL HIGH (ref 3.4–10.8)

## 2020-02-08 ENCOUNTER — Other Ambulatory Visit: Payer: Self-pay | Admitting: Allergy & Immunology

## 2020-02-21 ENCOUNTER — Other Ambulatory Visit: Payer: Self-pay

## 2020-02-21 ENCOUNTER — Ambulatory Visit (INDEPENDENT_AMBULATORY_CARE_PROVIDER_SITE_OTHER): Payer: BC Managed Care – PPO | Admitting: Allergy & Immunology

## 2020-02-21 ENCOUNTER — Encounter: Payer: Self-pay | Admitting: Allergy & Immunology

## 2020-02-21 VITALS — BP 158/68 | HR 92 | Temp 97.7°F | Resp 18

## 2020-02-21 DIAGNOSIS — J4540 Moderate persistent asthma, uncomplicated: Secondary | ICD-10-CM | POA: Diagnosis not present

## 2020-02-21 DIAGNOSIS — T7800XD Anaphylactic reaction due to unspecified food, subsequent encounter: Secondary | ICD-10-CM | POA: Diagnosis not present

## 2020-02-21 DIAGNOSIS — J310 Chronic rhinitis: Secondary | ICD-10-CM | POA: Diagnosis not present

## 2020-02-21 MED ORDER — EPINEPHRINE 0.3 MG/0.3ML IJ SOAJ: 0.3000 mg | Freq: Once | INTRAMUSCULAR | 1 refills | Status: AC

## 2020-02-21 NOTE — Patient Instructions (Addendum)
1. Mild persistent asthma, uncomplicated - Lung testing still looks abnormal, but it has trended higher over time. - I know you are more than a number, so I am not going to worry about it too much.  - Daily controller medication(s): Breo 200/4mcg one puff once daily - Prior to physical activity: albuterol 2 puffs 10-15 minutes before physical activity. - Rescue medications: albuterol 4 puffs every 4-6 hours as needed - Asthma control goals:  * Full participation in all desired activities (may need albuterol before activity) * Albuterol use two time or less a week on average (not counting use with activity) * Cough interfering with sleep two time or less a month * Oral steroids no more than once a year * No hospitalizations  2. Chronic rhinitis - Continue with fluticasone 1-2 sprays per nostril daily.  - We can do allergy testing again in the future if needed.   3. Anaphylactic shock due to food (crab, [employer]	[place]) - Continue to avoid these foods. - EpiPen sent in out of an abundance of caution.  4. Return in about 6 months (around 08/20/2020).    Please inform us of any Emergency Department visits, hospitalizations, or changes in symptoms. Call us before going to the ED for breathing or allergy symptoms since we might be able to fit you in for a sick visit. Feel free to contact us anytime with any questions, problems, or concerns.  It was a pleasure to see you again today!  Websites that have reliable patient information: 1. American Academy of Asthma, Allergy, and Immunology: www.aaaai.org 2. Food Allergy Research and Education (FARE): foodallergy.org 3. Mothers of Asthmatics: http://www.asthmacommunitynetwork.org 4. American College of Allergy, Asthma, and Immunology: www.acaai.org   COVID-19 Vaccine Information can be found at: ShippingScam.co.uk For questions related to vaccine distribution or appointments, please email  [email or web address] or call [phone].     "Like" [place] on Facebook and Instagram for our latest updates!     HAPPY FALL!     Make sure you are registered to vote! If you have moved or changed any of your contact information, you will need to get this updated before voting!  In some cases, you MAY be able to register to vote online: [email or web address]

## 2020-02-21 NOTE — Progress Notes (Signed)
FOLLOW UP  Date of Service/Encounter:  02/21/20   Assessment:   Moderatepersistent asthma, uncomplicated   Persistent obstructive lung defect - full PFTs in the future?  Chronic rhinitis  Anaphylactic shock due to food (crab, [employer]	[place])  Fully vaccinated to [employer]) - needs booster   Plan/Recommendations:   1. Mild persistent asthma, uncomplicated - Lung testing still looks abnormal, but it has trended higher over time. - I know you are more than a number, so I am not going to worry about it too much.  - Daily controller medication(s): Breo 200/6mcg one puff once daily - Prior to physical activity: albuterol 2 puffs 10-15 minutes before physical activity. - Rescue medications: albuterol 4 puffs every 4-6 hours as needed - Asthma control goals:  * Full participation in all desired activities (may need albuterol before activity) * Albuterol use two time or less a week on average (not counting use with activity) * Cough interfering with sleep two time or less a month * Oral steroids no more than once a year * No hospitalizations  2. Chronic rhinitis - Continue with fluticasone 1-2 sprays per nostril daily.  - We can do allergy testing again in the future if needed.   3. Anaphylactic shock due to food (crab, [employer]	[place]) - Continue to avoid these foods. - EpiPen sent in out of an abundance of caution.  4. Return in about 6 months (around 08/20/2020).   Subjective:   Susan Christensen is a 64 y.o. female presenting today for follow up of  Chief Complaint  Patient presents with  . Follow-up    Susan Christensen has a history of the following: Patient Active Problem List   Diagnosis Date Noted  . Elevated glucose 06/27/2019  . Uncomplicated asthma 20/25/4270  . Hemorrhoids 06/27/2019  . Well adult exam 04/03/2019  . Glucose intolerance 05/09/2018  . Malignant neoplasm of lower-inner quadrant of right breast of female, estrogen receptor positive ([place])  06/20/2017  . Fatty liver 08/10/2016  . Hyperlipidemia 01/20/2009  . OTHER CHRONIC ALLERGIC CONJUNCTIVITIS 01/20/2009  . Essential hypertension 01/20/2009  . GESTATIONAL DIABETES 01/20/2009  . ROSACEA 01/20/2009  . LIVER FUNCTION TESTS, ABNORMAL, HX OF 01/20/2009  . ABNORMAL TRANSAMINASE, (LFT'S) 07/04/2008  . [place] ALLERGY 07/04/2008    History obtained from: chart review and patient.  Susan Christensen is a 64 y.o. female presenting for a follow up visit. She was last seen in August 2021. At that time, we continued with Breo one puff once daily.   Since last visit, she has done very well. She continues to work at [employer] from 9 AM until 1 PM. She enjoys her job quite a bit. Thankfully, on Sundays, she can just be regular church person.  Asthma/Respiratory Symptom History: She remains on her Breo one puff once daily. This seems to be working very well. This is $50 per month but it is controlling her symptoms. She is not on Medicare yet but she has one more year. She is sleeping well at night without coughing. She has not needed to go to the ED at all. She continues to feel better and better while taking the [facility].  Allergic Rhinitis Symptom History: She feels that the Flonase is taking care of things. This is mostly behind the scenes. She has not needed antibiotics at all.  She is going to get her COVID booster and needs to get the flu shot. She did not have issues with [employer].   Otherwise, there have been  no changes to her past medical history, surgical history, family history, or social history.    Review of Systems  Constitutional: Negative.  Negative for chills, fever, malaise/fatigue and weight loss.  HENT: Negative.  Negative for congestion, ear discharge, ear pain and sinus pain.   Eyes: Negative for pain, discharge and redness.  Respiratory: Negative for cough, sputum production, shortness of breath and wheezing.   Cardiovascular: Negative.  Negative for chest pain and  palpitations.  Gastrointestinal: Negative for abdominal pain, constipation, diarrhea, heartburn, nausea and vomiting.  Skin: Negative.  Negative for itching and rash.  Neurological: Negative for dizziness and headaches.  Endo/Heme/Allergies: Negative for environmental allergies. Does not bruise/bleed easily.       Objective:   Blood pressure (!) 158/68, pulse 92, temperature 97.7 F (36.5 C), temperature source Temporal, resp. rate 18, SpO2 98 %. There is no height or weight on file to calculate BMI.   Physical Exam:  Physical Exam Constitutional:      Appearance: She is well-developed.     Comments: Pleasant female. Talkative.  HENT:     Head: Normocephalic and atraumatic.     Right Ear: Tympanic membrane, ear canal and external ear normal.     Left Ear: Tympanic membrane, ear canal and external ear normal.     Nose: No nasal deformity, septal deviation, mucosal edema or rhinorrhea.     Right Turbinates: Enlarged and swollen.     Left Turbinates: Enlarged and swollen.     Right Sinus: No maxillary sinus tenderness or frontal sinus tenderness.     Left Sinus: No maxillary sinus tenderness or frontal sinus tenderness.     Mouth/Throat:     Mouth: Mucous membranes are not pale and not dry.     Pharynx: Uvula midline.  Eyes:     General:        Right eye: No discharge.        Left eye: No discharge.     Conjunctiva/sclera: Conjunctivae normal.     Right eye: Right conjunctiva is not injected. No chemosis.    Left eye: Left conjunctiva is not injected. No chemosis.    Pupils: Pupils are equal, round, and reactive to light.  Cardiovascular:     Rate and Rhythm: Normal rate and regular rhythm.     Heart sounds: Normal heart sounds.  Pulmonary:     Effort: Pulmonary effort is normal. No tachypnea, accessory muscle usage or respiratory distress.     Breath sounds: Normal breath sounds. No wheezing, rhonchi or rales.     Comments: Moving air well in all lung fields. No  increased work of breathing. No crackles. Chest:     Chest wall: No tenderness.  Lymphadenopathy:     Cervical: No cervical adenopathy.  Skin:    Coloration: Skin is not pale.     Findings: No abrasion, erythema, petechiae or rash. Rash is not papular, urticarial or vesicular.  Neurological:     Mental Status: She is alert.  Psychiatric:        Behavior: Behavior is cooperative.      Diagnostic studies:    Spirometry: results abnormal (FEV1: 1.35/62%, FVC: 2.17/76%, FEV1/FVC: 62%).    Spirometry consistent with mixed obstructive and restrictive disease. Overall, values have trended upwards with her FEV1 increasing from 52% to 62% today.   Allergy Studies: none      Salvatore Marvel, MD  [facility] [place]

## 2020-02-28 DIAGNOSIS — Z23 Encounter for immunization: Secondary | ICD-10-CM | POA: Diagnosis not present

## 2020-03-10 ENCOUNTER — Other Ambulatory Visit: Payer: Self-pay | Admitting: Allergy & Immunology

## 2020-05-26 ENCOUNTER — Other Ambulatory Visit: Payer: Self-pay

## 2020-05-26 ENCOUNTER — Inpatient Hospital Stay: Payer: BC Managed Care – PPO | Attending: Oncology | Admitting: Oncology

## 2020-05-26 ENCOUNTER — Inpatient Hospital Stay: Payer: BC Managed Care – PPO

## 2020-05-26 VITALS — BP 157/62 | HR 91 | Temp 98.1°F | Resp 18 | Ht 61.1 in | Wt 144.2 lb

## 2020-05-26 DIAGNOSIS — C50311 Malignant neoplasm of lower-inner quadrant of right female breast: Secondary | ICD-10-CM

## 2020-05-26 DIAGNOSIS — J45909 Unspecified asthma, uncomplicated: Secondary | ICD-10-CM | POA: Insufficient documentation

## 2020-05-26 DIAGNOSIS — Z87442 Personal history of urinary calculi: Secondary | ICD-10-CM | POA: Insufficient documentation

## 2020-05-26 DIAGNOSIS — Z823 Family history of stroke: Secondary | ICD-10-CM | POA: Insufficient documentation

## 2020-05-26 DIAGNOSIS — I10 Essential (primary) hypertension: Secondary | ICD-10-CM | POA: Diagnosis not present

## 2020-05-26 DIAGNOSIS — Z170 Estrogen receptor positive status [ER+]: Secondary | ICD-10-CM

## 2020-05-26 DIAGNOSIS — Z79899 Other long term (current) drug therapy: Secondary | ICD-10-CM | POA: Diagnosis not present

## 2020-05-26 DIAGNOSIS — E785 Hyperlipidemia, unspecified: Secondary | ICD-10-CM | POA: Insufficient documentation

## 2020-05-26 DIAGNOSIS — Z8249 Family history of ischemic heart disease and other diseases of the circulatory system: Secondary | ICD-10-CM | POA: Insufficient documentation

## 2020-05-26 DIAGNOSIS — Z79810 Long term (current) use of selective estrogen receptor modulators (SERMs): Secondary | ICD-10-CM | POA: Diagnosis not present

## 2020-05-26 LAB — CBC WITH DIFFERENTIAL/PLATELET
Abs Immature Granulocytes: 0.07 10*3/uL (ref 0.00–0.07)
Basophils Absolute: 0.1 10*3/uL (ref 0.0–0.1)
Basophils Relative: 1 %
Eosinophils Absolute: 0.3 10*3/uL (ref 0.0–0.5)
Eosinophils Relative: 3 %
HCT: 43.6 % (ref 36.0–46.0)
Hemoglobin: 14.6 g/dL (ref 12.0–15.0)
Immature Granulocytes: 1 %
Lymphocytes Relative: 21 %
Lymphs Abs: 2.2 10*3/uL (ref 0.7–4.0)
MCH: 31.1 pg (ref 26.0–34.0)
MCHC: 33.5 g/dL (ref 30.0–36.0)
MCV: 93 fL (ref 80.0–100.0)
Monocytes Absolute: 0.7 10*3/uL (ref 0.1–1.0)
Monocytes Relative: 6 %
Neutro Abs: 7.2 10*3/uL (ref 1.7–7.7)
Neutrophils Relative %: 68 %
Platelets: 271 10*3/uL (ref 150–400)
RBC: 4.69 MIL/uL (ref 3.87–5.11)
RDW: 12.1 % (ref 11.5–15.5)
WBC: 10.5 10*3/uL (ref 4.0–10.5)
nRBC: 0 % (ref 0.0–0.2)

## 2020-05-26 LAB — COMPREHENSIVE METABOLIC PANEL
ALT: 24 U/L (ref 0–44)
AST: 22 U/L (ref 15–41)
Albumin: 4.2 g/dL (ref 3.5–5.0)
Alkaline Phosphatase: 73 U/L (ref 38–126)
Anion gap: 11 (ref 5–15)
BUN: 11 mg/dL (ref 8–23)
CO2: 25 mmol/L (ref 22–32)
Calcium: 9.2 mg/dL (ref 8.9–10.3)
Chloride: 105 mmol/L (ref 98–111)
Creatinine, Ser: 0.85 mg/dL (ref 0.44–1.00)
GFR, Estimated: >60 mL/min (ref >60)
Glucose, Bld: 96 mg/dL (ref 70–99)
Potassium: 4.1 mmol/L (ref 3.5–5.1)
Sodium: 141 mmol/L (ref 135–145)
Total Bilirubin: 0.6 mg/dL (ref 0.3–1.2)
Total Protein: 7.6 g/dL (ref 6.5–8.1)

## 2020-05-26 MED ORDER — GABAPENTIN 300 MG PO CAPS: 300.0000 mg | ORAL_CAPSULE | Freq: Every day | ORAL | 4 refills | Status: DC

## 2020-05-26 MED ORDER — TAMOXIFEN CITRATE 20 MG PO TABS: 20.0000 mg | ORAL_TABLET | Freq: Every day | ORAL | 4 refills | Status: DC

## 2020-05-26 NOTE — Progress Notes (Signed)
[place]  [phone]) [phone] [phone]) [phone]     ID: Susan EVOLA DOB: 02-24-1956  MR#: [id number]  SJG#:[id number]  Patient Care Team: Celene Squibb, MD as PCP - General (Internal Medicine) Corby Villasenor, Virgie Dad, MD as Consulting Physician (Oncology) Erroll Luna, MD as Consulting Physician (General Surgery) Eppie Gibson, MD as Attending Physician (Radiation Oncology) Madelin Headings, DO as Referring Physician (Optometry) OTHER MD:  CHIEF COMPLAINT: Estrogen receptor positive breast cancer  CURRENT TREATMENT: Tamoxifen   INTERVAL HISTORY: Susan Christensen returns today for follow-up of her estrogen receptor positive breast cancer accompanied by her husband Susan Christensen.  She continues on tamoxifen.  She tolerates this generally well.  She does not really have hot flashes so much as she feels hot all the time. She does not have vaginal wetness issues.    Since her last visit, she has not undergone any additional studies. Her most recent mammography was on 04/30/2019 at [place].  She tells me that she tried to get it scheduled at any pen but was not able to.  REVIEW OF SYSTEMS: Susan Christensen continues to work full-time and to "take care of everybody" as Susan Christensen puts it.  Currently she is taking care of him as he had recent knee surgery.  She exercises by walking and she tries to get at least 8000 steps most days.  Detailed review of systems was otherwise stable  COVID 19 VACCINATION STATUS: Fully vaccinated with booster November 2021   HISTORY OF CURRENT ILLNESS: From the original intake note:  Drina F Heninger ("[name]") presented with a firm discolored right nipple. She underwent bilateral diagnostic mammography with tomography and right breast ultrasonography at [facility] on 03/30/2017 showing: breast density category C. On the right, an apparent mass enlarges and discolors the nipple. On the left, there are no discrete masses or areas of architectural  distortion. Ultrasonography showed a small lobulated solid mass in the right breast at 4 o'clock lower inner quadrant measuring 0.4 x 0.3 x 0.4 cm, located 3 cm from the nipple. There is also an area of apparent architectural distortion in the upper outer right breast. Sonographic evaluation of the right axilla shows no abnormal or enlarged lymph nodes.   Accordingly on 04/06/2017 she proceeded to biopsy of the right breast mass in question. The pathology from this procedure showed ([id number]): At the 4 o'clock lower inner, intraductal papilloma. In the upper outer section of the breast: there was benign breast tissue with usual ductal epithelial hyperplasia. However this was felt to be discordant.   She underwent right lumpectomy and nipple biopsy ([id number]) on 05/30/2017 which found invasive lobular carcinoma, grade II which was focally present in the superior margin. Prognostic indicators significant for: estrogen receptor, 90% positive and progesterone receptor, 95% positive, both with strong staining intensity. Proliferation marker Ki67 at 2%.  HER-2 was not amplified.  In the medial breast and right nipple, there was intraductal papilloma, no evidence of malignancy.  Subsequently she underwent right margin clearing and sentinel lymph node sampling ([id number]) on 06/14/2017.  The margin was clear and the single sentinel lymph node was negative.  The patient's subsequent history is as detailed below.   PAST MEDICAL HISTORY: Past Medical History:  Diagnosis Date  . Allergy   . Asthma   . Cancer [facility])    Breast cancer  . Environmental and seasonal allergies   . Fatty liver   . GERD (gastroesophageal reflux disease)   . History of kidney stones   . History  of radiation therapy 07/17/17- 08/11/17   40.05 Gy directed to the right breast in 15 fractions, followed by Boost 10 Gy in 5 fractions  . Hypertension   . Papilloma of breast    right  Hyperlipidemia. Migraines at a [place] age.  She  denies having emphysema or heart issues.    PAST SURGICAL HISTORY: Past Surgical History:  Procedure Laterality Date  . BREAST BIOPSY Right 05/30/2017   Procedure: RIGHT NIPPLE BIOPSY;  Surgeon: Erroll Luna, MD;  Location: [place];  Service: General;  Laterality: Right;  . BREAST LUMPECTOMY WITH RADIOACTIVE SEED LOCALIZATION Right 05/30/2017   Procedure: RIGHT BREAST LUMPECTOMY WITH RADIOACTIVE SEED LOCALIZATION x2;  Surgeon: Erroll Luna, MD;  Location: [place];  Service: General;  Laterality: Right;  . BREAST LUMPECTOMY WITH SENTINEL LYMPH NODE BIOPSY Right 06/14/2017   Procedure: RE-EXCISION OF RIGHT BREAST LUMPECTOMY WITH RIGHT SENTINEL LYMPH NODE BX;  Surgeon: Erroll Luna, MD;  Location: [place];  Service: General;  Laterality: Right;  . CESAREAN SECTION    . COLONOSCOPY  2009   Dr. Oneida Alar: normal. Screening in 10 years  . CYST REMOVAL HAND Left     FAMILY HISTORY Family History  Problem Relation Age of Onset  . Heart attack Father   . Heart disease Father   . Hypertension Father   . Heart disease Mother        has pacemaker  . Stroke Mother   . Colon cancer Neg Hx   . Colon polyps Neg Hx   . Breast cancer Neg Hx   . Allergic rhinitis Neg Hx   . Angioedema Neg Hx   . Asthma Neg Hx   . Atopy Neg Hx   . Eczema Neg Hx   . Immunodeficiency Neg Hx   . Urticaria Neg Hx   The patient's mother is 35 years old as of March 2019. The patient's father passed away due to heart issues at age 38. The patient has one brother. The patient's sister passed away in her 67's with down syndrome. The patient has a maternal aunt that was diagnosed with breast cancer in her 27's. The patient's cousin (who was the daughter of the aunt with breast cancer) passed away at a [place] age due to melanoma.    GYNECOLOGIC HISTORY:  No LMP recorded. Patient is postmenopausal. Menarche: 65 years old Age at first live birth: 65 years old The patient is GxP1. Her  LMP was around the age of 10 that lingered for a few years starting at 78.  She used oral contraceptive pills for about 6 years in order to regulate her periods. She denies having any issues.  She did not use HRT.   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 Her husband, Susan Christensen, is an Best boy. The patient's daughter, Judson Roch, is completing 4 years college at [employer]. The patient does not have grandchildren. The patient attends [employer] in [place].    ADVANCED DIRECTIVES: The patient's husband is her healthcare power of attorney   HEALTH MAINTENANCE: Social History   Tobacco Use  . Smoking status: Never Smoker  . Smokeless tobacco: Never Used  Vaping Use  . Vaping Use: Never used  Substance Use Topics  . Alcohol use: Yes    Comment: rare   . Drug use: No     Colonoscopy: about 9 years ago  PAP: not UTD  Bone density: no   Allergies  Allergen Reactions  . Effexor [Venlafaxine] Swelling  . Shellfish Allergy Anaphylaxis    Current Outpatient Medications  Medication Sig Dispense Refill  . albuterol (PROAIR HFA) 108 (90 Base) MCG/ACT inhaler Inhale 2 puffs into the lungs every 6 (six) hours as needed for wheezing or shortness of breath. 18 g 5  . amLODipine (NORVASC) 10 MG tablet Take 1 tablet (10 mg total) by mouth daily. 90 tablet 1  . benazepril (LOTENSIN) 20 MG tablet Take 1 tablet (20 mg total) by mouth daily. 90 tablet 1  . BREO ELLIPTA 200-25 MCG/INH AEPB INHALE 1 PUFF INTO THE LUNGS DAILY 60 each 5  . cholecalciferol (VITAMIN D) 1000 UNITS tablet Take 1,000 Units by mouth daily.    . fluticasone (FLONASE) 50 MCG/ACT nasal spray Place 1 spray into both nostrils daily.     Marland Kitchen gabapentin (NEURONTIN) 300 MG capsule Take 1 capsule (300 mg total) by mouth at bedtime. 90 capsule 4  . Propylene Glycol (SYSTANE BALANCE OP) Apply 1 drop to eye every morning. Pt may use up to 4 times  daily prn    . tamoxifen (NOLVADEX) 20 MG tablet Take 1 tablet (20 mg total) by mouth daily. 90 tablet 4   No current facility-administered medications for this visit.    OBJECTIVE: White woman who appears younger than stated age  1:   05/26/20 1512  BP: (!) 157/62  Pulse: 91  Resp: 18  Temp: 98.1 F (36.7 C)  SpO2: 98%     Body mass index is 27.16 kg/m.   Wt Readings from Last 3 Encounters:  05/26/20 144 lb 3.2 oz (65.4 kg)  08/14/19 142 lb 12.8 oz (64.8 kg)  06/27/19 140 lb 9.6 oz (63.8 kg)      ECOG FS:1 - Symptomatic but completely ambulatory  Sclerae unicteric, EOMs intact Wearing a mask No cervical or supraclavicular adenopathy Lungs no rales or rhonchi Heart regular rate and rhythm Abd soft, nontender, positive bowel sounds MSK no focal spinal tenderness, no upper extremity lymphedema Neuro: nonfocal, well oriented, appropriate affect Breasts: Right breast is status post lumpectomy and radiation.  There is no evidence of local recurrence.  The left breast is benign.  Both axillae are benign.   LAB RESULTS:  CMP     Component Value Date/Time   NA 141 05/26/2020 1456   K 4.1 05/26/2020 1456   CL 105 05/26/2020 1456   CO2 25 05/26/2020 1456   GLUCOSE 96 05/26/2020 1456   BUN 11 05/26/2020 1456   CREATININE 0.85 05/26/2020 1456   CALCIUM 9.2 05/26/2020 1456   PROT 7.6 05/26/2020 1456   ALBUMIN 4.2 05/26/2020 1456   AST 22 05/26/2020 1456   ALT 24 05/26/2020 1456   ALKPHOS 73 05/26/2020 1456   BILITOT 0.6 05/26/2020 1456   GFRNONAA >60 05/26/2020 1456   GFRAA >60 05/13/2019 1456    Lab Results  Component Value Date   WBC 10.5 05/26/2020   NEUTROABS 7.2 05/26/2020   HGB 14.6 05/26/2020   HCT 43.6 05/26/2020   MCV 93.0 05/26/2020   PLT 271 05/26/2020   No results found for: LABCA2  No components found for: HGDJME268  No results for input(s): INR in the last 168 hours.  No results found for: LABCA2  No results found for: TMH962  No  results found for: IWL798  No results found for: XQJ194  No results found for: CA2729  No components found for: HGQUANT  No results found for: CEA1 / No results found for:  CEA1   No results found for: AFPTUMOR  No results found for: CHROMOGRNA  No results found for: TOTALPROTELP, ALBUMINELP, A1GS, A2GS, BETS, BETA2SER, GAMS, MSPIKE, SPEI (this displays SPEP labs)  No results found for: KPAFRELGTCHN, LAMBDASER, KAPLAMBRATIO (kappa/lambda light chains)  No results found for: HGBA, HGBA2QUANT, HGBFQUANT, HGBSQUAN (Hemoglobinopathy evaluation)   No results found for: LDH  No results found for: IRON, TIBC, IRONPCTSAT (Iron and TIBC)  No results found for: FERRITIN  Urinalysis No results found for: COLORURINE, APPEARANCEUR, LABSPEC, PHURINE, GLUCOSEU, HGBUR, BILIRUBINUR, KETONESUR, PROTEINUR, UROBILINOGEN, NITRITE, LEUKOCYTESUR   STUDIES: No results found.   ELIGIBLE FOR AVAILABLE RESEARCH PROTOCOL: no  ASSESSMENT: 65 y.o. [place], [place] woman status post right breast inner lower quadrant pT1a pNX, stage IA invasive lobular carcinoma, grade 1, estrogen and progesterone receptor positive, HER-2 not amplified, with an MIB-1 of 2%  (1) positive margin cleared with additional surgery 06/14/2017; sentinel lymph node (single) was negative (pT1a pN0)  (2) adjuvant radiation 07/17/17 - 08/11/17 Site/dose:   40.05 Gy directed to the right breast in 15 fractions, followed by 10 Gy in 5 fractions.  (3) tamoxifen started 09/13/2017  (a) venlafaxine 37.5 mg XR added 12/12/2017--discontinued due to side effects  (b) gabapentin 300 mg p.o. nightly added 05/09/2018   PLAN: Susan Christensen is now just about 3 years out from definitive surgery for her breast cancer with no evidence of disease recurrence.  This is very favorable.  She is tolerating tamoxifen well and the plan is to continue that a total of 5 years.  I have set her up for mammography later this month at [facility].  She also would  like to change follow-up to the [facility] clinic under Dr. Delton Coombes.  I have put in that request.  Her next visit, which will be with him, will be in mid March 2023  She knows to call for any other issue that may develop before then  Total encounter time 35 minutes.*  Esgar Barnick, Virgie Dad, MD  05/26/20 5:27 PM Medical Oncology and Hematology [facility] [place], [place] [address] Tel. [phone]    Fax. [phone]   I, Wilburn Mylar, am acting as scribe for Dr. Virgie Dad. Brinden Kincheloe.  I, Lurline Del MD, have reviewed the above documentation for accuracy and completeness, and I agree with the above.   *Total Encounter Time as defined by the Centers for Medicare and Medicaid Services includes, in addition to the face-to-face time of a patient visit (documented in the note above) non-face-to-face time: obtaining and reviewing outside history, ordering and reviewing medications, tests or procedures, care coordination (communications with other health care professionals or caregivers) and documentation in the medical record.

## 2020-06-11 ENCOUNTER — Telehealth: Payer: Self-pay

## 2020-06-11 NOTE — Telephone Encounter (Signed)
Left pt contact information for central scheduling to set up her mammogram.

## 2020-06-19 ENCOUNTER — Other Ambulatory Visit (HOSPITAL_COMMUNITY): Payer: Self-pay | Admitting: Oncology

## 2020-06-19 DIAGNOSIS — C50311 Malignant neoplasm of lower-inner quadrant of right female breast: Secondary | ICD-10-CM

## 2020-06-19 DIAGNOSIS — Z170 Estrogen receptor positive status [ER+]: Secondary | ICD-10-CM

## 2020-07-02 DIAGNOSIS — Z853 Personal history of malignant neoplasm of breast: Secondary | ICD-10-CM | POA: Diagnosis not present

## 2020-07-02 DIAGNOSIS — E785 Hyperlipidemia, unspecified: Secondary | ICD-10-CM | POA: Diagnosis not present

## 2020-07-02 DIAGNOSIS — R7301 Impaired fasting glucose: Secondary | ICD-10-CM | POA: Diagnosis not present

## 2020-07-02 DIAGNOSIS — E559 Vitamin D deficiency, unspecified: Secondary | ICD-10-CM | POA: Diagnosis not present

## 2020-07-02 DIAGNOSIS — Z803 Family history of malignant neoplasm of breast: Secondary | ICD-10-CM | POA: Diagnosis not present

## 2020-07-02 DIAGNOSIS — J302 Other seasonal allergic rhinitis: Secondary | ICD-10-CM | POA: Diagnosis not present

## 2020-07-06 DIAGNOSIS — J4540 Moderate persistent asthma, uncomplicated: Secondary | ICD-10-CM | POA: Diagnosis not present

## 2020-07-06 DIAGNOSIS — J302 Other seasonal allergic rhinitis: Secondary | ICD-10-CM | POA: Diagnosis not present

## 2020-07-06 DIAGNOSIS — I10 Essential (primary) hypertension: Secondary | ICD-10-CM | POA: Diagnosis not present

## 2020-07-06 DIAGNOSIS — E785 Hyperlipidemia, unspecified: Secondary | ICD-10-CM | POA: Diagnosis not present

## 2020-07-14 ENCOUNTER — Ambulatory Visit (HOSPITAL_COMMUNITY)
Admission: RE | Admit: 2020-07-14 | Discharge: 2020-07-14 | Disposition: A | Discharge status: Home / Self Care | Payer: BC Managed Care – PPO | Source: Ambulatory Visit | Attending: Oncology | Admitting: Oncology

## 2020-07-14 ENCOUNTER — Other Ambulatory Visit: Payer: Self-pay

## 2020-07-14 DIAGNOSIS — C50311 Malignant neoplasm of lower-inner quadrant of right female breast: Secondary | ICD-10-CM | POA: Insufficient documentation

## 2020-07-14 DIAGNOSIS — Z170 Estrogen receptor positive status [ER+]: Secondary | ICD-10-CM | POA: Diagnosis not present

## 2020-07-14 DIAGNOSIS — R922 Inconclusive mammogram: Secondary | ICD-10-CM | POA: Diagnosis not present

## 2020-07-14 DIAGNOSIS — Z853 Personal history of malignant neoplasm of breast: Secondary | ICD-10-CM | POA: Diagnosis not present

## 2020-07-28 ENCOUNTER — Other Ambulatory Visit: Payer: Self-pay | Admitting: Family Medicine

## 2020-07-30 ENCOUNTER — Other Ambulatory Visit: Payer: Self-pay | Admitting: Oncology

## 2020-08-21 ENCOUNTER — Encounter: Payer: Self-pay | Admitting: Allergy & Immunology

## 2020-08-21 ENCOUNTER — Other Ambulatory Visit: Payer: Self-pay

## 2020-08-21 ENCOUNTER — Ambulatory Visit: Payer: BC Managed Care – PPO | Admitting: Allergy & Immunology

## 2020-08-21 VITALS — BP 158/78 | HR 97 | Temp 98.7°F | Resp 16 | Ht 61.0 in | Wt 147.4 lb

## 2020-08-21 DIAGNOSIS — J4540 Moderate persistent asthma, uncomplicated: Secondary | ICD-10-CM | POA: Diagnosis not present

## 2020-08-21 DIAGNOSIS — T7800XD Anaphylactic reaction due to unspecified food, subsequent encounter: Secondary | ICD-10-CM

## 2020-08-21 DIAGNOSIS — J310 Chronic rhinitis: Secondary | ICD-10-CM

## 2020-08-21 MED ORDER — BREO ELLIPTA 200-25 MCG/INH IN AEPB: 1.0000 | INHALATION_SPRAY | Freq: Every day | RESPIRATORY_TRACT | 5 refills | Status: DC

## 2020-08-21 NOTE — Patient Instructions (Addendum)
1. Mild persistent asthma, uncomplicated - Lung testing still looks stable. - Samples of Trelegy provided (contains Breo + another medication to help your lungs stay open).  - Daily controller medication(s): Breo 200/21mcg one puff once daily - Prior to physical activity: albuterol 2 puffs 10-15 minutes before physical activity. - Rescue medications: albuterol 4 puffs every 4-6 hours as needed - Asthma control goals:  * Full participation in all desired activities (may need albuterol before activity) * Albuterol use two time or less a week on average (not counting use with activity) * Cough interfering with sleep two time or less a month * Oral steroids no more than once a year * No hospitalizations  2. Chronic rhinitis - Continue with fluticasone 1-2 sprays per nostril daily.  - We can do allergy testing again in the future if needed.   3. Anaphylactic shock due to food (crab, [employer]	[place]) - Continue to avoid these foods. - EpiPen sent in out of an abundance of caution.  4. Return in about 6 months (around 02/21/2021).    Please inform us of any Emergency Department visits, hospitalizations, or changes in symptoms. Call us before going to the ED for breathing or allergy symptoms since we might be able to fit you in for a sick visit. Feel free to contact us anytime with any questions, problems, or concerns.  It was a pleasure to see you again today!  Websites that have reliable patient information: 1. American Academy of Asthma, Allergy, and Immunology: www.aaaai.org 2. Food Allergy Research and Education (FARE): foodallergy.org 3. Mothers of Asthmatics: http://www.asthmacommunitynetwork.org 4. American College of Allergy, Asthma, and Immunology: www.acaai.org   COVID-19 Vaccine Information can be found at: ShippingScam.co.uk For questions related to vaccine distribution or appointments, please email [email or web address] or  call [phone].   We realize that you might be concerned about having an allergic reaction to the COVID19 vaccines. To help with that concern, WE ARE OFFERING THE COVID19 VACCINES IN OUR OFFICE! Ask the front desk for dates!     "Like" [place] on Facebook and Instagram for our latest updates!      A healthy democracy works best when [employer] participate! Make sure you are registered to vote! If you have moved or changed any of your contact information, you will need to get this updated before voting!  In some cases, you MAY be able to register to vote online: [email or web address]

## 2020-08-21 NOTE — Progress Notes (Signed)
FOLLOW UP  Date of Service/Encounter:  08/21/20   Assessment:   Moderatepersistent asthma, uncomplicated  Persistent obstructive lung defect - full PFTs in the future?  Chronic rhinitis  Anaphylactic shock due to food(crab, [employer]	[place])  Fully vaccinated to Susan Christensen) - needs booster   Plan/Recommendations:   1. Mild persistent asthma, uncomplicated - Lung testing still looks stable. - Samples of Trelegy provided (contains Breo + another medication to help your lungs stay open).  - Daily controller medication(s): Breo 200/10mcg one puff once daily - Prior to physical activity: albuterol 2 puffs 10-15 minutes before physical activity. - Rescue medications: albuterol 4 puffs every 4-6 hours as needed - Asthma control goals:  * Full participation in all desired activities (may need albuterol before activity) * Albuterol use two time or less a week on average (not counting use with activity) * Cough interfering with sleep two time or less a month * Oral steroids no more than once a year * No hospitalizations  2. Chronic rhinitis - Continue with fluticasone 1-2 sprays per nostril daily.  - We can do Christensen testing again in the future if needed.   3. Anaphylactic shock due to food (crab, [employer]	[place]) - Continue to avoid these foods. - EpiPen sent in out of an abundance of caution.  4. Return in about 6 months (around 02/21/2021).    Subjective:   Susan Christensen is a 65 y.o. female presenting today for follow up of  Chief Complaint  Patient presents with  . Asthma    Susan Christensen has a history of the following: Patient Active Problem List   Diagnosis Date Noted  . Elevated glucose 06/27/2019  . Uncomplicated asthma 17/40/8144  . Hemorrhoids 06/27/2019  . Well adult exam 04/03/2019  . Glucose intolerance 05/09/2018  . Malignant neoplasm of lower-inner quadrant of right breast of female, estrogen receptor positive (Susan Christensen) 06/20/2017  . Fatty  liver 08/10/2016  . Hyperlipidemia 01/20/2009  . OTHER CHRONIC ALLERGIC CONJUNCTIVITIS 01/20/2009  . Essential hypertension 01/20/2009  . GESTATIONAL DIABETES 01/20/2009  . ROSACEA 01/20/2009  . LIVER FUNCTION TESTS, ABNORMAL, HX OF 01/20/2009  . ABNORMAL TRANSAMINASE, (LFT'S) 07/04/2008  . Susan Christensen Christensen 07/04/2008    History obtained from: chart review and patient.  Susan Christensen is a 65 y.o. female presenting for a follow up visit. She was last seen in November 2021. At that time, her lung testing looked abnormal, but it had been trending higher. We continued with Breo one puff once daily as well as albuterol as needed. For her rhinitis, we continued with fluticasone and discussed redoing Christensen testing. We recommended continued avoidance of crab and mahi mahi.   Since the last visit, Susan Christensen has done very well. She has done very well since that time. She is not having much in the way of asthma problems.   Asthma/Respiratory Symptom History: She is on the Breo one puff once daily. She is not using her rescue inhalers at all. She has not needed prednisone at all. Susan Christensen asthma has been well controlled. She has not required rescue medication, experienced nocturnal awakenings due to lower respiratory symptoms, nor have activities of daily living been limited. She has required no Emergency Department or Urgent Care visits for her asthma. She has required zero courses of systemic steroids for asthma exacerbations since the last visit. ACT score today is 23, indicating excellent asthma symptom control.  She truly feels like her asthma is under excellent control.  Allergic Rhinitis Symptom History: She is  having some stuffy nose. She does use the fluticasone every day.  She has not needed antibiotics.  Overall, she is doing fairly well.  She will occasionally use a nasal saline rinse.  Otherwise, there have been no changes to her past medical history, surgical history, family history, or social  history.    Review of Systems  Constitutional: Negative.  Negative for chills, fever, malaise/fatigue and weight loss.  HENT: Positive for congestion. Negative for ear discharge, ear pain and sinus pain.   Eyes: Negative for pain, discharge and redness.  Respiratory: Negative for cough, sputum production, shortness of breath and wheezing.   Cardiovascular: Negative.  Negative for chest pain and palpitations.  Gastrointestinal: Negative for abdominal pain, constipation, diarrhea, heartburn, nausea and vomiting.  Skin: Negative.  Negative for itching and rash.  Neurological: Negative for dizziness and headaches.  Endo/Heme/Allergies: Positive for environmental allergies. Does not bruise/bleed easily.       Objective:   Blood pressure (!) 158/78, pulse 97, temperature 98.7 F (37.1 C), temperature source Temporal, resp. rate 16, height 5\' 1"  (1.549 m), weight 147 lb 6.4 oz (66.9 kg), SpO2 97 %. Body mass index is 27.85 kg/m.   Physical Exam:  Physical Exam Constitutional:      Appearance: She is well-developed.     Comments: Smiling and pleasant.  HENT:     Head: Normocephalic and atraumatic.     Right Ear: Tympanic membrane, ear canal and external ear normal.     Left Ear: Tympanic membrane, ear canal and external ear normal.     Nose: No nasal deformity, septal deviation, mucosal edema or rhinorrhea.     Right Turbinates: Enlarged and swollen.     Left Turbinates: Enlarged and swollen.     Right Sinus: No maxillary sinus tenderness or frontal sinus tenderness.     Left Sinus: No maxillary sinus tenderness or frontal sinus tenderness.     Mouth/Throat:     Mouth: Mucous membranes are not pale and not dry.     Pharynx: Uvula midline.  Eyes:     General:        Right eye: No discharge.        Left eye: No discharge.     Conjunctiva/sclera: Conjunctivae normal.     Right eye: Right conjunctiva is not injected. No chemosis.    Left eye: Left conjunctiva is not injected. No  chemosis.    Pupils: Pupils are equal, round, and reactive to light.  Cardiovascular:     Rate and Rhythm: Normal rate and regular rhythm.     Heart sounds: Normal heart sounds.  Pulmonary:     Effort: Pulmonary effort is normal. No tachypnea, accessory muscle usage or respiratory distress.     Breath sounds: Normal breath sounds. No wheezing, rhonchi or rales.     Comments: Moving air well in all lung fields.  No increased work of breathing. Chest:     Chest wall: No tenderness.  Lymphadenopathy:     Cervical: No cervical adenopathy.  Skin:    General: Skin is warm.     Capillary Refill: Capillary refill takes less than 2 seconds.     Coloration: Skin is not pale.     Findings: No abrasion, erythema, petechiae or rash. Rash is not papular, urticarial or vesicular.     Comments: No eczematous or urticarial lesions noted.  Neurological:     Mental Status: She is alert.      Diagnostic studies:    Spirometry: results  abnormal (FEV1: 1.28/60%, FVC: 1.99/73%, FEV1/FVC: 64%).    Spirometry consistent with mixed obstructive and restrictive disease.   Christensen Studies: none       Salvatore Marvel, MD  [facility] [place] [facility] [place]

## 2020-08-25 ENCOUNTER — Encounter: Payer: Self-pay | Admitting: Allergy & Immunology

## 2020-10-08 DIAGNOSIS — Z23 Encounter for immunization: Secondary | ICD-10-CM | POA: Diagnosis not present

## 2020-12-30 DIAGNOSIS — R7301 Impaired fasting glucose: Secondary | ICD-10-CM | POA: Diagnosis not present

## 2020-12-30 DIAGNOSIS — E559 Vitamin D deficiency, unspecified: Secondary | ICD-10-CM | POA: Diagnosis not present

## 2020-12-30 DIAGNOSIS — E785 Hyperlipidemia, unspecified: Secondary | ICD-10-CM | POA: Diagnosis not present

## 2021-01-06 ENCOUNTER — Other Ambulatory Visit (HOSPITAL_COMMUNITY): Payer: Self-pay | Admitting: Internal Medicine

## 2021-01-06 DIAGNOSIS — Z23 Encounter for immunization: Secondary | ICD-10-CM | POA: Diagnosis not present

## 2021-01-06 DIAGNOSIS — J4540 Moderate persistent asthma, uncomplicated: Secondary | ICD-10-CM | POA: Diagnosis not present

## 2021-01-06 DIAGNOSIS — I10 Essential (primary) hypertension: Secondary | ICD-10-CM | POA: Diagnosis not present

## 2021-01-06 DIAGNOSIS — E785 Hyperlipidemia, unspecified: Secondary | ICD-10-CM | POA: Diagnosis not present

## 2021-01-06 DIAGNOSIS — Z0000 Encounter for general adult medical examination without abnormal findings: Secondary | ICD-10-CM

## 2021-01-06 DIAGNOSIS — J302 Other seasonal allergic rhinitis: Secondary | ICD-10-CM | POA: Diagnosis not present

## 2021-01-25 ENCOUNTER — Other Ambulatory Visit: Payer: Self-pay

## 2021-01-25 ENCOUNTER — Ambulatory Visit (HOSPITAL_COMMUNITY)
Admission: RE | Admit: 2021-01-25 | Discharge: 2021-01-25 | Disposition: A | Discharge status: Home / Self Care | Payer: BC Managed Care – PPO | Source: Ambulatory Visit | Attending: Internal Medicine | Admitting: Internal Medicine

## 2021-01-25 DIAGNOSIS — M8589 Other specified disorders of bone density and structure, multiple sites: Secondary | ICD-10-CM | POA: Diagnosis not present

## 2021-01-25 DIAGNOSIS — Z0000 Encounter for general adult medical examination without abnormal findings: Secondary | ICD-10-CM | POA: Diagnosis not present

## 2021-01-25 DIAGNOSIS — Z780 Asymptomatic menopausal state: Secondary | ICD-10-CM | POA: Diagnosis not present

## 2021-02-23 ENCOUNTER — Other Ambulatory Visit: Payer: Self-pay | Admitting: Allergy & Immunology

## 2021-02-24 ENCOUNTER — Other Ambulatory Visit: Payer: Self-pay

## 2021-02-24 ENCOUNTER — Ambulatory Visit: Payer: BC Managed Care – PPO | Admitting: Allergy & Immunology

## 2021-02-24 VITALS — BP 150/90 | HR 88 | Temp 98.5°F | Resp 16 | Ht 61.0 in | Wt 144.2 lb

## 2021-02-24 DIAGNOSIS — T7800XD Anaphylactic reaction due to unspecified food, subsequent encounter: Secondary | ICD-10-CM

## 2021-02-24 DIAGNOSIS — J4540 Moderate persistent asthma, uncomplicated: Secondary | ICD-10-CM

## 2021-02-24 DIAGNOSIS — J310 Chronic rhinitis: Secondary | ICD-10-CM | POA: Diagnosis not present

## 2021-02-24 NOTE — Patient Instructions (Addendum)
1. Mild persistent asthma, uncomplicated - Lung testing still looks stable. - We are going to change you to the lower dose Breo to see how you do (call us if your symptoms get worse).  - Daily controller medication(s): Breo 100/30mcg one puff once daily - Prior to physical activity: albuterol 2 puffs 10-15 minutes before physical activity. - Rescue medications: albuterol 4 puffs every 4-6 hours as needed - Asthma control goals:  * Full participation in all desired activities (may need albuterol before activity) * Albuterol use two time or less a week on average (not counting use with activity) * Cough interfering with sleep two time or less a month * Oral steroids no more than once a year * No hospitalizations  2. Chronic rhinitis - Continue with fluticasone 1-2 sprays per nostril daily.  - We can do allergy testing again in the future if needed.   3. Anaphylactic shock due to food (crab, [employer]	[place]) - Continue to avoid these foods. - EpiPen sent in out of an abundance of caution.  4. Return in about 6 months (around 08/24/2021).    Please inform us of any Emergency Department visits, hospitalizations, or changes in symptoms. Call us before going to the ED for breathing or allergy symptoms since we might be able to fit you in for a sick visit. Feel free to contact us anytime with any questions, problems, or concerns.  It was a pleasure to see you again today!  Websites that have reliable patient information: 1. American Academy of Asthma, Allergy, and Immunology: www.aaaai.org 2. Food Allergy Research and Education (FARE): foodallergy.org 3. Mothers of Asthmatics: http://www.asthmacommunitynetwork.org 4. American College of Allergy, Asthma, and Immunology: www.acaai.org   COVID-19 Vaccine Information can be found at: [email or web address].[email or web address].uk For questions related to vaccine distribution or appointments, please email  [email or web address] or call [phone].   We realize that you might be concerned about having an allergic reaction to the COVID19 vaccines. To help with that concern, WE ARE OFFERING THE COVID19 VACCINES IN OUR OFFICE! Ask the front desk for dates!     "Like" [place] on Facebook and Instagram for our latest updates!      A healthy democracy works best when [employer] participate! Make sure you are registered to vote! If you have moved or changed any of your contact information, you will need to get this updated before voting!  In some cases, you MAY be able to register to vote online: [email or web address]

## 2021-02-24 NOTE — Progress Notes (Signed)
FOLLOW UP  Date of Service/Encounter:  02/24/21   Assessment:   Moderate persistent asthma, uncomplicated    Persistent obstructive lung defect - full PFTs in the future?   Chronic rhinitis   Anaphylactic shock due to food (crab, [employer]	[place])   Fully vaccinated to [employer]) - needs booster     Plan/Recommendations:   1. Mild persistent asthma, uncomplicated - Lung testing still looks stable. - We are going to change you to the lower dose Breo to see how you do (call us if your symptoms get worse).  - Daily controller medication(s): Breo 100/64mcg one puff once daily - Prior to physical activity: albuterol 2 puffs 10-15 minutes before physical activity. - Rescue medications: albuterol 4 puffs every 4-6 hours as needed - Asthma control goals:  * Full participation in all desired activities (may need albuterol before activity) * Albuterol use two time or less a week on average (not counting use with activity) * Cough interfering with sleep two time or less a month * Oral steroids no more than once a year * No hospitalizations  2. Chronic rhinitis - Continue with fluticasone 1-2 sprays per nostril daily.  - We can do allergy testing again in the future if needed.   3. Anaphylactic shock due to food (crab, [employer]	[place]) - Continue to avoid these foods. - EpiPen sent in out of an abundance of caution.  4. Return in about 6 months (around 08/24/2021).   Subjective:   Susan Christensen is a 65 y.o. female presenting today for follow up of  Chief Complaint  Patient presents with   Asthma    6 mth f/u  -    Follow-up    Patient is in today for a follow up and states as long as she takes her medication she is not having any problems.    Susan Christensen has a history of the following: Patient Active Problem List   Diagnosis Date Noted   Elevated glucose 35/32/9924   Uncomplicated asthma 26/83/4196   Hemorrhoids 06/27/2019   Well adult exam 04/03/2019   Glucose  intolerance 05/09/2018   Malignant neoplasm of lower-inner quadrant of right breast of female, estrogen receptor positive ([place]) 06/20/2017   Fatty liver 08/10/2016   Hyperlipidemia 01/20/2009   OTHER CHRONIC ALLERGIC CONJUNCTIVITIS 01/20/2009   Essential hypertension 01/20/2009   GESTATIONAL DIABETES 01/20/2009   ROSACEA 01/20/2009   LIVER FUNCTION TESTS, ABNORMAL, HX OF 01/20/2009   ABNORMAL TRANSAMINASE, (LFT'S) 07/04/2008   SHELLFISH ALLERGY 07/04/2008    History obtained from: chart review and patient.  Susan Christensen is a 65 y.o. female presenting for a follow up visit.  He was last seen in May 2022.  At that time, her lung testing still looks stable.  We gave her more samples of Trelegy.  We continue with Flonase 1 to 2 sprays per nostril daily.  We recommended continued avoidance of crab and mahi-mahi.  Since last visit, she has done very well.  Asthma/Respiratory Symptom History: She is on the Breo one puff once daily. She reported that the Trelegy did not do any better and left a bad taste in her mouth.  She has been doing better on the [place]. She has not bene using her rescue inhaler much at all. She used it once in a blue moon but otherwise finds no need for it. Breo seems to be working well to help with her symptoms.   Allergic Rhinitis Symptom History: Allergy symptoms are doing well. She uses  the Flonase that keeps everything in check. She has had some eye watering over the last couple of weeks.  She has not needed antibiotics at all. She reports that she feels quite good.   Food Allergy Symptom History: She continues avoidance of crab and mahi mahi. She eats shrimp and flounder and tilapia.  She does have an up to date EpiPen. She is not interested in doing a challenge.   Otherwise, there have been no changes to her past medical history, surgical history, family history, or social history.    Review of Systems  Constitutional: Negative.  Negative for chills, fever,  malaise/fatigue and weight loss.  HENT: Negative.  Negative for congestion, ear discharge and ear pain.   Eyes:  Negative for pain, discharge and redness.  Respiratory:  Negative for cough, sputum production, shortness of breath and wheezing.   Cardiovascular: Negative.  Negative for chest pain and palpitations.  Gastrointestinal:  Negative for abdominal pain, constipation, diarrhea, heartburn, nausea and vomiting.  Skin: Negative.  Negative for itching and rash.  Neurological:  Negative for dizziness and headaches.  Endo/Heme/Allergies:  Negative for environmental allergies. Does not bruise/bleed easily.      Objective:   Blood pressure (!) 150/90, pulse 88, temperature 98.5 F (36.9 C), temperature source Temporal, resp. rate 16, height 5\' 1"  (1.549 m), weight 144 lb 3.2 oz (65.4 kg), SpO2 97 %. Body mass index is 27.25 kg/m.   Physical Exam:  Physical Exam Vitals reviewed.  Constitutional:      Appearance: She is well-developed.  HENT:     Head: Normocephalic and atraumatic.     Right Ear: Tympanic membrane, ear canal and external ear normal.     Left Ear: Tympanic membrane, ear canal and external ear normal.     Nose: No nasal deformity, septal deviation, mucosal edema or rhinorrhea.     Right Turbinates: Enlarged, swollen and pale.     Left Turbinates: Enlarged, swollen and pale.     Right Sinus: No maxillary sinus tenderness or frontal sinus tenderness.     Left Sinus: No maxillary sinus tenderness or frontal sinus tenderness.     Mouth/Throat:     Mouth: Mucous membranes are not pale and not dry.     Pharynx: Uvula midline.  Eyes:     General: Lids are normal. Allergic shiner present.        Right eye: No discharge.        Left eye: No discharge.     Conjunctiva/sclera: Conjunctivae normal.     Right eye: Right conjunctiva is not injected. No chemosis.    Left eye: Left conjunctiva is not injected. No chemosis.    Pupils: Pupils are equal, round, and reactive to  light.  Cardiovascular:     Rate and Rhythm: Normal rate and regular rhythm.     Heart sounds: Normal heart sounds.  Pulmonary:     Effort: Pulmonary effort is normal. No tachypnea, accessory muscle usage or respiratory distress.     Breath sounds: Normal breath sounds. No wheezing, rhonchi or rales.     Comments: Moving air well in all lung fields. No increased work of breathing noted.  Chest:     Chest wall: No tenderness.  Lymphadenopathy:     Cervical: No cervical adenopathy.  Skin:    Coloration: Skin is not pale.     Findings: No abrasion, erythema, petechiae or rash. Rash is not papular, urticarial or vesicular.  Neurological:     Mental Status: She  is alert.  Psychiatric:        Behavior: Behavior is cooperative.     Diagnostic studies:   Spirometry: results abnormal (FEV1: 1.41/67%, FVC: 2.23/83%, FEV1/FVC: 63%).    Spirometry consistent with moderate obstructive disease.   Allergy Studies: none     Salvatore Marvel, MD  [facility]

## 2021-02-25 ENCOUNTER — Encounter: Payer: Self-pay | Admitting: Allergy & Immunology

## 2021-02-25 MED ORDER — BREO ELLIPTA 100-25 MCG/ACT IN AEPB: 1.0000 | INHALATION_SPRAY | Freq: Every day | RESPIRATORY_TRACT | 5 refills | Status: DC

## 2021-02-25 NOTE — Addendum Note (Signed)
Addended by: Chip Boer R on: 02/25/2021 07:45 PM   Modules accepted: Orders

## 2021-02-26 DIAGNOSIS — Z23 Encounter for immunization: Secondary | ICD-10-CM | POA: Diagnosis not present

## 2021-04-27 DIAGNOSIS — E785 Hyperlipidemia, unspecified: Secondary | ICD-10-CM | POA: Diagnosis not present

## 2021-04-27 DIAGNOSIS — Z1211 Encounter for screening for malignant neoplasm of colon: Secondary | ICD-10-CM | POA: Diagnosis not present

## 2021-04-27 DIAGNOSIS — E1165 Type 2 diabetes mellitus with hyperglycemia: Secondary | ICD-10-CM | POA: Diagnosis not present

## 2021-04-29 DIAGNOSIS — J302 Other seasonal allergic rhinitis: Secondary | ICD-10-CM | POA: Diagnosis not present

## 2021-04-29 DIAGNOSIS — I10 Essential (primary) hypertension: Secondary | ICD-10-CM | POA: Diagnosis not present

## 2021-04-29 DIAGNOSIS — E785 Hyperlipidemia, unspecified: Secondary | ICD-10-CM | POA: Diagnosis not present

## 2021-04-29 DIAGNOSIS — J4540 Moderate persistent asthma, uncomplicated: Secondary | ICD-10-CM | POA: Diagnosis not present

## 2021-06-06 DIAGNOSIS — Z1211 Encounter for screening for malignant neoplasm of colon: Secondary | ICD-10-CM | POA: Diagnosis not present

## 2021-06-12 LAB — COLOGUARD: COLOGUARD: NEGATIVE

## 2021-06-12 LAB — EXTERNAL GENERIC LAB PROCEDURE: COLOGUARD: NEGATIVE

## 2021-07-29 ENCOUNTER — Other Ambulatory Visit: Payer: Self-pay | Admitting: Allergy & Immunology

## 2021-08-19 DIAGNOSIS — E785 Hyperlipidemia, unspecified: Secondary | ICD-10-CM | POA: Diagnosis not present

## 2021-08-19 DIAGNOSIS — E1165 Type 2 diabetes mellitus with hyperglycemia: Secondary | ICD-10-CM | POA: Diagnosis not present

## 2021-08-25 ENCOUNTER — Ambulatory Visit: Payer: BC Managed Care – PPO | Admitting: Allergy & Immunology

## 2021-08-25 ENCOUNTER — Encounter: Payer: Self-pay | Admitting: Allergy & Immunology

## 2021-08-25 ENCOUNTER — Other Ambulatory Visit: Payer: Self-pay

## 2021-08-25 VITALS — BP 142/72 | HR 92 | Temp 98.7°F

## 2021-08-25 DIAGNOSIS — J310 Chronic rhinitis: Secondary | ICD-10-CM | POA: Diagnosis not present

## 2021-08-25 DIAGNOSIS — J4540 Moderate persistent asthma, uncomplicated: Secondary | ICD-10-CM | POA: Diagnosis not present

## 2021-08-25 DIAGNOSIS — T7800XD Anaphylactic reaction due to unspecified food, subsequent encounter: Secondary | ICD-10-CM

## 2021-08-25 MED ORDER — BREO ELLIPTA 100-25 MCG/ACT IN AEPB: 1.0000 | INHALATION_SPRAY | Freq: Every day | RESPIRATORY_TRACT | 5 refills | Status: DC

## 2021-08-25 MED ORDER — ALBUTEROL SULFATE HFA 108 (90 BASE) MCG/ACT IN AERS: 2.0000 | INHALATION_SPRAY | RESPIRATORY_TRACT | 1 refills | Status: DC | PRN

## 2021-08-25 NOTE — Patient Instructions (Addendum)
1. Mild persistent asthma, uncomplicated ?- Lung testing still looks stable. ?- We are not going to make any changes at this point in time. ?- Daily controller medication(s): Breo 100/57mg one puff once daily ?- Prior to physical activity: albuterol 2 puffs 10-15 minutes before physical activity. ?- Rescue medications: albuterol 4 puffs every 4-6 hours as needed ?- Asthma control goals:  ?* Full participation in all desired activities (may need albuterol before activity) ?* Albuterol use two time or less a week on average (not counting use with activity) ?* Cough interfering with sleep two time or less a month ?* Oral steroids no more than once a year ?* No hospitalizations ? ?2. Chronic rhinitis ?- Continue with fluticasone 1-2 sprays per nostril daily.  ? ?3. An[facility]actic shock due to [facility] ([facility] ?- Continue to avoid these foods. ?- EpiPen sent in out of an abundance of caution. ? ?4. Return in about 6 months (around 02/25/2022).  ? ? ?Please inform u[place]of any Emergency Department visits, hospitalizations, or changes in symptoms. Call u[place]abefore going to the ED for breathing or allergy symptoms since we might be able to fit you in for a sick visit. Feel free to contact u[place]anytime with any questions, problems, or concerns. ? ?It was a pleasure to see you again today! ? ?Websites that have reliable patient information: ?1. American Academy of Asthma, Allergy, and Immunology: www.aaaai.org ?2. Food Allergy Research and Education (FARE): foodallergy.org ?3. Mothers of Asthmatics: http://www.asthmacommunitynetwork.org ?4. [employer]of Allergy, Asthma, and Immunology: wMonthlyElectricBill.co.uk? ? ?COVID-19 Vaccine Information can be found at: [email or web address] questions related to vaccine distribution or appointments, please email [email or web address] or call [phone]  ? ?We realize that you might be concerned about having an allergic reaction  to the COVID19 vaccines. To help with that concern, WE ARE OFFERING THE COVID19 VACCINES IN OUR OFFICE! Ask the front desk for dates!  ? ? ? ??Like? u[place]on Facebook and Instagram for our latest updates!  ?  ? ? ?A healthy democracy works best when [employer] Insuranceparticipate! Make sure you are registered to vote! If you have moved or changed any of your contact information, you will need to get this updated before voting! ? ?In some cases, you MAY be able to register to vote online: [email or web address]? ? ? ? ?

## 2021-08-25 NOTE — Progress Notes (Signed)
FOLLOW UP  Date of Service/Encounter:  08/25/21   Assessment:   Moderate persistent asthma, uncomplicated    Persistent obstructive lung defect - full PFTs in the future?   Chronic rhinitis   Anaphylactic shock due to food (crab, [employer]	[place])   Fully vaccinated to [employer])    Susan Christensen continues to have persistent obstructive defect on her spirometry, but it appears completely stable.  I will think we need to make any changes at this point because she is clinically doing so well on the [facility].  I am disappointed that the [employer] is so expensive, but she is more than willing to pay because it makes her feel so much better.  We will continue with the albuterol as needed in addition to the [facility].  For her rhinitis, we will continue with Flonase.  This is controlling her symptoms very effectively.  She is getting continue to avoid her triggering fish.  I did send an EpiPen out of an abundance of caution.  Plan/Recommendations:    1. Mild persistent asthma, uncomplicated - Lung testing still looks stable. - We are not going to make any changes at this point in time. - Daily controller medication(s): Breo 100/92mg one puff once daily - Prior to physical activity: albuterol 2 puffs 10-15 minutes before physical activity. - Rescue medications: albuterol 4 puffs every 4-6 hours as needed - Asthma control goals:  * Full participation in all desired activities (may need albuterol before activity) * Albuterol use two time or less a week on average (not counting use with activity) * Cough interfering with sleep two time or less a month * Oral steroids no more than once a year * No hospitalizations  2. Chronic rhinitis - Continue with fluticasone 1-2 sprays per nostril daily.   3. Anaphylactic shock due to food (crab, [employer]	[place] - Continue to avoid these foods. - EpiPen sent in out of an abundance of caution.  4. Return in about 6 months (around 02/25/2022).     Subjective:    Susan KISERis a 66y.o. female presenting today for follow up of  Chief Complaint  Patient presents with   Asthma    Asthma has been doing fine since her last visit.  REFILL: BEthyle Tiedthas a history of the following: Patient Active Problem List   Diagnosis Date Noted   Elevated glucose 097/98/9211  Uncomplicated asthma 094/17/4081  Hemorrhoids 06/27/2019   Well adult exam 04/03/2019   Glucose intolerance 05/09/2018   Malignant neoplasm of lower-inner quadrant of right breast of female, estrogen receptor positive ([place] 06/20/2017   Fatty liver 08/10/2016   Hyperlipidemia 01/20/2009   OTHER CHRONIC ALLERGIC CONJUNCTIVITIS 01/20/2009   Essential hypertension 01/20/2009   GESTATIONAL DIABETES 01/20/2009   ROSACEA 01/20/2009   LIVER FUNCTION TESTS, ABNORMAL, HX OF 01/20/2009   ABNORMAL TRANSAMINASE, (LFT'S) 07/04/2008   SHELLFISH ALLERGY 07/04/2008    History obtained from: chart review and patient.  Susan Christensen a 66y.o. female presenting for a follow up visit.  She was last seen in November 2022.  At that time, lung testing looks stable.  We changed her to a lower dose of Breo 100 mcg 1 puff once daily and continue with albuterol 2 puffs every 4-6 hours as needed.  For her rhinitis, we continued with Flonase 1 to 2 sprays per nostril daily.  She continues to avoid crab and [employer]	[place]   Since the last visit, she has  done well.   Asthma/Respiratory Symptom History: She  remains on her Breo one puff once daily. She feels better this year than she has in the past. She has not used her albuterol in a number of days. She has not needed it in a long time. It is $50 per month but she is willing to pay the price since it has worked well. She has not felt that hte decrease in the dose has changed how well she is doing.   Allergic Rhinitis Symptom History: Allergies are under excellent control. She does sneeze intermittently. She is on Flonase every day. She does  not do any antihistamine at all. She will rarely take a Carcidin at night when she needs it for congestion. This is very rare.   She has has no sinus infections at all. She has not changed what she wants to do with her life.   She has no traveling plans at all. Her husband and daughter are going to a family wedding. Her mother is 25 years old and lives alone. She has macular degeneration and has a hearing problem.   She is now on amlodipine and the olmesartan for blood pressure control. She is on tamoxifen which she is going to take for a total of 5 years. She reports that the type of breast cancer that was very low risk. She had a lumpectomy and radiation for 4 weeks.   Otherwise, there have been no changes to her past medical history, surgical history, family history, or social history.    Review of Systems  Constitutional: Negative.  Negative for chills, fever, malaise/fatigue and weight loss.  HENT: Negative.  Negative for congestion, ear discharge, ear pain and sinus pain.   Eyes:  Negative for pain, discharge and redness.  Respiratory:  Positive for cough and shortness of breath. Negative for sputum production and wheezing.   Cardiovascular: Negative.  Negative for chest pain and palpitations.  Gastrointestinal:  Negative for abdominal pain, constipation, diarrhea, heartburn, nausea and vomiting.  Skin: Negative.  Negative for itching and rash.  Neurological:  Negative for dizziness and headaches.  Endo/Heme/Allergies:  Negative for environmental allergies. Does not bruise/bleed easily.      Objective:   Blood pressure (!) 142/72, pulse 92, temperature 98.7 F (37.1 C), SpO2 97 %. There is no height or weight on file to calculate BMI.    Physical Exam Vitals reviewed.  Constitutional:      Appearance: She is well-developed.     Comments: Very lovely as always.  HENT:     Head: Normocephalic and atraumatic.     Right Ear: Tympanic membrane, ear canal and external ear  normal.     Left Ear: Tympanic membrane, ear canal and external ear normal.     Nose: No nasal deformity, septal deviation, mucosal edema or rhinorrhea.     Right Turbinates: Enlarged, swollen and pale.     Left Turbinates: Enlarged, swollen and pale.     Right Sinus: No maxillary sinus tenderness or frontal sinus tenderness.     Left Sinus: No maxillary sinus tenderness or frontal sinus tenderness.     Mouth/Throat:     Lips: Pink.     Mouth: Mucous membranes are moist. Mucous membranes are not pale and not dry.     Pharynx: Uvula midline.  Eyes:     General: Lids are normal. Allergic shiner present.        Right eye: No discharge.        Left  eye: No discharge.     Conjunctiva/sclera: Conjunctivae normal.     Right eye: Right conjunctiva is not injected. No chemosis.    Left eye: Left conjunctiva is not injected. No chemosis.    Pupils: Pupils are equal, round, and reactive to light.  Cardiovascular:     Rate and Rhythm: Normal rate and regular rhythm.     Heart sounds: Normal heart sounds.  Pulmonary:     Effort: Pulmonary effort is normal. No tachypnea, accessory muscle usage or respiratory distress.     Breath sounds: Normal breath sounds. No wheezing, rhonchi or rales.     Comments: Moving air well in all lung fields. No increased work of breathing noted.  Chest:     Chest wall: No tenderness.  Lymphadenopathy:     Cervical: No cervical adenopathy.  Skin:    Coloration: Skin is not pale.     Findings: No abrasion, erythema, petechiae or rash. Rash is not papular, urticarial or vesicular.  Neurological:     Mental Status: She is alert.  Psychiatric:        Behavior: Behavior is cooperative.     Diagnostic studies:    Spirometry: results abnormal (FEV1: 1.44/68%, FVC: 1.98/74%, FEV1/FVC: 73%).    Spirometry consistent with moderate obstructive disease.   Allergy Studies: none        Salvatore Marvel, MD  [facility]

## 2021-09-09 DIAGNOSIS — J4540 Moderate persistent asthma, uncomplicated: Secondary | ICD-10-CM | POA: Diagnosis not present

## 2021-09-09 DIAGNOSIS — J302 Other seasonal allergic rhinitis: Secondary | ICD-10-CM | POA: Diagnosis not present

## 2021-09-09 DIAGNOSIS — I10 Essential (primary) hypertension: Secondary | ICD-10-CM | POA: Diagnosis not present

## 2021-09-09 DIAGNOSIS — E785 Hyperlipidemia, unspecified: Secondary | ICD-10-CM | POA: Diagnosis not present

## 2021-09-10 ENCOUNTER — Other Ambulatory Visit (HOSPITAL_COMMUNITY): Payer: Self-pay | Admitting: Internal Medicine

## 2021-09-10 DIAGNOSIS — R011 Cardiac murmur, unspecified: Secondary | ICD-10-CM

## 2021-09-10 DIAGNOSIS — Z1231 Encounter for screening mammogram for malignant neoplasm of breast: Secondary | ICD-10-CM

## 2021-09-16 ENCOUNTER — Ambulatory Visit (HOSPITAL_COMMUNITY)
Admission: RE | Admit: 2021-09-16 | Discharge: 2021-09-16 | Disposition: A | Discharge status: Home / Self Care | Payer: BC Managed Care – PPO | Source: Ambulatory Visit | Attending: Internal Medicine | Admitting: Internal Medicine

## 2021-09-16 DIAGNOSIS — Z1231 Encounter for screening mammogram for malignant neoplasm of breast: Secondary | ICD-10-CM | POA: Diagnosis not present

## 2021-09-21 ENCOUNTER — Ambulatory Visit (HOSPITAL_COMMUNITY)
Admission: RE | Admit: 2021-09-21 | Discharge: 2021-09-21 | Disposition: A | Discharge status: Home / Self Care | Payer: BC Managed Care – PPO | Source: Ambulatory Visit | Attending: Internal Medicine | Admitting: Internal Medicine

## 2021-09-21 DIAGNOSIS — R011 Cardiac murmur, unspecified: Secondary | ICD-10-CM | POA: Diagnosis not present

## 2021-09-21 LAB — ECHOCARDIOGRAM COMPLETE
AR max vel: 1.64 cm2
AV Area VTI: 1.63 cm2
AV Area mean vel: 1.63 cm2
AV Mean grad: 12 mmHg
AV Peak grad: 23 mmHg
Ao pk vel: 2.4 m/s
Area-P 1/2: 4.89 cm2
S' Lateral: 2.9 cm

## 2021-09-21 NOTE — Progress Notes (Signed)
*  PRELIMINARY RESULTS* Echocardiogram 2D Echocardiogram has been performed.  Susan Christensen 09/21/2021, 4:15 PM

## 2022-02-23 ENCOUNTER — Encounter: Payer: Self-pay | Admitting: Allergy & Immunology

## 2022-02-23 ENCOUNTER — Ambulatory Visit: Payer: BC Managed Care – PPO | Admitting: Allergy & Immunology

## 2022-02-23 VITALS — BP 160/80 | HR 97 | Temp 97.9°F | Resp 20 | Ht 61.0 in | Wt 148.4 lb

## 2022-02-23 DIAGNOSIS — T7800XD Anaphylactic reaction due to unspecified food, subsequent encounter: Secondary | ICD-10-CM | POA: Diagnosis not present

## 2022-02-23 DIAGNOSIS — J310 Chronic rhinitis: Secondary | ICD-10-CM | POA: Diagnosis not present

## 2022-02-23 DIAGNOSIS — J4540 Moderate persistent asthma, uncomplicated: Secondary | ICD-10-CM

## 2022-02-23 NOTE — Progress Notes (Unsigned)
FOLLOW UP  Date of Service/Encounter:  02/23/22   Assessment:   Moderate persistent asthma, uncomplicated    Persistent obstructive lung defect - stabilized, but considering full PFTs in the future   Chronic rhinitis   Anaphylactic shock due to food (crab, [employer]	[place])   Fully vaccinated to [employer])   Plan/Recommendations:    1. Mild persistent asthma, uncomplicated - Lung testing still looks stable. - We are not going to make any changes at this point in time. - You seem to have a good sense of your symptoms.  - Daily controller medication(s): Breo 200/57mg one puff once daily - Prior to physical activity: albuterol 2 puffs 10-15 minutes before physical activity. - Rescue medications: albuterol 4 puffs every 4-6 hours as needed - Asthma control goals:  * Full participation in all desired activities (may need albuterol before activity) * Albuterol use two time or less a week on average (not counting use with activity) * Cough interfering with sleep two time or less a month * Oral steroids no more than once a year * No hospitalizations  2. Chronic rhinitis - Continue with fluticasone 1-2 sprays per nostril daily.   3. Anaphylactic shock due to food (crab, [employer]	[place] - Continue to avoid these foods. - EpiPen sent in out of an abundance of caution.  4. Return in about 6 months (around 08/24/2022).    Subjective:   Susan SEPULVEDAis a 66y.o. female presenting today for follow up of  Chief Complaint  Patient presents with   Asthma    6 mth f/u - FINE!!!!!    Susan TARPLEYhas a history of the following: Patient Active Problem List   Diagnosis Date Noted   Elevated glucose 029/93/7169  Uncomplicated asthma 067/89/3810  Hemorrhoids 06/27/2019   Well adult exam 04/03/2019   Glucose intolerance 05/09/2018   Malignant ne[place] of lower-inner quadrant of right breast of female, [place] positive [place] 06/[place]   Fatty liver 08/10/2016    Hyperlipidemia 01/20/2009   OTHER CHRONIC ALLERGIC CONJUNCTIVITIS 01/20/2009   Essential hypertension 01/20/2009   GESTATIONAL DIABETES 01/20/2009   ROSACEA 01/20/2009   LIVER FUNCTION TESTS, ABNORMAL, HX OF 01/20/2009   ABNORMAL TRANSAMINASE, (LFT'S) 07/04/2008   SHELLFISH ALLERGY 07/04/2008    History obtained from: chart review and patient.  [name] a 66y.o. female presenting for a follow up visit.  She was last seen in May 2023.  At that time, her lung testing looks stable.  We continue with Breo 200 mcg 1 puff once daily as well as albuterol as needed.  For her rhinitis, we continue with Flonase 1 to 2 sprays per nostril daily.  She continued to avoid seafood and we sent in an EpiPen.  Since last visit, she has largely done well. She is in her normal joyful state today, which is a welcome addition to my clinic day.   Asthma/Respiratory Symptom History: She remains on the Breo one puff once daily. She does this every day without fail.  This is $50 per month. She has not needed to use the rescue medication at all since the last visit. She is fine with paying this much although it seems a bit steep for me. She does not want to change medications to save a few dollars, but she is open to seeing if there is a company assistance program. Susan Christensen one of our CMAs, is looking into this.   Allergic Rhinitis Symptom History: She has done  fairly well with her nose. She has had some snotty discharge lately, but otherwise has been fine. She remains on the fluticasone daily.  She has not been on antibiotics at all for any sinus infections.   Food Allergy Symptom History: She eats flounder and white fish. She does not mess with the [facility].  She has an up to date EpiPen. She has not needed to use it at all.   Her mother is living on her own and will be 100 in January 2024. She is planning something big for this event. Her father on the other hand died when he was 77 from a massive heart  attack. He did not do a good job of taking care of himself.   Otherwise, there have been no changes to her past medical history, surgical history, family history, or social history.    Review of Systems  Constitutional: Negative.  Negative for chills, fever, malaise/fatigue and weight loss.  HENT: Negative.  Negative for congestion, ear discharge, ear pain and sinus pain.   Eyes:  Negative for pain, discharge and redness.  Respiratory:  Negative for cough, sputum production, shortness of breath and wheezing.   Cardiovascular: Negative.  Negative for chest pain and palpitations.  Gastrointestinal:  Negative for abdominal pain, constipation, diarrhea, heartburn, nausea and vomiting.  Skin: Negative.  Negative for itching and rash.  Neurological:  Negative for dizziness and headaches.  Endo/Heme/Allergies:  Negative for environmental allergies. Does not bruise/bleed easily.       Objective:   Blood pressure (!) 160/80, pulse 97, temperature 97.9 F (36.6 C), resp. rate 20, height '5\' 1"'$  (1.549 m), weight 148 lb 6.4 oz (67.3 kg), SpO2 97 %. Body mass index is 28.04 kg/m.    Physical Exam Vitals reviewed.  Constitutional:      Appearance: She is well-developed.     Comments: Very lovely as always.  HENT:     Head: Normocephalic and atraumatic.     Right Ear: Tympanic membrane, ear canal and external ear normal.     Left Ear: Tympanic membrane, ear canal and external ear normal.     Nose: No nasal deformity, septal deviation, mucosal edema or rhinorrhea.     Right Turbinates: Enlarged, swollen and pale.     Left Turbinates: Enlarged, swollen and pale.     Right Sinus: No maxillary sinus tenderness or frontal sinus tenderness.     Left Sinus: No maxillary sinus tenderness or frontal sinus tenderness.     Mouth/Throat:     Lips: Pink.     Mouth: Mucous membranes are moist. Mucous membranes are not pale and not dry.     Pharynx: Uvula midline.  Eyes:     General: Lids are  normal. Allergic shiner present.        Right eye: No discharge.        Left eye: No discharge.     Conjunctiva/sclera: Conjunctivae normal.     Right eye: Right conjunctiva is not injected. No chemosis.    Left eye: Left conjunctiva is not injected. No chemosis.    Pupils: Pupils are equal, round, and reactive to light.  Cardiovascular:     Rate and Rhythm: Normal rate and regular rhythm.     Heart sounds: Normal heart sounds.  Pulmonary:     Effort: Pulmonary effort is normal. No tachypnea, accessory muscle usage or respiratory distress.     Breath sounds: Normal breath sounds. No wheezing, rhonchi or rales.     Comments:  Moving air well in all lung fields. No increased work of breathing noted.  Chest:     Chest wall: No tenderness.  Lymphadenopathy:     Cervical: No cervical adenopathy.  Skin:    Coloration: Skin is not pale.     Findings: No abrasion, erythema, petechiae or rash. Rash is not papular, urticarial or vesicular.  Neurological:     Mental Status: She is alert.  Psychiatric:        Behavior: Behavior is cooperative.      Diagnostic studies:    Spirometry: results abnormal (FEV1: 1.32/63%, FVC: 2.15/81%, FEV1/FVC: 61%).    Spirometry consistent with moderate obstructive disease.   Allergy Studies: none       Salvatore Marvel, MD  [facility]

## 2022-02-23 NOTE — Patient Instructions (Addendum)
1. Mild persistent asthma, uncomplicated - Lung testing still looks stable. - We are not going to make any changes at this point in time. - You seem to have a good sense of your symptoms.  - Daily controller medication(s): Breo 200/63mg one puff once daily - Prior to physical activity: albuterol 2 puffs 10-15 minutes before physical activity. - Rescue medications: albuterol 4 puffs every 4-6 hours as needed - Asthma control goals:  * Full participation in all desired activities (may need albuterol before activity) * Albuterol use two time or less a week on average (not counting use with activity) * Cough interfering with sleep two time or less a month * Oral steroids no more than once a year * No hospitalizations  2. Chronic rhinitis - Continue with fluticasone 1-2 sprays per nostril daily.   3. Anaphylactic shock due to food (crab, [employer]	[place] - Continue to avoid these foods. - EpiPen sent in out of an abundance of caution.  4. Return in about 6 months (around 08/24/2022).    Please inform u[place]of any Emergency Department visits, hospitalizations, or changes in symptoms. Call u[place]efore going to the ED for breathing or allergy symptoms since we might be able to fit you in for a sick visit. Feel free to contact u[place]oreaanytime with any questions, problems, or concerns.  It was a pleasure to see you again today!  Websites that have reliable patient information: 1. American Academy of Asthma, Allergy, and Immunology: www.aaaai.org 2. Food Allergy Research and Education (FARE): foodallergy.org 3. Mothers of Asthmatics: http://www.asthmacommunitynetwork.org 4. American College of Allergy, Asthma, and Immunology: www.acaai.org   COVID-19 Vaccine Information can be found at: [email or web address] questions related to vaccine distribution or appointments, please email [email or web address] or call [phone]   We realize that you  might be concerned about having an allergic reaction to the COVID19 vaccines. To help with that concern, WE ARE OFFERING THE COVID19 VACCINES IN OUR OFFICE! Ask the front desk for dates!     "Like" u[place]on Facebook and Instagram for our latest updates!      A healthy democracy works best when ANew York Life Insuranceparticipate! Make sure you are registered to vote! If you have moved or changed any of your contact information, you will need to get this updated before voting!  In some cases, you MAY be able to register to vote online: [email or web address]

## 2022-02-24 MED ORDER — BREO ELLIPTA 100-25 MCG/ACT IN AEPB
1.0000 | INHALATION_SPRAY | Freq: Every day | RESPIRATORY_TRACT | 5 refills | Status: AC
Start: 2022-02-24 — End: 2022-03-26

## 2022-03-07 DIAGNOSIS — E1165 Type 2 diabetes mellitus with hyperglycemia: Secondary | ICD-10-CM | POA: Diagnosis not present

## 2022-03-07 DIAGNOSIS — E785 Hyperlipidemia, unspecified: Secondary | ICD-10-CM | POA: Diagnosis not present

## 2022-03-14 DIAGNOSIS — I10 Essential (primary) hypertension: Secondary | ICD-10-CM | POA: Diagnosis not present

## 2022-03-14 DIAGNOSIS — E785 Hyperlipidemia, unspecified: Secondary | ICD-10-CM | POA: Diagnosis not present

## 2022-03-14 DIAGNOSIS — J4540 Moderate persistent asthma, uncomplicated: Secondary | ICD-10-CM | POA: Diagnosis not present

## 2022-03-14 DIAGNOSIS — J302 Other seasonal allergic rhinitis: Secondary | ICD-10-CM | POA: Diagnosis not present

## 2022-07-11 DIAGNOSIS — E785 Hyperlipidemia, unspecified: Secondary | ICD-10-CM | POA: Diagnosis not present

## 2022-07-11 DIAGNOSIS — E1169 Type 2 diabetes mellitus with other specified complication: Secondary | ICD-10-CM | POA: Diagnosis not present

## 2022-07-15 DIAGNOSIS — J302 Other seasonal allergic rhinitis: Secondary | ICD-10-CM | POA: Diagnosis not present

## 2022-07-15 DIAGNOSIS — E1169 Type 2 diabetes mellitus with other specified complication: Secondary | ICD-10-CM | POA: Diagnosis not present

## 2022-07-15 DIAGNOSIS — I10 Essential (primary) hypertension: Secondary | ICD-10-CM | POA: Diagnosis not present

## 2022-07-15 DIAGNOSIS — E785 Hyperlipidemia, unspecified: Secondary | ICD-10-CM | POA: Diagnosis not present

## 2022-07-15 DIAGNOSIS — J4540 Moderate persistent asthma, uncomplicated: Secondary | ICD-10-CM | POA: Diagnosis not present

## 2022-08-24 ENCOUNTER — Ambulatory Visit: Payer: BC Managed Care – PPO | Admitting: Allergy & Immunology

## 2022-08-24 VITALS — BP 148/70 | HR 89 | Temp 98.1°F | Resp 18 | Ht 60.5 in | Wt 145.2 lb

## 2022-08-24 DIAGNOSIS — J310 Chronic rhinitis: Secondary | ICD-10-CM

## 2022-08-24 DIAGNOSIS — T7800XD Anaphylactic reaction due to unspecified food, subsequent encounter: Secondary | ICD-10-CM

## 2022-08-24 DIAGNOSIS — J4540 Moderate persistent asthma, uncomplicated: Secondary | ICD-10-CM | POA: Diagnosis not present

## 2022-08-24 MED ORDER — ALBUTEROL SULFATE HFA 108 (90 BASE) MCG/ACT IN AERS: 2.0000 | INHALATION_SPRAY | RESPIRATORY_TRACT | 1 refills | Status: DC | PRN

## 2022-08-24 MED ORDER — EPINEPHRINE 0.3 MG/0.3ML IJ SOAJ: 0.3000 mg | INTRAMUSCULAR | 1 refills | Status: AC | PRN

## 2022-08-24 MED ORDER — BREO ELLIPTA 100-25 MCG/ACT IN AEPB: 1.0000 | INHALATION_SPRAY | Freq: Every day | RESPIRATORY_TRACT | 3 refills | Status: DC

## 2022-08-24 NOTE — Progress Notes (Signed)
FOLLOW UP  Date of Service/Encounter:  08/24/22   Assessment:   Moderate persistent asthma, uncomplicated    Persistent obstructive lung defect - stabilized, but considering full PFTs in the future   Chronic rhinitis   Anaphylactic shock due to food (crab, [employer])   Fully vaccinated to [employer])   Plan/Recommendations:   1. Mild persistent asthma, uncomplicated - Lung testing still looks stable. - We are not going to make any changes at this point in time. - You seem to have a good sense of your symptoms.  - Daily controller medication(s): Breo 200/49mcg one puff once daily - Prior to physical activity: albuterol 2 puffs 10-15 minutes before physical activity. - Rescue medications: albuterol 4 puffs every 4-6 hours as needed - Asthma control goals:  * Full participation in all desired activities (may need albuterol before activity) * Albuterol use two time or less a week on average (not counting use with activity) * Cough interfering with sleep two time or less a month * Oral steroids no more than once a year * No hospitalizations  2. Chronic rhinitis - Continue with fluticasone 1-2 sprays per nostril daily.   3. Anaphylactic shock due to food (crab, [employer]) - Continue to avoid these foods. - EpiPen sent in out of an abundance of caution. - Do not fill if this is too expensive.   4. Return in about 1 year (around 08/24/2023).    Subjective:   Susan Christensen is a 67 y.o. female presenting today for follow up of No chief complaint on file.   Susan Christensen has a history of the following: Patient Active Problem List   Diagnosis Date Noted   Elevated glucose 06/27/2019   Uncomplicated asthma 06/27/2019   Hemorrhoids 06/27/2019   Well adult exam 04/03/2019   Glucose intolerance 05/09/2018   Malignant neoplasm of lower-inner quadrant of right breast of female, estrogen receptor positive ([facility]) 06/20/2017   Fatty liver 08/10/2016    Hyperlipidemia 01/20/2009   OTHER CHRONIC ALLERGIC CONJUNCTIVITIS 01/20/2009   Essential hypertension 01/20/2009   GESTATIONAL DIABETES 01/20/2009   ROSACEA 01/20/2009   LIVER FUNCTION TESTS, ABNORMAL, HX OF 01/20/2009   ABNORMAL TRANSAMINASE, (LFT'S) 07/04/2008   SHELLFISH ALLERGY 07/04/2008    History obtained from: chart review and patient.  Susan Christensen is a 67 y.o. female presenting for a follow up visit.  She was last seen in November 2023.  At that time, her lung testing looks stable.  We continue with Breo 200 mcg 1 puff once daily as well as albuterol as needed.  For her rhinitis, continue with Flonase.  She continues to avoid crab and [employer].  Her EpiPen was sent in.  Since last visit, she has done well.   Asthma/Respiratory Symptom History: She has not been on albuterol too often.  She remains on the Breo one puff once daily. Susan Christensen is not expensive at all. Susan Christensen's asthma has been well controlled. She has not required rescue medication, experienced nocturnal awakenings due to lower respiratory symptoms, nor have activities of daily living been limited. She has required no Emergency Department or Urgent Care visits for her asthma. She has required zero courses of systemic steroids for asthma exacerbations since the last visit. ACT score today is 25, indicating excellent asthma symptom control. She is sleeping mostly at night. She has some issues where she has some problems with getting warm. She thinks that this is age.   She did have an echocardiogram for evaluation of  a heart murmur. Everything was normal. She had some mild mitral valve regurgitation and mile aortic valve stenosis. She was asymptomatic.   Allergic Rhinitis Symptom History: She remains on the Flonase and the Systane eye drops.  The pollen has been high and she has been feeling it. This is nothing excessive. She denies sinus infections. Hse has been doing very well.   She is done with tamoxifen (completed five  years). She was on this for her history of breast cancer.   Her father was a heavy smoker during her childhood. She has never been a smoker herself. Currently she is the Diplomatic Services operational officer at [employer]. She was [employer] and [place] (a store). This is a part time gig and she likes getting out of the house. She is not sure if she is going to full retire, but she does not see this happening.   Otherwise, there have been no changes to her past medical history, surgical history, family history, or social history.    Review of Systems  Constitutional: Negative.  Negative for chills, fever, malaise/fatigue and weight loss.  HENT: Negative.  Negative for congestion, ear discharge, ear pain and sinus pain.   Eyes:  Negative for pain, discharge and redness.  Respiratory:  Negative for cough, sputum production, shortness of breath and wheezing.   Cardiovascular: Negative.  Negative for chest pain and palpitations.  Gastrointestinal:  Negative for abdominal pain, constipation, diarrhea, heartburn, nausea and vomiting.  Skin: Negative.  Negative for itching and rash.  Neurological:  Negative for dizziness and headaches.  Endo/Heme/Allergies:  Negative for environmental allergies. Does not bruise/bleed easily.       Objective:   There were no vitals taken for this visit. There is no height or weight on file to calculate BMI.    Physical Exam Vitals reviewed.  Constitutional:      Appearance: She is well-developed.     Comments: Very lovely as always.  HENT:     Head: Normocephalic and atraumatic.     Right Ear: Tympanic membrane, ear canal and external ear normal.     Left Ear: Tympanic membrane, ear canal and external ear normal.     Nose: No nasal deformity, septal deviation, mucosal edema or rhinorrhea.     Right Turbinates: Enlarged, swollen and pale.     Left Turbinates: Enlarged, swollen and pale.     Right Sinus: No maxillary sinus tenderness or frontal sinus tenderness.     Left  Sinus: No maxillary sinus tenderness or frontal sinus tenderness.     Comments: No nasal polyps noted.     Mouth/Throat:     Lips: Pink.     Mouth: Mucous membranes are moist. Mucous membranes are not pale and not dry.     Pharynx: Uvula midline.     Comments: Cobblestoning present in the posterior oropharynx.  Eyes:     General: Lids are normal. Allergic shiner present.        Right eye: No discharge.        Left eye: No discharge.     Conjunctiva/sclera: Conjunctivae normal.     Right eye: Right conjunctiva is not injected. No chemosis.    Left eye: Left conjunctiva is not injected. No chemosis.    Pupils: Pupils are equal, round, and reactive to light.  Cardiovascular:     Rate and Rhythm: Normal rate and regular rhythm.     Heart sounds: Normal heart sounds.  Pulmonary:     Effort: Pulmonary effort  is normal. No tachypnea, accessory muscle usage or respiratory distress.     Breath sounds: Normal breath sounds. No wheezing, rhonchi or rales.     Comments: Moving air well in all lung fields. No increased work of breathing noted.  Chest:     Chest wall: No tenderness.  Lymphadenopathy:     Cervical: No cervical adenopathy.  Skin:    Coloration: Skin is not pale.     Findings: No abrasion, erythema, petechiae or rash. Rash is not papular, urticarial or vesicular.  Neurological:     Mental Status: She is alert.  Psychiatric:        Behavior: Behavior is cooperative.      Diagnostic studies: none       Malachi Bonds, MD  [facility]

## 2022-08-24 NOTE — Patient Instructions (Addendum)
1. Mild persistent asthma, uncomplicated - Lung testing still looks stable. - We are not going to make any changes at this point in time. - You seem to have a good sense of your symptoms.  - Daily controller medication(s): Breo 200/15mcg one puff once daily - Prior to physical activity: albuterol 2 puffs 10-15 minutes before physical activity. - Rescue medications: albuterol 4 puffs every 4-6 hours as needed - Asthma control goals:  * Full participation in all desired activities (may need albuterol before activity) * Albuterol use two time or less a week on average (not counting use with activity) * Cough interfering with sleep two time or less a month * Oral steroids no more than once a year * No hospitalizations  2. Chronic rhinitis - Continue with fluticasone 1-2 sprays per nostril daily.   3. Anaphylactic shock due to food (crab, [employer]) - Continue to avoid these foods. - EpiPen sent in out of an abundance of caution. - Do not fill if this is too expensive.   4. Return in about 1 year (around 08/24/2023).    Please inform us of any Emergency Department visits, hospitalizations, or changes in symptoms. Call us before going to the ED for breathing or allergy symptoms since we might be able to fit you in for a sick visit. Feel free to contact us anytime with any questions, problems, or concerns.  It was a pleasure to see you again today!  Websites that have reliable patient information: 1. American Academy of Asthma, Allergy, and Immunology: www.aaaai.org 2. Food Allergy Research and Education (FARE): foodallergy.org 3. Mothers of Asthmatics: http://www.asthmacommunitynetwork.org 4. American College of Allergy, Asthma, and Immunology: www.acaai.org   COVID-19 Vaccine Information can be found at: [email or web address] For questions related to vaccine distribution or appointments, please email [email or web address] or call  [phone].   We realize that you might be concerned about having an allergic reaction to the COVID19 vaccines. To help with that concern, WE ARE OFFERING THE COVID19 VACCINES IN OUR OFFICE! Ask the front desk for dates!     "Like" [place] on Facebook and Instagram for our latest updates!      A healthy democracy works best when [employer] participate! Make sure you are registered to vote! If you have moved or changed any of your contact information, you will need to get this updated before voting!  In some cases, you MAY be able to register to vote online: [email or web address]

## 2022-08-29 ENCOUNTER — Encounter: Payer: Self-pay | Admitting: Allergy & Immunology

## 2022-09-20 ENCOUNTER — Other Ambulatory Visit (HOSPITAL_COMMUNITY): Payer: Self-pay | Admitting: Internal Medicine

## 2022-09-20 DIAGNOSIS — Z1231 Encounter for screening mammogram for malignant neoplasm of breast: Secondary | ICD-10-CM

## 2022-09-22 ENCOUNTER — Other Ambulatory Visit: Payer: Self-pay | Admitting: Allergy & Immunology

## 2022-09-26 ENCOUNTER — Ambulatory Visit (HOSPITAL_COMMUNITY)
Admission: RE | Admit: 2022-09-26 | Discharge: 2022-09-26 | Disposition: A | Discharge status: Home / Self Care | Payer: BC Managed Care – PPO | Source: Ambulatory Visit | Attending: Internal Medicine | Admitting: Internal Medicine

## 2022-09-26 ENCOUNTER — Encounter (HOSPITAL_COMMUNITY): Payer: Self-pay

## 2022-09-26 DIAGNOSIS — Z1231 Encounter for screening mammogram for malignant neoplasm of breast: Secondary | ICD-10-CM | POA: Diagnosis not present

## 2022-09-28 ENCOUNTER — Other Ambulatory Visit (HOSPITAL_COMMUNITY): Payer: Self-pay | Admitting: Internal Medicine

## 2022-09-28 DIAGNOSIS — R928 Other abnormal and inconclusive findings on diagnostic imaging of breast: Secondary | ICD-10-CM

## 2022-10-06 ENCOUNTER — Ambulatory Visit (HOSPITAL_COMMUNITY)
Admission: RE | Admit: 2022-10-06 | Discharge: 2022-10-06 | Disposition: A | Discharge status: Home / Self Care | Payer: BC Managed Care – PPO | Source: Ambulatory Visit | Attending: Internal Medicine | Admitting: Internal Medicine

## 2022-10-06 ENCOUNTER — Encounter (HOSPITAL_COMMUNITY): Payer: Self-pay

## 2022-10-06 DIAGNOSIS — R928 Other abnormal and inconclusive findings on diagnostic imaging of breast: Secondary | ICD-10-CM

## 2022-10-06 DIAGNOSIS — R92322 Mammographic fibroglandular density, left breast: Secondary | ICD-10-CM | POA: Diagnosis not present

## 2022-10-06 DIAGNOSIS — Z853 Personal history of malignant neoplasm of breast: Secondary | ICD-10-CM | POA: Diagnosis not present

## 2022-11-15 ENCOUNTER — Other Ambulatory Visit: Payer: Self-pay | Admitting: Allergy & Immunology

## 2022-11-24 IMAGING — MG MM DIGITAL SCREENING BILAT W/ TOMO AND CAD
6 of 10 series · 6 of 30 positions shown · non-contrast
Comparison: Previous exam(s).

CLINICAL DATA: Screening.

EXAM:
DIGITAL SCREENING BILATERAL MAMMOGRAM WITH TOMOSYNTHESIS AND CAD
TECHNIQUE: Bilateral screening digital craniocaudal and mediolateral oblique
mammograms were obtained. Bilateral screening digital breast
tomosynthesis was performed. The images were evaluated with
computer-aided detection.

[R CC synth-2D]
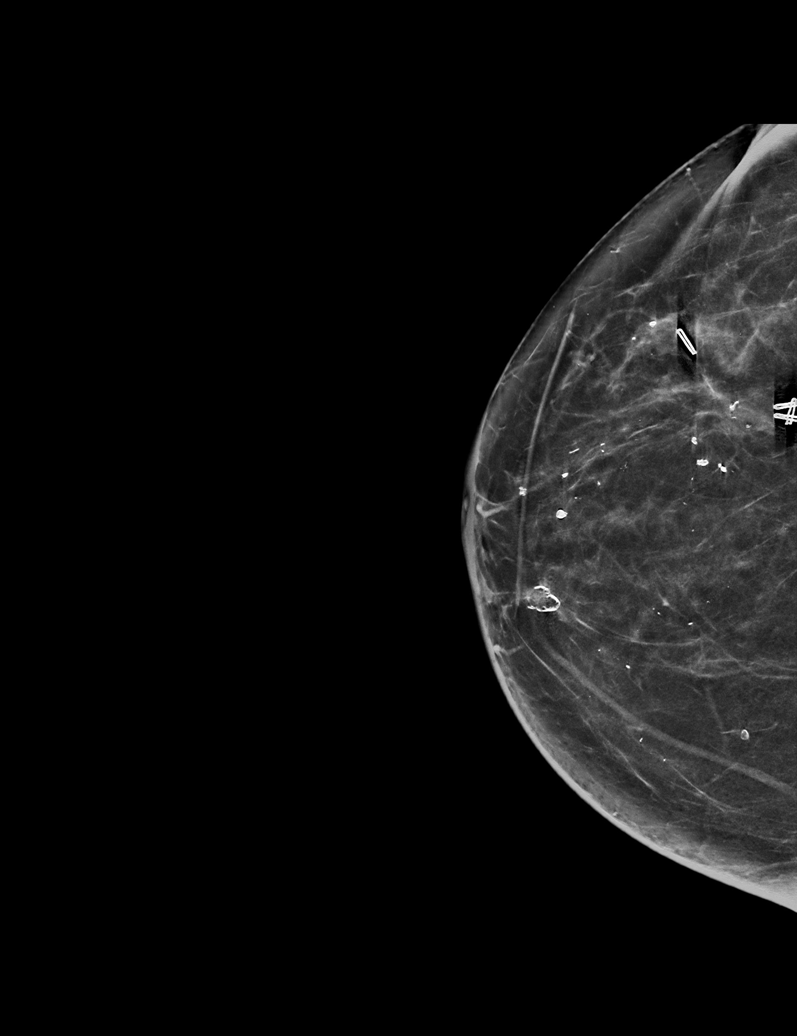

[R MLO synth-2D (1 of 2)]
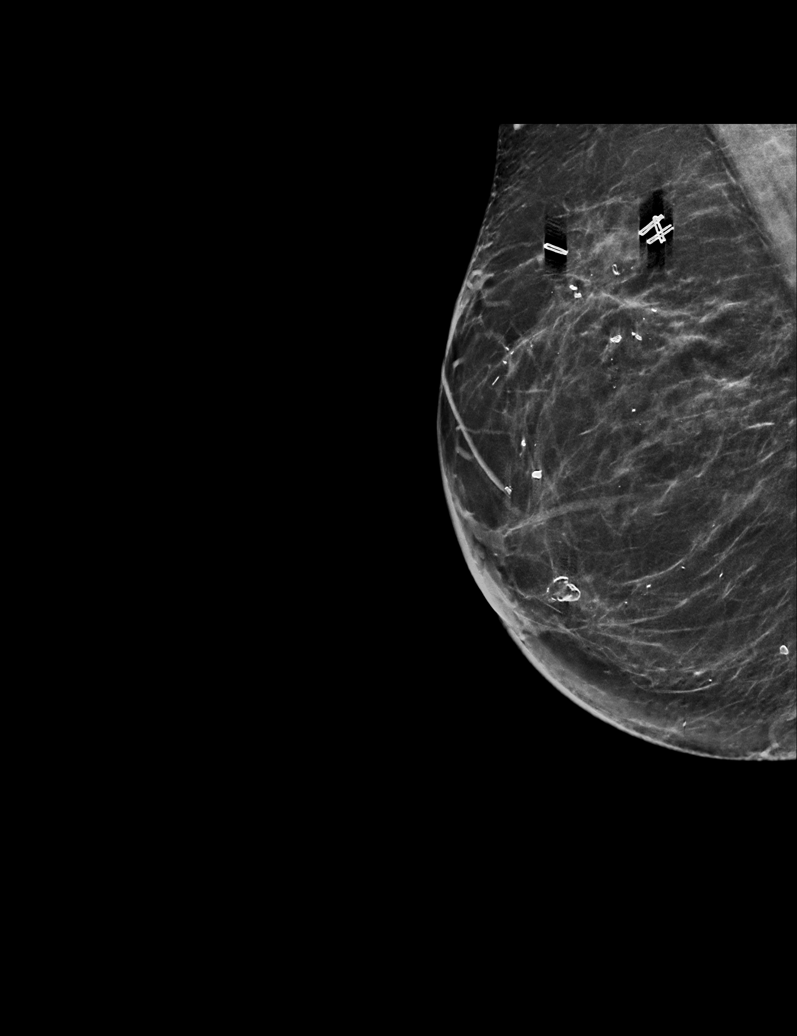

[L MLO synth-2D]
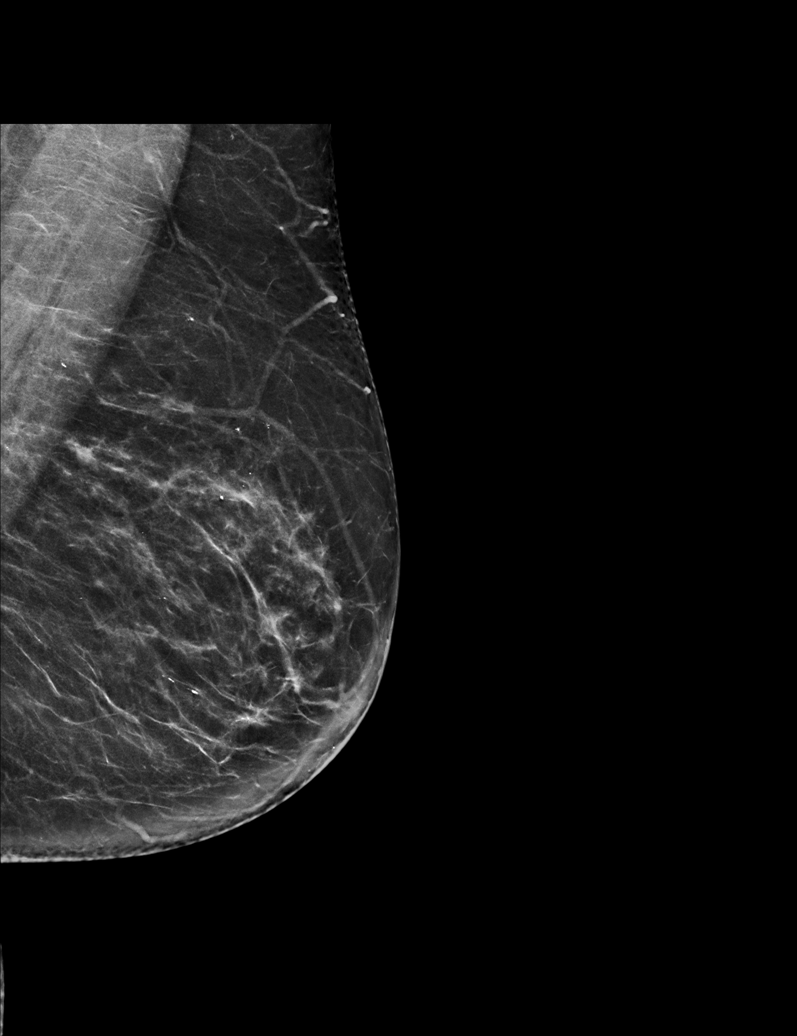

[R MLO synth-2D (2 of 2)]
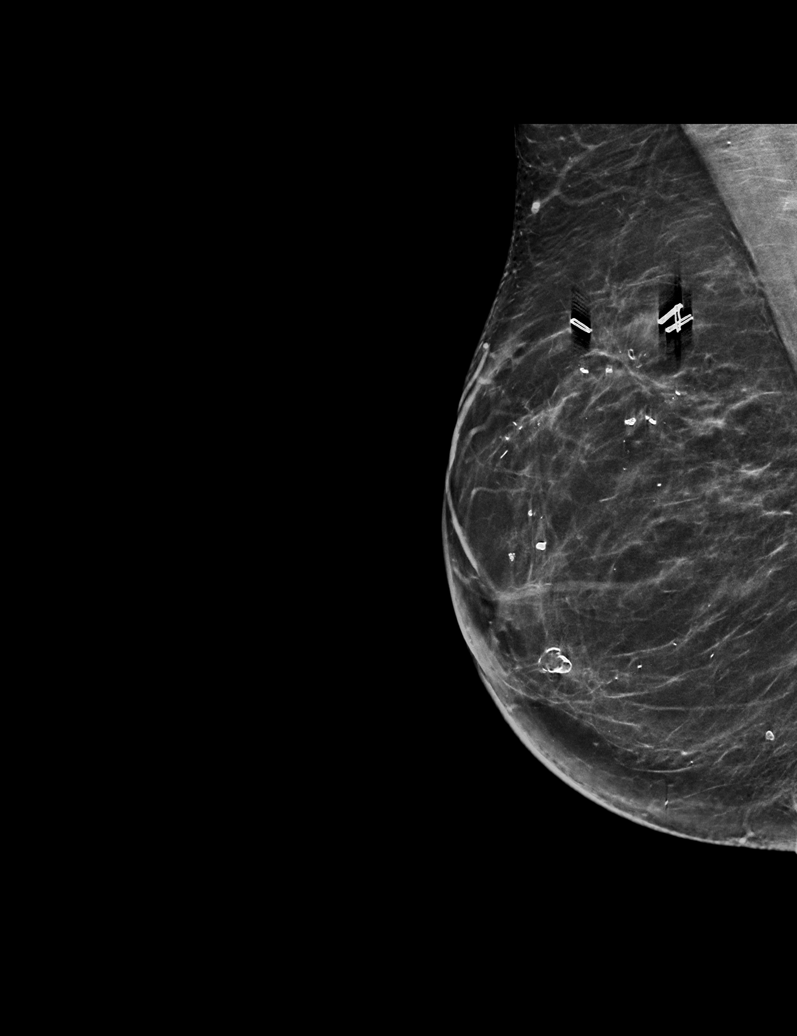

[L CC synth-2D]
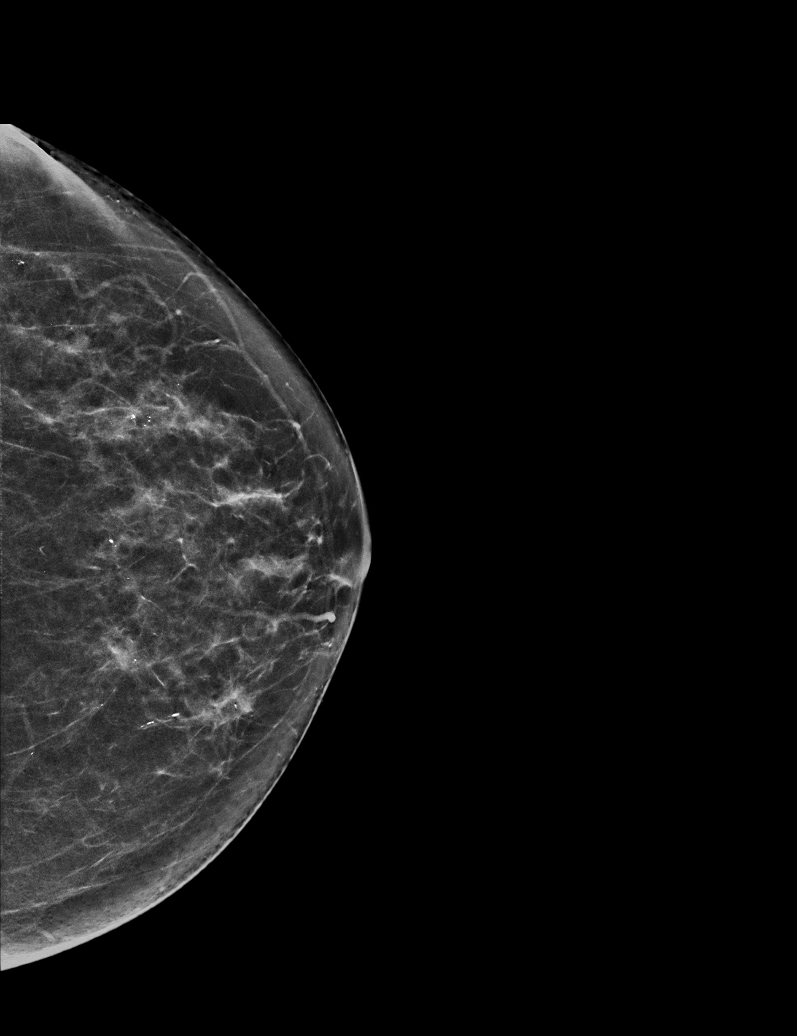

[R CC tomo · tomo slice 33/66.0]
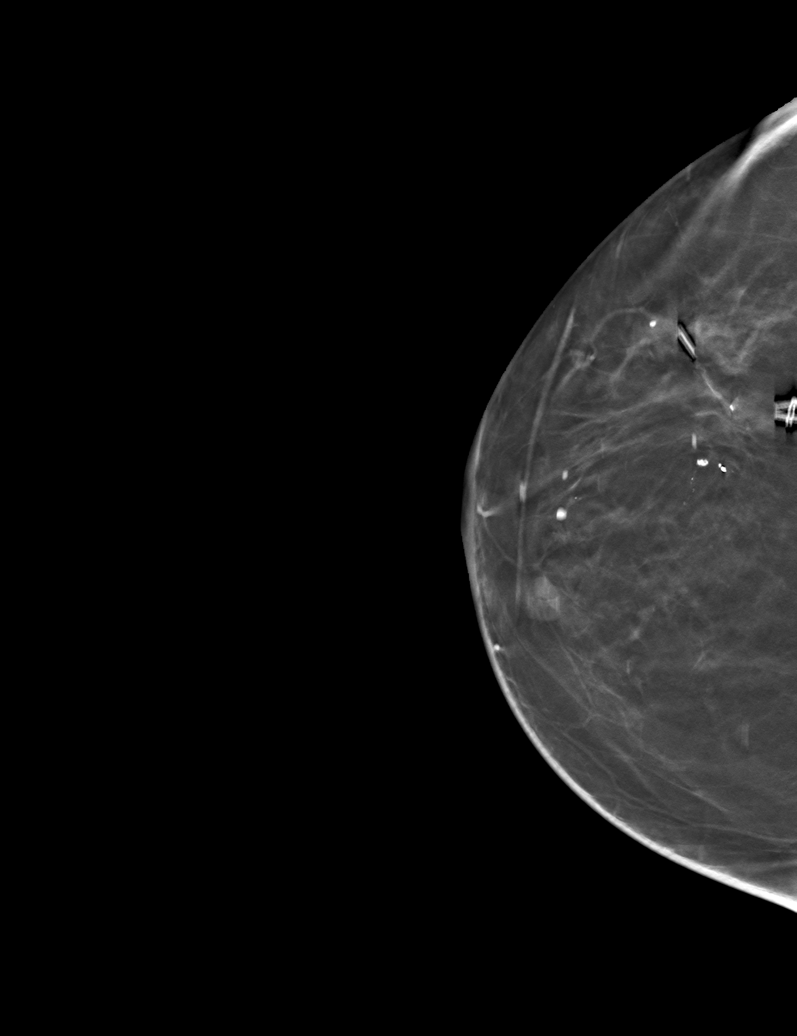

[6 of 30 positions shown; findings below may reference images not displayed]

ACR Breast Density Category b: There are scattered areas of
fibroglandular density.
FINDINGS: There are no findings suspicious for malignancy.
IMPRESSION: No mammographic evidence of malignancy. A result letter of this
screening mammogram will be mailed directly to the patient.

RECOMMENDATION:
Screening mammogram in one year. (Code:51-O-LD2)

BI-RADS CATEGORY  1: Negative.

## 2023-01-10 DIAGNOSIS — E785 Hyperlipidemia, unspecified: Secondary | ICD-10-CM | POA: Diagnosis not present

## 2023-01-10 DIAGNOSIS — E1169 Type 2 diabetes mellitus with other specified complication: Secondary | ICD-10-CM | POA: Diagnosis not present

## 2023-01-17 ENCOUNTER — Other Ambulatory Visit (HOSPITAL_COMMUNITY): Payer: Self-pay | Admitting: Internal Medicine

## 2023-01-17 DIAGNOSIS — Z0001 Encounter for general adult medical examination with abnormal findings: Secondary | ICD-10-CM | POA: Diagnosis not present

## 2023-01-17 DIAGNOSIS — I10 Essential (primary) hypertension: Secondary | ICD-10-CM | POA: Diagnosis not present

## 2023-01-17 DIAGNOSIS — E785 Hyperlipidemia, unspecified: Secondary | ICD-10-CM | POA: Diagnosis not present

## 2023-01-17 DIAGNOSIS — H5789 Other specified disorders of eye and adnexa: Secondary | ICD-10-CM | POA: Diagnosis not present

## 2023-01-17 DIAGNOSIS — R945 Abnormal results of liver function studies: Secondary | ICD-10-CM

## 2023-01-17 DIAGNOSIS — Z23 Encounter for immunization: Secondary | ICD-10-CM | POA: Diagnosis not present

## 2023-01-17 DIAGNOSIS — J302 Other seasonal allergic rhinitis: Secondary | ICD-10-CM | POA: Diagnosis not present

## 2023-01-17 DIAGNOSIS — J4540 Moderate persistent asthma, uncomplicated: Secondary | ICD-10-CM | POA: Diagnosis not present

## 2023-01-18 ENCOUNTER — Other Ambulatory Visit (HOSPITAL_COMMUNITY): Payer: Self-pay | Admitting: Internal Medicine

## 2023-01-18 DIAGNOSIS — M8580 Other specified disorders of bone density and structure, unspecified site: Secondary | ICD-10-CM

## 2023-01-20 ENCOUNTER — Other Ambulatory Visit: Payer: Self-pay | Admitting: Allergy & Immunology

## 2023-01-27 ENCOUNTER — Ambulatory Visit (HOSPITAL_COMMUNITY)
Admission: RE | Admit: 2023-01-27 | Discharge: 2023-01-27 | Disposition: A | Discharge status: Home / Self Care | Payer: BC Managed Care – PPO | Source: Ambulatory Visit | Attending: Internal Medicine | Admitting: Internal Medicine

## 2023-01-27 DIAGNOSIS — M8589 Other specified disorders of bone density and structure, multiple sites: Secondary | ICD-10-CM | POA: Diagnosis not present

## 2023-01-27 DIAGNOSIS — M8580 Other specified disorders of bone density and structure, unspecified site: Secondary | ICD-10-CM | POA: Diagnosis not present

## 2023-01-27 DIAGNOSIS — Z780 Asymptomatic menopausal state: Secondary | ICD-10-CM | POA: Diagnosis not present

## 2023-02-01 ENCOUNTER — Ambulatory Visit (HOSPITAL_COMMUNITY)
Admission: RE | Admit: 2023-02-01 | Discharge: 2023-02-01 | Disposition: A | Discharge status: Home / Self Care | Payer: BC Managed Care – PPO | Source: Ambulatory Visit | Attending: Internal Medicine | Admitting: Internal Medicine

## 2023-02-01 DIAGNOSIS — R945 Abnormal results of liver function studies: Secondary | ICD-10-CM | POA: Insufficient documentation

## 2023-02-01 DIAGNOSIS — R7989 Other specified abnormal findings of blood chemistry: Secondary | ICD-10-CM | POA: Diagnosis not present

## 2023-02-01 DIAGNOSIS — R932 Abnormal findings on diagnostic imaging of liver and biliary tract: Secondary | ICD-10-CM | POA: Diagnosis not present

## 2023-03-07 ENCOUNTER — Other Ambulatory Visit (HOSPITAL_COMMUNITY): Payer: Self-pay | Admitting: Internal Medicine

## 2023-03-07 DIAGNOSIS — N6489 Other specified disorders of breast: Secondary | ICD-10-CM

## 2023-03-18 ENCOUNTER — Other Ambulatory Visit: Payer: Self-pay | Admitting: Allergy & Immunology

## 2023-04-18 ENCOUNTER — Ambulatory Visit (HOSPITAL_COMMUNITY)
Admission: RE | Admit: 2023-04-18 | Discharge: 2023-04-18 | Disposition: A | Discharge status: Home / Self Care | Payer: Medicare Other | Source: Ambulatory Visit | Attending: Internal Medicine | Admitting: Internal Medicine

## 2023-04-18 ENCOUNTER — Encounter (HOSPITAL_COMMUNITY): Payer: Self-pay

## 2023-04-18 DIAGNOSIS — N6489 Other specified disorders of breast: Secondary | ICD-10-CM

## 2023-04-19 ENCOUNTER — Other Ambulatory Visit (HOSPITAL_COMMUNITY): Payer: Self-pay | Admitting: Internal Medicine

## 2023-04-19 DIAGNOSIS — R928 Other abnormal and inconclusive findings on diagnostic imaging of breast: Secondary | ICD-10-CM

## 2023-05-01 ENCOUNTER — Other Ambulatory Visit (HOSPITAL_COMMUNITY): Payer: Self-pay | Admitting: Internal Medicine

## 2023-05-01 DIAGNOSIS — R928 Other abnormal and inconclusive findings on diagnostic imaging of breast: Secondary | ICD-10-CM

## 2023-05-02 ENCOUNTER — Ambulatory Visit (HOSPITAL_COMMUNITY)
Admission: RE | Admit: 2023-05-02 | Discharge: 2023-05-02 | Disposition: A | Discharge status: Home / Self Care | Payer: Medicare Other | Source: Ambulatory Visit | Attending: Internal Medicine | Admitting: Internal Medicine

## 2023-05-02 ENCOUNTER — Encounter (HOSPITAL_COMMUNITY): Payer: Self-pay

## 2023-05-02 ENCOUNTER — Ambulatory Visit (HOSPITAL_COMMUNITY)
Admission: RE | Admit: 2023-05-02 | Discharge: 2023-05-02 | Discharge status: Home / Self Care | Payer: Medicare Other | Source: Ambulatory Visit | Attending: Internal Medicine | Admitting: Internal Medicine

## 2023-05-02 DIAGNOSIS — R928 Other abnormal and inconclusive findings on diagnostic imaging of breast: Secondary | ICD-10-CM | POA: Diagnosis present

## 2023-05-02 HISTORY — PX: BREAST BIOPSY: SHX20

## 2023-05-02 MED ORDER — LIDOCAINE-EPINEPHRINE (PF) 1 %-1:200000 IJ SOLN
30.0000 mL | Freq: Once | INTRAMUSCULAR | Status: AC
  Administered 2023-05-02: 10 mL via INTRADERMAL

## 2023-05-02 MED ORDER — LIDOCAINE HCL (PF) 2 % IJ SOLN
INTRAMUSCULAR | Status: AC
  Filled 2023-05-02: qty 10

## 2023-05-02 MED ORDER — LIDOCAINE HCL (PF) 2 % IJ SOLN
10.0000 mL | Freq: Once | INTRAMUSCULAR | Status: AC
  Administered 2023-05-02: 10 mL via INTRADERMAL

## 2023-05-02 NOTE — Progress Notes (Signed)
Pt brought to [place] 2 in no obvious distress. Left breast biopsy explained, consent obtained. Prepped and draped in sterile manner. Local anesthetic admin without adverse reaction. Access obtained, samples procured. Access withdrawn, pressure applied, bandage applied. DC instructions explained, escorted to rad waiting for DC.

## 2023-05-04 LAB — SURGICAL PATHOLOGY

## 2023-06-05 ENCOUNTER — Ambulatory Visit: Payer: Self-pay | Admitting: Surgery

## 2023-06-05 DIAGNOSIS — C50912 Malignant neoplasm of unspecified site of left female breast: Secondary | ICD-10-CM

## 2023-06-05 NOTE — Progress Notes (Signed)
 Location of Breast Cancer: Left Breast Cancer, estrogen receptor positive  Histology per Pathology Report:    Receptor Status: ER(Positive), PR (***), Her2-neu (***), Ki-67(***)  Did patient present with symptoms (if so, please note symptoms) or was this found on screening mammography?: ***  Past/Anticipated interventions by surgeon, if any:{t:21944} ***  Past/Anticipated interventions by medical oncology, if any: Chemotherapy ***  Lymphedema issues, if any:  {:18581} {t:21944}   Pain issues, if any:  {:18581} {PAIN DESCRIPTION:21022940}  SAFETY ISSUES: Prior radiation? {:18581} Pacemaker/ICD? {:18581} Possible current pregnancy?{:18581} Is the patient on methotrexate? {:18581}  Current Complaints / other details:  ***

## 2023-06-06 ENCOUNTER — Other Ambulatory Visit: Payer: Self-pay | Admitting: Surgery

## 2023-06-06 DIAGNOSIS — C50912 Malignant neoplasm of unspecified site of left female breast: Secondary | ICD-10-CM

## 2023-06-06 NOTE — Progress Notes (Signed)
 Radiation Oncology         [phone] ________________________________  Initial outpatient Consultation  Name: Susan Christensen MRN: [id number]  Date: 06/07/2023  DOB: 06/14/1955  NU:UVOZ, Kathleene Hazel, MD  Harriette Bouillon, MD   REFERRING PHYSICIAN: Harriette Bouillon, MD  DIAGNOSIS: No diagnosis found.   Cancer Staging  No matching staging information was found for the patient.   Malignant neoplasm of lower-inner quadrant of right breast of female, estrogen receptor positive (HCC)  ER+ / PR+ / Her2 negative, Grade 2  CHIEF COMPLAINT: Here to discuss management of left breast cancer  HISTORY OF PRESENT ILLNESS::Susan Christensen is a 68 y.o. female with a history of right breast cancer from 2020 treated with breast conserving surgery and radiation therapy followed by antiestrogen therapy. Last seen in office on 09-12-17 for a routine follow up.    Patient presented with breast abnormality on a routine unilateral diagnostic mammogram on the date of 04-18-23. No symptoms were indicated at that time. Ultrasound of breast on 04-18-23 revealed an oval hypoechoic mass in the left breast at 2 o'clock 5 cm from nipple measuring 0.6 x 0.3 x 0.6 cm at the greatest extent with calcifications. Scan did not indicate any disease or abnormalities with the lymph nodes in the left axilla.   Subsequently, she underwent a fine needle core biopsy of the left breast on 05-02-23 under the care of Dr. Luisa Hart which showed grade 2 invasive mammary carcinoma with extracellular mucin with calcifications present. Tubule formation: Score 3, Nuclear pleomorphism: Score 2 ER status: 60%, positive, with moderate staining intensity ; PR status 30%, positive, moderate staining intensity , Her2 status negative 1+; Grade 2. Ki67: 2%   Upon discussion with Dr. Luisa Hart during her most recent follow up on 06-05-23, she decided to proceed with breast conserving surgery with lumpectomy and reconstruction which is scheduled for March 12th.  No sentinel lymph node mapping will be done at that time due to patient age and other limitations.    PREVIOUS RADIATION THERAPY: Yes  07/17/17 - 08/11/17 Site/dose: 1) 40.05 Gy directed to the right breast in 15 fractions 2) followed by boost of 10 Gy in 5 fractions. Beams/energy:   1) 3D / 6X Photons 2) Electrons / 9 and 12 MeV   PAST MEDICAL HISTORY:  has a past medical history of Allergy, Asthma, Cancer (HCC), Environmental and seasonal allergies, Fatty liver, GERD (gastroesophageal reflux disease), History of kidney stones, History of radiation therapy (07/17/17- 08/11/17), Hypertension, and Papilloma of breast.    PAST SURGICAL HISTORY: Past Surgical History:  Procedure Laterality Date   BREAST BIOPSY Right 05/30/2017   Procedure: RIGHT NIPPLE BIOPSY;  Surgeon: Harriette Bouillon, MD;  Location: [facility];  Service: General;  Laterality: Right;   BREAST BIOPSY Left 05/02/2023   path pending   BREAST BIOPSY Left 05/02/2023   [place] LT BREAST BX W LOC DEV 1ST LESION IMG BX SPEC US GUIDE 05/02/2023 AP-ULTRASOUND   BREAST LUMPECTOMY WITH RADIOACTIVE SEED LOCALIZATION Right 05/30/2017   Procedure: RIGHT BREAST LUMPECTOMY WITH RADIOACTIVE SEED LOCALIZATION x2;  Surgeon: Harriette Bouillon, MD;  Location: [facility];  Service: General;  Laterality: Right;   BREAST LUMPECTOMY WITH SENTINEL LYMPH NODE BIOPSY Right 06/14/2017   Procedure: RE-EXCISION OF RIGHT BREAST LUMPECTOMY WITH RIGHT SENTINEL LYMPH NODE BX;  Surgeon: Harriette Bouillon, MD;  Location: [facility];  Service: General;  Laterality: Right;   CESAREAN SECTION     COLONOSCOPY  2009   Dr. Darrick Penna: normal.  Screening in 10 years   CYST REMOVAL HAND Left     FAMILY HISTORY: family history includes Heart attack in her father; Heart disease in her father and mother; Hypertension in her father; Stroke in her mother.  SOCIAL HISTORY:  reports that she has never smoked. She has never used smokeless tobacco. She reports current  alcohol use. She reports that she does not use drugs.  ALLERGIES: Effexor [venlafaxine] and Shellfish allergy  MEDICATIONS:  Current Outpatient Medications  Medication Sig Dispense Refill   albuterol (VENTOLIN HFA) 108 (90 Base) MCG/ACT inhaler Inhale 2 puffs into the lungs every 4 (four) hours as needed for wheezing or shortness of breath. 18 g 1   amLODipine (NORVASC) 10 MG tablet Take 1 tablet (10 mg total) by mouth daily. 90 tablet 1   BREO ELLIPTA 100-25 MCG/ACT AEPB Inhale 1 puff into the lungs daily. 60 each 3   cholecalciferol (VITAMIN D) 1000 UNITS tablet Take 1,000 Units by mouth daily.     EPINEPHrine (EPIPEN 2-PAK) 0.3 mg/0.3 mL IJ SOAJ injection Inject 0.3 mg into the muscle as needed for anaphylaxis. 2 each 1   fluticasone (FLONASE) 50 MCG/ACT nasal spray Place 1 spray into both nostrils daily.      olmesartan (BENICAR) 40 MG tablet Take 40 mg by mouth daily.     Propylene Glycol (SYSTANE BALANCE OP) Apply 1 drop to eye every morning. Pt may use up to 4 times daily prn     No current facility-administered medications for this encounter.    REVIEW OF SYSTEMS: As above in HPI.   PHYSICAL EXAM:  vitals were not taken for this visit.   General: Alert and oriented, in no acute distress HEENT: Head is normocephalic. Extraocular movements are intact. Oropharynx is clear. Neck: Neck is supple, no palpable cervical or supraclavicular lymphadenopathy. Heart: Regular in rate and rhythm with no murmurs, rubs, or gallops. Chest: Clear to auscultation bilaterally, with no rhonchi, wheezes, or rales. Abdomen: Soft, nontender, nondistended, with no rigidity or guarding. Extremities: No cyanosis or edema. Lymphatics: see Neck Exam Skin: No concerning lesions. Musculoskeletal: symmetric strength and muscle tone throughout. Neurologic: Cranial nerves II through XII are grossly intact. No obvious focalities. Speech is fluent. Coordination is intact. Psychiatric: Judgment and insight are  intact. Affect is appropriate. Breasts: *** . No other palpable masses appreciated in the breasts or axillae *** .    ECOG = ***  0 - Asymptomatic (Fully active, able to carry on all predisease activities without restriction)  1 - Symptomatic but completely ambulatory (Restricted in physically strenuous activity but ambulatory and able to carry out work of a light or sedentary nature. For example, light housework, office work)  2 - Symptomatic, <50% in bed during the day (Ambulatory and capable of all self care but unable to carry out any work activities. Up and about more than 50% of waking hours)  3 - Symptomatic, >50% in bed, but not bedbound (Capable of only limited self-care, confined to bed or chair 50% or more of waking hours)  4 - Bedbound (Completely disabled. Cannot carry on any self-care. Totally confined to bed or chair)  5 - Death   Santiago Glad MM, Creech RH, Tormey DC, et al. [phone]). "Toxicity and response criteria of the [facility]". Am. Evlyn Clines. Oncol. 5 (6): 649-55   LABORATORY DATA:  Lab Results  Component Value Date   WBC 10.5 05/26/2020   HGB 14.6 05/26/2020   HCT 43.6 05/26/2020   MCV  93.0 05/26/2020   PLT 271 05/26/2020   CMP     Component Value Date/Time   NA 141 05/26/2020 1456   K 4.1 05/26/2020 1456   CL 105 05/26/2020 1456   CO2 25 05/26/2020 1456   GLUCOSE 96 05/26/2020 1456   BUN 11 05/26/2020 1456   CREATININE 0.85 05/26/2020 1456   CALCIUM 9.2 05/26/2020 1456   PROT 7.6 05/26/2020 1456   ALBUMIN 4.2 05/26/2020 1456   AST 22 05/26/2020 1456   ALT 24 05/26/2020 1456   ALKPHOS 73 05/26/2020 1456   BILITOT 0.6 05/26/2020 1456   GFRNONAA >60 05/26/2020 1456   GFRAA >60 05/13/2019 1456         RADIOGRAPHY: No results found.    IMPRESSION/PLAN: ***   It was a pleasure meeting the patient today. We discussed the risks, benefits, and side effects of radiotherapy. I recommend radiotherapy to the *** to reduce her risk of  locoregional recurrence by 2/3.  We discussed that radiation would take approximately *** weeks to complete and that I would give the patient a few weeks to heal following surgery before starting treatment planning. *** If chemotherapy were to be given, this would precede radiotherapy. We spoke about acute effects including skin irritation and fatigue as well as much less common late effects including internal organ injury or irritation. We spoke about the latest technology that is used to minimize the risk of late effects for patients undergoing radiotherapy to the breast or chest wall. No guarantees of treatment were given. The patient is enthusiastic about proceeding with treatment. I look forward to participating in the patient's care.  I will await her referral back to me for postoperative follow-up and eventual CT simulation/treatment planning.  On date of service, in total, I spent *** minutes on this encounter. Patient was seen in person.   __________________________________________   Lonie Peak, MD   This document serves as a record of services personally performed by Lonie Peak, MD. It was created on her behalf by Herbie Saxon, a trained medical scribe. The creation of this record is based on the scribe's personal observations and the provider's statements to them. This document has been checked and approved by the attending provider.

## 2023-06-07 ENCOUNTER — Ambulatory Visit
Admission: RE | Admit: 2023-06-07 | Discharge: 2023-06-07 | Disposition: A | Discharge status: Home / Self Care | Payer: Medicare Other | Source: Ambulatory Visit | Attending: Radiation Oncology | Admitting: Radiation Oncology

## 2023-06-07 ENCOUNTER — Encounter: Payer: Self-pay | Admitting: Radiation Oncology

## 2023-06-07 VITALS — BP 150/64 | HR 86 | Temp 97.8°F | Resp 18 | Ht 61.0 in | Wt 149.4 lb

## 2023-06-07 DIAGNOSIS — C50912 Malignant neoplasm of unspecified site of left female breast: Secondary | ICD-10-CM

## 2023-06-07 DIAGNOSIS — C50412 Malignant neoplasm of upper-outer quadrant of left female breast: Secondary | ICD-10-CM | POA: Insufficient documentation

## 2023-06-07 DIAGNOSIS — I10 Essential (primary) hypertension: Secondary | ICD-10-CM | POA: Diagnosis not present

## 2023-06-07 DIAGNOSIS — K760 Fatty (change of) liver, not elsewhere classified: Secondary | ICD-10-CM | POA: Diagnosis not present

## 2023-06-07 DIAGNOSIS — Z87442 Personal history of urinary calculi: Secondary | ICD-10-CM | POA: Diagnosis not present

## 2023-06-07 DIAGNOSIS — Z79899 Other long term (current) drug therapy: Secondary | ICD-10-CM | POA: Insufficient documentation

## 2023-06-07 DIAGNOSIS — Z923 Personal history of irradiation: Secondary | ICD-10-CM | POA: Diagnosis not present

## 2023-06-07 DIAGNOSIS — K219 Gastro-esophageal reflux disease without esophagitis: Secondary | ICD-10-CM | POA: Diagnosis not present

## 2023-06-07 DIAGNOSIS — Z7951 Long term (current) use of inhaled steroids: Secondary | ICD-10-CM | POA: Insufficient documentation

## 2023-06-07 DIAGNOSIS — J45909 Unspecified asthma, uncomplicated: Secondary | ICD-10-CM | POA: Insufficient documentation

## 2023-06-07 DIAGNOSIS — Z170 Estrogen receptor positive status [ER+]: Secondary | ICD-10-CM | POA: Insufficient documentation

## 2023-06-12 ENCOUNTER — Inpatient Hospital Stay: Payer: Medicare Other

## 2023-06-12 ENCOUNTER — Encounter: Payer: Self-pay | Admitting: Hematology

## 2023-06-12 ENCOUNTER — Inpatient Hospital Stay: Payer: Medicare Other | Attending: Hematology | Admitting: Hematology

## 2023-06-12 VITALS — BP 149/72 | HR 80 | Temp 98.0°F | Resp 18 | Ht 61.0 in | Wt 151.7 lb

## 2023-06-12 DIAGNOSIS — Z87442 Personal history of urinary calculi: Secondary | ICD-10-CM | POA: Diagnosis not present

## 2023-06-12 DIAGNOSIS — C50412 Malignant neoplasm of upper-outer quadrant of left female breast: Secondary | ICD-10-CM | POA: Insufficient documentation

## 2023-06-12 DIAGNOSIS — J45909 Unspecified asthma, uncomplicated: Secondary | ICD-10-CM | POA: Diagnosis not present

## 2023-06-12 DIAGNOSIS — K760 Fatty (change of) liver, not elsewhere classified: Secondary | ICD-10-CM | POA: Diagnosis not present

## 2023-06-12 DIAGNOSIS — Z808 Family history of malignant neoplasm of other organs or systems: Secondary | ICD-10-CM | POA: Diagnosis not present

## 2023-06-12 DIAGNOSIS — Z823 Family history of stroke: Secondary | ICD-10-CM | POA: Diagnosis not present

## 2023-06-12 DIAGNOSIS — Z8249 Family history of ischemic heart disease and other diseases of the circulatory system: Secondary | ICD-10-CM | POA: Insufficient documentation

## 2023-06-12 DIAGNOSIS — Z636 Dependent relative needing care at home: Secondary | ICD-10-CM | POA: Diagnosis not present

## 2023-06-12 DIAGNOSIS — K219 Gastro-esophageal reflux disease without esophagitis: Secondary | ICD-10-CM | POA: Insufficient documentation

## 2023-06-12 DIAGNOSIS — Z1721 Progesterone receptor positive status: Secondary | ICD-10-CM | POA: Diagnosis not present

## 2023-06-12 DIAGNOSIS — Z170 Estrogen receptor positive status [ER+]: Secondary | ICD-10-CM | POA: Insufficient documentation

## 2023-06-12 DIAGNOSIS — Z79899 Other long term (current) drug therapy: Secondary | ICD-10-CM | POA: Diagnosis not present

## 2023-06-12 DIAGNOSIS — I10 Essential (primary) hypertension: Secondary | ICD-10-CM | POA: Diagnosis not present

## 2023-06-12 DIAGNOSIS — M8580 Other specified disorders of bone density and structure, unspecified site: Secondary | ICD-10-CM | POA: Diagnosis not present

## 2023-06-12 DIAGNOSIS — Z803 Family history of malignant neoplasm of breast: Secondary | ICD-10-CM | POA: Insufficient documentation

## 2023-06-12 DIAGNOSIS — Z1732 Human epidermal growth factor receptor 2 negative status: Secondary | ICD-10-CM | POA: Diagnosis not present

## 2023-06-12 DIAGNOSIS — Z853 Personal history of malignant neoplasm of breast: Secondary | ICD-10-CM

## 2023-06-12 NOTE — Assessment & Plan Note (Signed)
-  Right breast inner lower quadrant pT1a pNX, stage IA invasive lobular carcinoma, grade 1, estrogen and progesterone receptor positive, HER-2 not amplified, with an Ki67 of 2%, diagnosed in 06/2017  -s/p surgery, adjuvant radiation and 5 years of Tamoxifen under Dr. Darnelle Catalan

## 2023-06-12 NOTE — Progress Notes (Signed)
 [facility]   [phone] [phone]   Clinic New Consult Note   Patient Care Team: Benita Stabile, MD as PCP - General (Internal Medicine) Magrinat, Valentino Hue, MD (Inactive) as Consulting Physician (Oncology) Harriette Bouillon, MD as Consulting Physician (General Surgery) Lonie Peak, MD as Attending Physician (Radiation Oncology) Daisy Lazar, DO as Referring Physician (Optometry) 06/12/2023  CHIEF COMPLAINTS/PURPOSE OF CONSULTATION:  Newly diagnosed left breast cancer  REFERRING PHYSICIAN: Breast center   Discussed the use of AI scribe software for clinical note transcription with the patient, who gave verbal consent to proceed.  History of Present Illness   Ms. Chuba, a 68 year old with a history of right breast cancer treated with lumpectomy, radiation, and five years of tamoxifen, presents for a new consult regarding a recent diagnosis of left breast cancer.  She presents to clinic with her husband.    The patient reports that her previous breast cancer was diagnosed in 2018 and treated successfully with no need for chemotherapy. She completed her tamoxifen treatment under the care of Dr. Darnelle Catalan and her primary care physician, Dr. Margo Aye, after Dr. Darnelle Catalan retired.  The patient's new breast cancer was detected on a routine screening mammogram. She reports no noticeable lump or any other symptoms. The patient has been diligent about her annual mammograms. She reports no pain or discomfort in the area of the new cancer.  In addition to her breast cancer history, the patient has a history of hypertension, fatty liver, kidney stones, acid reflux, and asthma. She is currently on Breo for her asthma and reports good control of her symptoms with this medication. She also has a history of osteopenia, for which she takes vitamin D and active calcium.  The patient has a family history of breast cancer in a maternal aunt and melanoma in a cousin. She has one  daughter and is concerned about her genetic risk.         MEDICAL HISTORY:  Past Medical History:  Diagnosis Date   Allergy    Asthma    Cancer (HCC)    Breast cancer   Environmental and seasonal allergies    Fatty liver    GERD (gastroesophageal reflux disease)    History of kidney stones    History of radiation therapy 07/17/17- 08/11/17   40.05 Gy directed to the right breast in 15 fractions, followed by Boost 10 Gy in 5 fractions   Hypertension    Papilloma of breast    right    SURGICAL HISTORY: Past Surgical History:  Procedure Laterality Date   BREAST BIOPSY Right 05/30/2017   Procedure: RIGHT NIPPLE BIOPSY;  Surgeon: Harriette Bouillon, MD;  Location: [facility];  Service: General;  Laterality: Right;   BREAST BIOPSY Left 05/02/2023   path pending   BREAST BIOPSY Left 05/02/2023   [place] LT BREAST BX W LOC DEV 1ST LESION IMG BX SPEC US GUIDE 05/02/2023 AP-ULTRASOUND   BREAST LUMPECTOMY WITH RADIOACTIVE SEED LOCALIZATION Right 05/30/2017   Procedure: RIGHT BREAST LUMPECTOMY WITH RADIOACTIVE SEED LOCALIZATION x2;  Surgeon: Harriette Bouillon, MD;  Location: [facility];  Service: General;  Laterality: Right;   BREAST LUMPECTOMY WITH SENTINEL LYMPH NODE BIOPSY Right 06/14/2017   Procedure: RE-EXCISION OF RIGHT BREAST LUMPECTOMY WITH RIGHT SENTINEL LYMPH NODE BX;  Surgeon: Harriette Bouillon, MD;  Location: [facility];  Service: General;  Laterality: Right;   CESAREAN SECTION     COLONOSCOPY  2009   Dr. Darrick Penna: normal. Screening in 10  years   CYST REMOVAL HAND Left     SOCIAL HISTORY: Social History   Socioeconomic History   Marital status: Married    Spouse name: Not on file   Number of children: 1   Years of education: Not on file   Highest education level: Not on file  Occupational History   Occupation: Diplomatic Services operational officer    Comment: [employer]  Tobacco Use   Smoking status: Never   Smokeless tobacco: Never  Vaping Use   Vaping status: Never Used   Substance and Sexual Activity   Alcohol use: Yes    Comment: rare    Drug use: No   Sexual activity: Not Currently  Other Topics Concern   Not on file  Social History Narrative   Not on file   Social Drivers of Health   Financial Resource Strain: Not on file  Food Insecurity: No Food Insecurity (06/07/2023)   Hunger Vital Sign    Worried About Running Out of Food in the Last Year: Never true    Ran Out of Food in the Last Year: Never true  Transportation Needs: No Transportation Needs (06/07/2023)   PRAPARE - Administrator, Civil Service (Medical): No    Lack of Transportation (Non-Medical): No  Physical Activity: Not on file  Stress: Not on file  Social Connections: Not on file  Intimate Partner Violence: Not At Risk (06/07/2023)   Humiliation, Afraid, Rape, and Kick questionnaire    Fear of Current or Ex-Partner: No    Emotionally Abused: No    Physically Abused: No    Sexually Abused: No    FAMILY HISTORY: Family History  Problem Relation Age of Onset   Heart disease Mother        has pacemaker   Heart attack Father    Heart disease Father    Hypertension Father    Cancer Maternal Aunt 60       breast cancer   Stroke Maternal Grandmother    Cancer Cousin        melanoma   Colon cancer Neg Hx    Colon polyps Neg Hx    Breast cancer Neg Hx    Allergic rhinitis Neg Hx    Angioedema Neg Hx    Asthma Neg Hx    Atopy Neg Hx    Eczema Neg Hx    Immunodeficiency Neg Hx    Urticaria Neg Hx     ALLERGIES:  is allergic to effexor [venlafaxine] and shellfish allergy.  MEDICATIONS:  Current Outpatient Medications  Medication Sig Dispense Refill   albuterol (VENTOLIN HFA) 108 (90 Base) MCG/ACT inhaler Inhale 2 puffs into the lungs every 4 (four) hours as needed for wheezing or shortness of breath. 18 g 1   amLODipine (NORVASC) 10 MG tablet Take 1 tablet (10 mg total) by mouth daily. 90 tablet 1   BREO ELLIPTA 100-25 MCG/ACT AEPB Inhale 1 puff into the  lungs daily. 60 each 3   cholecalciferol (VITAMIN D) 1000 UNITS tablet Take 1,000 Units by mouth daily.     EPINEPHrine (EPIPEN 2-PAK) 0.3 mg/0.3 mL IJ SOAJ injection Inject 0.3 mg into the muscle as needed for anaphylaxis. 2 each 1   fluticasone (FLONASE) 50 MCG/ACT nasal spray Place 1 spray into both nostrils daily.      olmesartan (BENICAR) 40 MG tablet Take 40 mg by mouth daily.     Propylene Glycol (SYSTANE BALANCE OP) Apply 1 drop to eye every morning. Pt may use up  to 4 times daily prn     No current facility-administered medications for this visit.    REVIEW OF SYSTEMS:   Constitutional: Denies fevers, chills or abnormal night sweats Eyes: Denies blurriness of vision, double vision or watery eyes Ears, nose, mouth, throat, and face: Denies mucositis or sore throat Respiratory: Denies cough, dyspnea or wheezes Cardiovascular: Denies palpitation, chest discomfort or lower extremity swelling Gastrointestinal:  Denies nausea, heartburn or change in bowel habits Skin: Denies abnormal skin rashes Lymphatics: Denies new lymphadenopathy or easy bruising Neurological:Denies numbness, tingling or new weaknesses Behavioral/Psych: Mood is stable, no new changes  All other systems were reviewed with the patient and are negative.  PHYSICAL EXAMINATION: ECOG PERFORMANCE STATUS: 0 - Asymptomatic  Vitals:   06/12/23 1440  BP: (!) 149/72  Pulse: 80  Resp: 18  Temp: 98 F (36.7 C)  SpO2: 98%   Filed Weights   06/12/23 1440  Weight: 151 lb 11.2 oz (68.8 kg)    GENERAL:alert, no distress and comfortable SKIN: skin color, texture, turgor are normal, no rashes or significant lesions EYES: normal, conjunctiva are pink and non-injected, sclera clear OROPHARYNX:no exudate, no erythema and lips, buccal mucosa, and tongue normal  NECK: supple, thyroid normal size, non-tender, without nodularity LYMPH:  no palpable lymphadenopathy in the cervical, axillary or inguinal LUNGS: clear to  auscultation and percussion with normal breathing effort HEART: regular rate & rhythm and no murmurs and no lower extremity edema ABDOMEN:abdomen soft, non-tender and normal bowel sounds Musculoskeletal:no cyanosis of digits and no clubbing  PSYCH: alert & oriented x 3 with fluent speech NEURO: no focal motor/sensory deficits  Physical Exam   NECK: No cervical lymphadenopathy. CHEST: No chest wall tenderness. Lungs clear to auscultation. CARDIOVASCULAR: Heart murmur present. BREAST: Right breast incision healed well. No masses in the breast. Breasts non-tender.      LABORATORY DATA:  I have reviewed the data as listed    Latest Ref Rng & Units 05/26/2020    2:56 PM 11/20/2019    1:35 PM 05/13/2019    2:56 PM  CBC  WBC 4.0 - 10.5 K/uL 10.5  12.1  10.5   Hemoglobin 12.0 - 15.0 g/dL 21.3  08.6  57.8   Hematocrit 36.0 - 46.0 % 43.6  45.3  45.0   Platelets 150 - 400 K/uL 271  303  290     @cmpl @  RADIOGRAPHIC STUDIES: I have personally reviewed the radiological images as listed and agreed with the findings in the report. No results found.  ASSESSMENT & PLAN:  68 year old female with history of right breast cancer, stage I, presented with newly diagnosed left breast cancer    Malignant neoplasm of upper-outer quadrant of left breast, cT1bN0M0 stage IA, ER+/PR+/HER2-, G2 -Discovered on screening mammogram showed a 6 mm mass in the 2 o'clock position of left breast. -Biopsy confirmed invasive mammary carcinoma, grade 2. ER 60% positive, PR 30% positive, HER2 negative, indicating hormone receptor-positive, HER2-negative subtype. Early stage (stage 1A), slow-growing with low KI67 proliferation index. Scheduled for surgery on June 21, 2023.  -I recommend post-surgery, Oncotype DX test to assess recurrence risk. Recurrence score <25: no chemotherapy; >=26: chemotherapy recommended. Cure rate likely >90%.  Given her ER and PR moderate intensity, and grade 2 disease, this is possible high risk  disease. -Anastrozole recommended post-surgery, due to stronger anti-estrogen effect compared to tamoxifen, with fewer risks of blood clots and endometrial cancer. - Proceed with scheduled surgery on June 21, 2023 - Perform Oncotype DX test on  surgical specimen to assess recurrence risk - Discuss potential need for chemotherapy based on Oncotype DX results - Consider radiation therapy post-surgery - Initiate anastrozole therapy post-surgery and radiation, if no chemotherapy is needed - Refer to genetic counseling for potential genetic testing  Hypertension Hypertension, a common comorbidity in this age group. No specific details about current management or control were discussed.  Asthma Asthma, well-controlled with Breo. Reports no need for a rescue inhaler and has an epinephrine pen for emergencies, which she has never used.  Osteopenia Osteopenia managed with vitamin D and calcium supplementation. No severe bone density issues reported.  Fatty Liver Fatty liver, but no specific details about current management or severity were discussed.  Plan -She will proceed breast surgery as planned -Oncotype on her surgical sample last tumor is 5 mm or less -I will see her towards the end of adjuvant radiation, or sooner if Oncotype shows high risk disease. -Genetic referral     Orders Placed This Encounter  Procedures   Ambulatory referral to Genetics    Referral Priority:   Routine    Referral Type:   Consultation    Referral Reason:   Specialty Services Required    Number of Visits Requested:   1    All questions were answered. The patient knows to call the clinic with any problems, questions or concerns. I spent 40 minutes counseling the patient face to face. The total time spent in the appointment was 50 minutes and more than 50% was on counseling.     Malachy Mood, MD 06/12/2023 4:27 PM

## 2023-06-13 ENCOUNTER — Encounter: Payer: Self-pay | Admitting: *Deleted

## 2023-06-15 ENCOUNTER — Other Ambulatory Visit: Payer: Self-pay

## 2023-06-15 ENCOUNTER — Encounter (HOSPITAL_BASED_OUTPATIENT_CLINIC_OR_DEPARTMENT_OTHER): Payer: Self-pay | Admitting: Surgery

## 2023-06-15 NOTE — Progress Notes (Signed)
   06/15/23 1630  PAT Phone Screen  Is the patient taking a GLP-1 receptor agonist? No  Do You Have Diabetes? No  Do You Have Hypertension? (S)  Yes  Have You Ever Been to the ER for Asthma? No  Have You Taken Oral Steroids in the Past 3 Months? No  Do you Take Phenteramine or any Other Diet Drugs? No  Recent  Lab Work, EKG, CXR? No  Do you have a history of heart problems? No  Any Recent Hospitalizations? No  Height 5\' 1"  (1.549 m)  Weight 68.8 kg  Pat Appointment Scheduled (S)  Yes (BMP, EKG)

## 2023-06-16 ENCOUNTER — Other Ambulatory Visit: Payer: Self-pay

## 2023-06-16 ENCOUNTER — Encounter (HOSPITAL_BASED_OUTPATIENT_CLINIC_OR_DEPARTMENT_OTHER)
Admission: RE | Admit: 2023-06-16 | Discharge: 2023-06-16 | Disposition: A | Discharge status: Home / Self Care | Source: Ambulatory Visit | Attending: Surgery | Admitting: Surgery

## 2023-06-16 DIAGNOSIS — I10 Essential (primary) hypertension: Secondary | ICD-10-CM | POA: Diagnosis not present

## 2023-06-16 DIAGNOSIS — Z1721 Progesterone receptor positive status: Secondary | ICD-10-CM | POA: Diagnosis not present

## 2023-06-16 DIAGNOSIS — Z1732 Human epidermal growth factor receptor 2 negative status: Secondary | ICD-10-CM | POA: Diagnosis not present

## 2023-06-16 DIAGNOSIS — Z01818 Encounter for other preprocedural examination: Secondary | ICD-10-CM | POA: Diagnosis present

## 2023-06-16 DIAGNOSIS — C50412 Malignant neoplasm of upper-outer quadrant of left female breast: Secondary | ICD-10-CM | POA: Diagnosis not present

## 2023-06-16 DIAGNOSIS — Z01812 Encounter for preprocedural laboratory examination: Secondary | ICD-10-CM | POA: Diagnosis not present

## 2023-06-16 DIAGNOSIS — Z170 Estrogen receptor positive status [ER+]: Secondary | ICD-10-CM | POA: Diagnosis not present

## 2023-06-16 LAB — BASIC METABOLIC PANEL
Anion gap: 9 (ref 5–15)
BUN: 17 mg/dL (ref 8–23)
CO2: 25 mmol/L (ref 22–32)
Calcium: 9.4 mg/dL (ref 8.9–10.3)
Chloride: 106 mmol/L (ref 98–111)
Creatinine, Ser: 0.83 mg/dL (ref 0.44–1.00)
GFR, Estimated: >60 mL/min (ref >60)
Glucose, Bld: 94 mg/dL (ref 70–99)
Potassium: 4.4 mmol/L (ref 3.5–5.1)
Sodium: 140 mmol/L (ref 135–145)

## 2023-06-16 MED ORDER — CHLORHEXIDINE GLUCONATE CLOTH 2 % EX PADS: 6.0000 | MEDICATED_PAD | Freq: Once | CUTANEOUS | Status: DC

## 2023-06-17 ENCOUNTER — Telehealth: Payer: Self-pay | Admitting: Hematology

## 2023-06-17 NOTE — Telephone Encounter (Signed)
 Patient is aware of scheduled appointment times/dates for follow up with Genetics

## 2023-06-20 ENCOUNTER — Ambulatory Visit
Admission: RE | Admit: 2023-06-20 | Discharge: 2023-06-20 | Disposition: A | Discharge status: Home / Self Care | Payer: Medicare Other | Source: Ambulatory Visit | Attending: Surgery | Admitting: Surgery

## 2023-06-20 DIAGNOSIS — C50912 Malignant neoplasm of unspecified site of left female breast: Secondary | ICD-10-CM

## 2023-06-20 HISTORY — PX: BREAST BIOPSY: SHX20

## 2023-06-21 ENCOUNTER — Ambulatory Visit
Admission: RE | Admit: 2023-06-21 | Discharge: 2023-06-21 | Disposition: A | Discharge status: Home / Self Care | Payer: Medicare Other | Source: Ambulatory Visit | Attending: Surgery | Admitting: Surgery

## 2023-06-21 ENCOUNTER — Ambulatory Visit (HOSPITAL_BASED_OUTPATIENT_CLINIC_OR_DEPARTMENT_OTHER)
Admission: RE | Admit: 2023-06-21 | Discharge: 2023-06-21 | Disposition: A | Discharge status: Home / Self Care | Payer: Medicare Other | Attending: Surgery | Admitting: Surgery

## 2023-06-21 ENCOUNTER — Encounter (HOSPITAL_BASED_OUTPATIENT_CLINIC_OR_DEPARTMENT_OTHER): Payer: Self-pay | Admitting: Surgery

## 2023-06-21 ENCOUNTER — Ambulatory Visit (HOSPITAL_BASED_OUTPATIENT_CLINIC_OR_DEPARTMENT_OTHER): Admitting: Anesthesiology

## 2023-06-21 ENCOUNTER — Other Ambulatory Visit: Payer: Self-pay

## 2023-06-21 ENCOUNTER — Encounter (HOSPITAL_BASED_OUTPATIENT_CLINIC_OR_DEPARTMENT_OTHER)
Admission: RE | Disposition: A | Discharge status: Home / Self Care | Payer: Self-pay | Source: Home / Self Care | Attending: Surgery

## 2023-06-21 DIAGNOSIS — Z1732 Human epidermal growth factor receptor 2 negative status: Secondary | ICD-10-CM | POA: Insufficient documentation

## 2023-06-21 DIAGNOSIS — C50912 Malignant neoplasm of unspecified site of left female breast: Secondary | ICD-10-CM

## 2023-06-21 DIAGNOSIS — Z170 Estrogen receptor positive status [ER+]: Secondary | ICD-10-CM | POA: Diagnosis not present

## 2023-06-21 DIAGNOSIS — K219 Gastro-esophageal reflux disease without esophagitis: Secondary | ICD-10-CM | POA: Diagnosis not present

## 2023-06-21 DIAGNOSIS — Z853 Personal history of malignant neoplasm of breast: Secondary | ICD-10-CM | POA: Diagnosis not present

## 2023-06-21 DIAGNOSIS — J45909 Unspecified asthma, uncomplicated: Secondary | ICD-10-CM | POA: Diagnosis not present

## 2023-06-21 DIAGNOSIS — Z7951 Long term (current) use of inhaled steroids: Secondary | ICD-10-CM | POA: Diagnosis not present

## 2023-06-21 DIAGNOSIS — Z79899 Other long term (current) drug therapy: Secondary | ICD-10-CM | POA: Diagnosis not present

## 2023-06-21 DIAGNOSIS — Z1721 Progesterone receptor positive status: Secondary | ICD-10-CM | POA: Insufficient documentation

## 2023-06-21 DIAGNOSIS — C50512 Malignant neoplasm of lower-outer quadrant of left female breast: Secondary | ICD-10-CM | POA: Diagnosis present

## 2023-06-21 DIAGNOSIS — I10 Essential (primary) hypertension: Secondary | ICD-10-CM | POA: Insufficient documentation

## 2023-06-21 HISTORY — PX: BREAST LUMPECTOMY WITH RADIOACTIVE SEED LOCALIZATION: SHX6424

## 2023-06-21 HISTORY — DX: Prediabetes: R73.03

## 2023-06-21 SURGERY — BREAST LUMPECTOMY WITH RADIOACTIVE SEED LOCALIZATION
Anesthesia: General | Site: Breast | Laterality: Left

## 2023-06-21 MED ORDER — PHENYLEPHRINE 80 MCG/ML (10ML) SYRINGE FOR IV PUSH (FOR BLOOD PRESSURE SUPPORT)
PREFILLED_SYRINGE | INTRAVENOUS | Status: AC
Start: 2023-06-21 — End: ?
  Filled 2023-06-21: qty 10

## 2023-06-21 MED ORDER — FENTANYL CITRATE (PF) 100 MCG/2ML IJ SOLN
INTRAMUSCULAR | Status: DC | PRN
Start: 2023-06-21 — End: 2023-06-21
  Administered 2023-06-21: 50 ug via INTRAVENOUS
  Administered 2023-06-21 (×2): 25 ug via INTRAVENOUS

## 2023-06-21 MED ORDER — LIDOCAINE 2% (20 MG/ML) 5 ML SYRINGE
INTRAMUSCULAR | Status: DC | PRN
  Administered 2023-06-21: 60 mg via INTRAVENOUS

## 2023-06-21 MED ORDER — DEXAMETHASONE SODIUM PHOSPHATE 10 MG/ML IJ SOLN
INTRAMUSCULAR | Status: DC | PRN
  Administered 2023-06-21: 8 mg via INTRAVENOUS

## 2023-06-21 MED ORDER — ONDANSETRON HCL 4 MG/2ML IJ SOLN
INTRAMUSCULAR | Status: AC
  Filled 2023-06-21: qty 2

## 2023-06-21 MED ORDER — PHENYLEPHRINE 80 MCG/ML (10ML) SYRINGE FOR IV PUSH (FOR BLOOD PRESSURE SUPPORT)
PREFILLED_SYRINGE | INTRAVENOUS | Status: DC | PRN
  Administered 2023-06-21 (×2): 80 ug via INTRAVENOUS

## 2023-06-21 MED ORDER — DEXMEDETOMIDINE HCL IN NACL 80 MCG/20ML IV SOLN
INTRAVENOUS | Status: DC | PRN
  Administered 2023-06-21 (×2): 4 ug via INTRAVENOUS

## 2023-06-21 MED ORDER — GABAPENTIN 300 MG PO CAPS
ORAL_CAPSULE | ORAL | Status: AC
  Filled 2023-06-21: qty 1

## 2023-06-21 MED ORDER — MIDAZOLAM HCL 5 MG/5ML IJ SOLN
INTRAMUSCULAR | Status: DC | PRN
  Administered 2023-06-21: 1 mg via INTRAVENOUS

## 2023-06-21 MED ORDER — KETOROLAC TROMETHAMINE 30 MG/ML IJ SOLN
INTRAMUSCULAR | Status: DC | PRN
Start: 2023-06-21 — End: 2023-06-21
  Administered 2023-06-21: 15 mg via INTRAVENOUS

## 2023-06-21 MED ORDER — PROPOFOL 10 MG/ML IV BOLUS
INTRAVENOUS | Status: DC | PRN
  Administered 2023-06-21: 160 mg via INTRAVENOUS

## 2023-06-21 MED ORDER — EPHEDRINE SULFATE-NACL 50-0.9 MG/10ML-% IV SOSY
PREFILLED_SYRINGE | INTRAVENOUS | Status: DC | PRN
  Administered 2023-06-21: 5 mg via INTRAVENOUS
  Administered 2023-06-21: 10 mg via INTRAVENOUS

## 2023-06-21 MED ORDER — FENTANYL CITRATE (PF) 100 MCG/2ML IJ SOLN: 25.0000 ug | INTRAMUSCULAR | Status: DC | PRN

## 2023-06-21 MED ORDER — OXYCODONE HCL 5 MG PO TABS: 5.0000 mg | ORAL_TABLET | Freq: Once | ORAL | Status: DC | PRN

## 2023-06-21 MED ORDER — ONDANSETRON HCL 4 MG/2ML IJ SOLN
INTRAMUSCULAR | Status: DC | PRN
  Administered 2023-06-21: 4 mg via INTRAVENOUS

## 2023-06-21 MED ORDER — BUPIVACAINE-EPINEPHRINE (PF) 0.25% -1:200000 IJ SOLN
INTRAMUSCULAR | Status: DC | PRN
  Administered 2023-06-21: 15 mL

## 2023-06-21 MED ORDER — ACETAMINOPHEN 500 MG PO TABS
1000.0000 mg | ORAL_TABLET | ORAL | Status: AC
  Administered 2023-06-21: 1000 mg via ORAL

## 2023-06-21 MED ORDER — DEXAMETHASONE SODIUM PHOSPHATE 10 MG/ML IJ SOLN
INTRAMUSCULAR | Status: AC
  Filled 2023-06-21: qty 1

## 2023-06-21 MED ORDER — ACETAMINOPHEN 500 MG PO TABS
ORAL_TABLET | ORAL | Status: AC
  Filled 2023-06-21: qty 2

## 2023-06-21 MED ORDER — 0.9 % SODIUM CHLORIDE (POUR BTL) OPTIME
TOPICAL | Status: DC | PRN
  Administered 2023-06-21: 1000 mL

## 2023-06-21 MED ORDER — EPHEDRINE 5 MG/ML INJ
INTRAVENOUS | Status: AC
  Filled 2023-06-21: qty 5

## 2023-06-21 MED ORDER — CEFAZOLIN SODIUM-DEXTROSE 3-4 GM/150ML-% IV SOLN
3.0000 g | INTRAVENOUS | Status: AC
  Administered 2023-06-21: 2 g via INTRAVENOUS

## 2023-06-21 MED ORDER — MIDAZOLAM HCL 2 MG/2ML IJ SOLN
INTRAMUSCULAR | Status: AC
Start: 2023-06-21 — End: ?
  Filled 2023-06-21: qty 2

## 2023-06-21 MED ORDER — GABAPENTIN 300 MG PO CAPS
300.0000 mg | ORAL_CAPSULE | ORAL | Status: AC
  Administered 2023-06-21: 300 mg via ORAL

## 2023-06-21 MED ORDER — LIDOCAINE 2% (20 MG/ML) 5 ML SYRINGE
INTRAMUSCULAR | Status: AC
  Filled 2023-06-21: qty 5

## 2023-06-21 MED ORDER — LACTATED RINGERS IV SOLN: INTRAVENOUS | Status: DC

## 2023-06-21 MED ORDER — OXYCODONE HCL 5 MG PO TABS: 5.0000 mg | ORAL_TABLET | Freq: Four times a day (QID) | ORAL | 0 refills | Status: DC | PRN

## 2023-06-21 MED ORDER — OXYCODONE HCL 5 MG/5ML PO SOLN: 5.0000 mg | Freq: Once | ORAL | Status: DC | PRN

## 2023-06-21 MED ORDER — GLYCOPYRROLATE PF 0.2 MG/ML IJ SOSY
PREFILLED_SYRINGE | INTRAMUSCULAR | Status: AC
  Filled 2023-06-21: qty 1

## 2023-06-21 MED ORDER — FENTANYL CITRATE (PF) 100 MCG/2ML IJ SOLN
INTRAMUSCULAR | Status: AC
  Filled 2023-06-21: qty 2

## 2023-06-21 MED ORDER — CEFAZOLIN SODIUM-DEXTROSE 2-4 GM/100ML-% IV SOLN
INTRAVENOUS | Status: AC
  Filled 2023-06-21: qty 100

## 2023-06-21 SURGICAL SUPPLY — 43 items
APPLIER CLIP 9.375 MED OPEN (MISCELLANEOUS) ×1 IMPLANT
BINDER BREAST LRG (GAUZE/BANDAGES/DRESSINGS) IMPLANT
BINDER BREAST MEDIUM (GAUZE/BANDAGES/DRESSINGS) IMPLANT
BINDER BREAST XLRG (GAUZE/BANDAGES/DRESSINGS) IMPLANT
BINDER BREAST XXLRG (GAUZE/BANDAGES/DRESSINGS) IMPLANT
BLADE SURG 15 STRL LF DISP TIS (BLADE) ×2 IMPLANT
CANISTER SUC SOCK COL 7IN (MISCELLANEOUS) IMPLANT
CANISTER SUCT 1200ML W/VALVE (MISCELLANEOUS) IMPLANT
CHLORAPREP W/TINT 26 (MISCELLANEOUS) ×2 IMPLANT
CLIP APPLIE 9.375 MED OPEN (MISCELLANEOUS) IMPLANT
COVER BACK TABLE 60X90IN (DRAPES) ×2 IMPLANT
COVER MAYO STAND STRL (DRAPES) ×2 IMPLANT
COVER PROBE CYLINDRICAL 5X96 (MISCELLANEOUS) ×2 IMPLANT
DERMABOND ADVANCED .7 DNX12 (GAUZE/BANDAGES/DRESSINGS) ×2 IMPLANT
DRAPE LAPAROSCOPIC ABDOMINAL (DRAPES) IMPLANT
DRAPE LAPAROTOMY 100X72 PEDS (DRAPES) ×2 IMPLANT
DRAPE UTILITY XL STRL (DRAPES) ×2 IMPLANT
ELECT COATED BLADE 2.86 ST (ELECTRODE) ×2 IMPLANT
ELECT REM PT RETURN 9FT ADLT (ELECTROSURGICAL) ×1 IMPLANT
ELECTRODE REM PT RTRN 9FT ADLT (ELECTROSURGICAL) ×2 IMPLANT
GLOVE BIOGEL PI IND STRL 8 (GLOVE) ×2 IMPLANT
GLOVE ECLIPSE 8.0 STRL XLNG CF (GLOVE) ×2 IMPLANT
GOWN STRL REUS W/ TWL LRG LVL3 (GOWN DISPOSABLE) ×4 IMPLANT
GOWN STRL REUS W/ TWL XL LVL3 (GOWN DISPOSABLE) ×2 IMPLANT
HEMOSTAT ARISTA ABSORB 3G PWDR (HEMOSTASIS) IMPLANT
HEMOSTAT SNOW SURGICEL 2X4 (HEMOSTASIS) IMPLANT
KIT MARKER MARGIN INK (KITS) ×2 IMPLANT
NDL HYPO 25X1 1.5 SAFETY (NEEDLE) ×2 IMPLANT
NEEDLE HYPO 25X1 1.5 SAFETY (NEEDLE) ×1 IMPLANT
NS IRRIG 1000ML POUR BTL (IV SOLUTION) ×2 IMPLANT
PACK BASIN DAY SURGERY FS (CUSTOM PROCEDURE TRAY) ×2 IMPLANT
PENCIL SMOKE EVACUATOR (MISCELLANEOUS) ×2 IMPLANT
SLEEVE SCD COMPRESS KNEE MED (STOCKING) ×2 IMPLANT
SPIKE FLUID TRANSFER (MISCELLANEOUS) IMPLANT
SPONGE T-LAP 4X18 ~~LOC~~+RFID (SPONGE) ×2 IMPLANT
SUT MNCRL AB 4-0 PS2 18 (SUTURE) ×2 IMPLANT
SUT SILK 2 0 SH (SUTURE) IMPLANT
SUT VICRYL 3-0 CR8 SH (SUTURE) ×2 IMPLANT
SYR CONTROL 10ML LL (SYRINGE) ×2 IMPLANT
TOWEL GREEN STERILE FF (TOWEL DISPOSABLE) ×2 IMPLANT
TRAY FAXITRON CT DISP (TRAY / TRAY PROCEDURE) ×2 IMPLANT
TUBE CONNECTING 20X1/4 (TUBING) IMPLANT
YANKAUER SUCT BULB TIP NO VENT (SUCTIONS) IMPLANT

## 2023-06-21 NOTE — Anesthesia Postprocedure Evaluation (Signed)
 Anesthesia Post Note  Patient: Susan Christensen  Procedure(s) Performed: LEFT BREAST SEED LUMPECTOMY (Left: Breast)     Patient location during evaluation: PACU Anesthesia Type: General Level of consciousness: awake and alert Pain management: pain level controlled Vital Signs Assessment: post-procedure vital signs reviewed and stable Respiratory status: spontaneous breathing, nonlabored ventilation, respiratory function stable and patient connected to nasal cannula oxygen Cardiovascular status: blood pressure returned to baseline and stable Postop Assessment: no apparent nausea or vomiting Anesthetic complications: no   No notable events documented.  Last Vitals:  Vitals:   06/21/23 1430 06/21/23 1444  BP: 119/61 130/64  Pulse: 81 85  Resp: 15 16  Temp:  (!) 36.2 C  SpO2: 96% 95%    Last Pain:  Vitals:   06/21/23 1444  TempSrc:   PainSc: 0-No pain                 Earl Lites P Aavya Shafer

## 2023-06-21 NOTE — Anesthesia Preprocedure Evaluation (Signed)
 Anesthesia Evaluation  Patient identified by MRN, date of birth, ID band Patient awake    Reviewed: Allergy & Precautions, NPO status , Patient's Chart, lab work & pertinent test results  Airway Mallampati: II  TM Distance: >3 FB Neck ROM: Full    Dental no notable dental hx.    Pulmonary asthma    Pulmonary exam normal        Cardiovascular hypertension, Pt. on medications  Rhythm:Regular Rate:Normal     Neuro/Psych negative neurological ROS  negative psych ROS   GI/Hepatic Neg liver ROS,GERD  ,,  Endo/Other  negative endocrine ROS    Renal/GU negative Renal ROS  negative genitourinary   Musculoskeletal Left breast Ca   Abdominal Normal abdominal exam  (+)   Peds  Hematology   Anesthesia Other Findings   Reproductive/Obstetrics                             Anesthesia Physical Anesthesia Plan  ASA: 2  Anesthesia Plan: General   Post-op Pain Management: Tylenol PO (pre-op)* and Gabapentin PO (pre-op)*   Induction: Intravenous  PONV Risk Score and Plan: 3 and Ondansetron, Dexamethasone, Midazolam and Treatment may vary due to age or medical condition  Airway Management Planned: LMA  Additional Equipment: None  Intra-op Plan:   Post-operative Plan: Extubation in OR  Informed Consent: I have reviewed the patients History and Physical, chart, labs and discussed the procedure including the risks, benefits and alternatives for the proposed anesthesia with the patient or authorized representative who has indicated his/her understanding and acceptance.     Dental advisory given  Plan Discussed with: CRNA  Anesthesia Plan Comments:        Anesthesia Quick Evaluation

## 2023-06-21 NOTE — Transfer of Care (Signed)
 Immediate Anesthesia Transfer of Care Note  Patient: Susan Christensen  Procedure(s) Performed: LEFT BREAST SEED LUMPECTOMY (Left: Breast)  Patient Location: PACU  Anesthesia Type:General  Level of Consciousness: drowsy  Airway & Oxygen Therapy: Patient Spontanous Breathing and Patient connected to face mask oxygen  Post-op Assessment: Report given to RN and Post -op Vital signs reviewed and stable  Post vital signs: Reviewed and stable  Last Vitals:  Vitals Value Taken Time  BP 113/53 06/21/23 1407  Temp    Pulse 84 06/21/23 1408  Resp 11 06/21/23 1408  SpO2 100 % 06/21/23 1408  Vitals shown include unfiled device data.  Last Pain:  Vitals:   06/21/23 1213  TempSrc: Tympanic  PainSc: 0-No pain      Patients Stated Pain Goal: 8 (06/21/23 1213)  Complications: No notable events documented.

## 2023-06-21 NOTE — Discharge Instructions (Addendum)
 [facility] Office Phone Number [phone]  BREAST BIOPSY/ PARTIAL MASTECTOMY: POST OP INSTRUCTIONS  Always review your discharge instruction sheet given to you by the facility where your surgery was performed.  IF YOU HAVE DISABILITY OR FAMILY LEAVE FORMS, YOU MUST BRING THEM TO THE OFFICE FOR PROCESSING.  DO NOT GIVE THEM TO YOUR DOCTOR.  A prescription for pain medication may be given to you upon discharge.  Take your pain medication as prescribed, if needed.  If narcotic pain medicine is not needed, then you may take acetaminophen (Tylenol) or ibuprofen (Advil) as needed. Take your usually prescribed medications unless otherwise directed If you need a refill on your pain medication, please contact your pharmacy.  They will contact our office to request authorization.  Prescriptions will not be filled after 5pm or on week-ends. You should eat very light the first 24 hours after surgery, such as soup, crackers, pudding, etc.  Resume your normal diet the day after surgery. Most patients will experience some swelling and bruising in the breast.  Ice packs and a good support bra will help.  Swelling and bruising can take several days to resolve.  It is common to experience some constipation if taking pain medication after surgery.  Increasing fluid intake and taking a stool softener will usually help or prevent this problem from occurring.  A mild laxative (Milk of Magnesia or Miralax) should be taken according to package directions if there are no bowel movements after 48 hours. Unless discharge instructions indicate otherwise, you may remove your bandages 24-48 hours after surgery, and you may shower at that time.  You may have steri-strips (small skin tapes) in place directly over the incision.  These strips should be left on the skin for 7-10 days.  If your surgeon used skin glue on the incision, you may shower in 24 hours.  The glue will flake off over the next 2-3 weeks.  Any  sutures or staples will be removed at the office during your follow-up visit. ACTIVITIES:  You may resume regular daily activities (gradually increasing) beginning the next day.  Wearing a good support bra or sports bra minimizes pain and swelling.  You may have sexual intercourse when it is comfortable. You may drive when you no longer are taking prescription pain medication, you can comfortably wear a seatbelt, and you can safely maneuver your car and apply brakes. RETURN TO WORK:  ______________________________________________________________________________________ Bonita Quin should see your doctor in the office for a follow-up appointment approximately two weeks after your surgery.  Your doctor's nurse will typically make your follow-up appointment when she calls you with your pathology report.  Expect your pathology report 2-3 business days after your surgery.  You may call to check if you do not hear from [place] after three days. OTHER INSTRUCTIONS: _______________________________________________________________________________________________ _____________________________________________________________________________________________________________________________________ _____________________________________________________________________________________________________________________________________ _____________________________________________________________________________________________________________________________________  WHEN TO CALL YOUR DOCTOR: Fever over 101.0 Nausea and/or vomiting. Extreme swelling or bruising. Continued bleeding from incision. Increased pain, redness, or drainage from the incision.  The clinic staff is available to answer your questions during regular business hours.  Please don't hesitate to call and ask to speak to one of the nurses for clinical concerns.  If you have a medical emergency, go to the nearest emergency room or call 911.  A surgeon from [facility] is always on call at the hospital.  For further questions, please visit [email or web address]     No Tylenol before 6:15pm. No ibuprofen before 9:30pm  Post Anesthesia Home Care Instructions  Activity: Get plenty of rest for the remainder of the day. A responsible individual must stay with you for 24 hours following the procedure.  For the next 24 hours, DO NOT: -Drive a car -Advertising copywriter -Drink alcoholic beverages -Take any medication unless instructed by your physician -Make any legal decisions or sign important papers.  Meals: Start with liquid foods such as gelatin or soup. Progress to regular foods as tolerated. Avoid greasy, spicy, heavy foods. If nausea and/or vomiting occur, drink only clear liquids until the nausea and/or vomiting subsides. Call your physician if vomiting continues.  Special Instructions/Symptoms: Your throat may feel dry or sore from the anesthesia or the breathing tube placed in your throat during surgery. If this causes discomfort, gargle with warm salt water. The discomfort should disappear within 24 hours.

## 2023-06-21 NOTE — Op Note (Signed)
 Preoperative diagnosis: Left breast cancer stage I lower outer quadrant  Postoperative diagnosis: Same  Procedure: Left breast seed localized lumpectomy  Surgeon: Harriette Bouillon, MD  Anesthesia: LMA with 0.25% Marcaine local  EBL: Minimal  Specimen: Left breast tissue with seed and clip verified by Faxitron and lumpectomy cavity margins all shaved.  All tissue oriented with ink  Drains: None  Indications for procedure: The patient presents for left breast lumpectomy for stage I left breast cancer.  She opted for breast conserving surgery after being seen in the multidisciplinary clinic.The procedure has been discussed with the patient. Alternatives to surgery have been discussed with the patient.  Risks of surgery include bleeding,  Infection,  Seroma formation, death,  and the need for further surgery.   The patient understands and wishes to proceed.    Description of procedure: The patient was met in the holding area questions were answered.  Seed was placed in the left breast as an outpatient.  Left side was marked as correct site.  She was taken back to the operative room.  She was placed upon upon the operating table.  After induction of general anesthesia, left breast prepped and draped in a sterile fashion and timeout performed.  Proper patient, site and procedure verified.  Neoprobe was used to identify the seed in the left lateral to lower outer quadrant.  A curvilinear incision was made of the signal.  All tissue was excised around the seed and clip with grossly negative margins.  All tissue was oriented with ink.  Imaging showed the seed and clip to be present.  I opted to shave all margins and did so in a similar fashion orienting all tissue with ink.  Clips were placed in the deep margins.  Hemostasis achieved with cautery and the cavity was irrigated.  The deep tissue planes were approximated with 3-0 Vicryl.  4-0 Monocryl was used to close the skin in a subcuticular fashion.   Dermabond applied.  All counts found to be correct.  Breast binder placed.  The patient was then awoke extubated taken to recovery in satisfactory condition.

## 2023-06-21 NOTE — H&P (Signed)
 History of Present Illness: Susan Christensen is a 68 y.o. female who is seen today as an office consultation for evaluation of Breast Cancer  Patient presents for evaluation of newly diagnosed left breast cancer upper outer quadrant. She has a history of right breast cancer from 2020 treated with breast conserving surgery and radiation therapy followed by antiestrogen therapy. She is only 6 years out from that. A 6 mm mass was noted left breast upper outer quadrant. Core biopsy showed invasive mammary carcinoma grade 2 found to be ductal nature ER positive PR positive HER2/neu negative with a KI of 2%. Denies any history of breast pain, breast mass or nipple discharge.  Review of Systems: A complete review of systems was obtained from the patient. I have reviewed this information and discussed as appropriate with the patient. See HPI as well for other ROS.    Medical History: No past medical history on file.  There is no problem list on file for this patient.  No past surgical history on file.   No Known Allergies  Current Outpatient Medications on File Prior to Visit  Medication Sig Dispense Refill  albuterol MDI, PROVENTIL, VENTOLIN, PROAIR, HFA 90 mcg/actuation inhaler Inhale 2 inhalations into the lungs every 4 (four) hours as needed for Wheezing or Shortness of Breath  amLODIPine (NORVASC) 10 MG tablet Take 10 mg by mouth once daily  BREO ELLIPTA 100-25 mcg/dose DsDv inhaler Inhale 1 Puff into the lungs once daily  cholecalciferol (VITAMIN D3) 1000 unit capsule Take 1,000 Units by mouth once daily  EPINEPHrine (EPIPEN) 0.3 mg/0.3 mL auto-injector INJECT 1 SYRINGE UNDER THE SKIN AS NEEDED FOR ALLERGIC REACTION.  fluticasone propionate (FLONASE) 50 mcg/actuation nasal spray Place 2 sprays into both nostrils once daily  olmesartan (BENICAR) 40 MG tablet Take 40 mg by mouth once daily  propylene glycoL (SYSTANE BALANCE) 0.6 % ophthalmic drops Place 1 drop into both eyes as needed for  Dry Eyes   No current facility-administered medications on file prior to visit.   No family history on file.   Social History   Tobacco Use  Smoking Status Not on file  Smokeless Tobacco Not on file    Social History   Socioeconomic History  Marital status: Unknown   Social Drivers of Health   Housing Stability: Unknown (06/05/2023)  Housing Stability Vital Sign  Homeless in the Last Year: No   Objective:   Vitals:  06/05/23 0911  BP: (!) 150/80  Weight: 67.9 kg (149 lb 9.6 oz)  Height: 154.9 cm (5\' 1" )  PainSc: 0-No pain   Body mass index is 28.27 kg/m.  Physical Exam Exam conducted with a chaperone present.  Cardiovascular:  Rate and Rhythm: Normal rate.  Pulmonary:  Effort: Pulmonary effort is normal.  Chest:  Breasts: Right: Normal.  Left: Normal.   Comments: Right breast shows postradiation and surgical changes. No masses in either breast. Musculoskeletal:  General: Normal range of motion.  Lymphadenopathy:  Upper Body:  Right upper body: No axillary adenopathy.  Left upper body: No axillary adenopathy.  Skin: General: Skin is warm.  Neurological:  General: No focal deficit present.  Mental Status: She is alert.  Psychiatric:  Mood and Affect: Mood normal.     Labs, Imaging and Diagnostic Testing:  FINAL MICROSCOPIC DIAGNOSIS:  A. BREAST, LEFT, NEEDLE CORE BIOPSY: - Invasive mammary carcinoma with extracellular mucin, see note - Tubule formation: Score 3 - Nuclear pleomorphism: Score 2 - Mitotic count: Score 1 - Total score: 6 -  Overall Grade: 2 - Lymphovascular invasion: Not identified - Cancer Length: 0.5 cm - Calcifications: Present - Other findings: None  COMMENT:  Dr. Kenyon Ana reviewed the case and concurs with the above diagnosis. A breast prognostic profile (ER, PR, Ki-67 and HER2) is pending and will be reported in an addendum. Ms. Randa Lynn was notified on 05/03/2023.  GROSS DESCRIPTION:  A. Received in formalin  labeled with the patient's name Susan Christensen) and "L breast is a 1.6 x 0.2 x 0.2 cm yellow-tan fibrofatty tissue core, submitted in toto in a single cassette(s). TIF 13:35, CIT <1 minute.  (LEF 05/02/2023)  Final Diagnosis performed by Holley Bouche, MD. Electronically signed 05/03/2023 Technical component performed at [facility], [address] [place], [place] [address]. Professional component performed at [facility], [address] [place], [place] [address]. Immunohistochemistry Technical component (if applicable) was performed at [facility]. [address], [place], [place] [address]. IMMUNOHISTOCHEMISTRY DISCLAIMER (if applicable): Some of these immunohistochemical stains may have been developed and the performance characteristics determine by [facility]. Some may not have been cleared or approved by the U.S. Food and Drug Administration. The FDA has determined that such clearance or approval is not necessary. This test is used for clinical purposes. It should not be regarded as investigational or for research. This laboratory is certified under the Clinical Laboratory Improvement Amendments of 1988 (CLIA-88) as qualified to perform high complexity clinical laboratory testing. The controls stained appropriately. IHC stains are performed on formalin fixed, paraffin embedded tissue using a 3,3"diaminobenzidine (DAB) chromogen and Leica Bond Autostainer System. The staining intensity of the nucleus is score manually and is reported as the percentage of tumor cell nuclei demonstrating specific nuclear staining. The specimens are fixed in 10% Neutral Formalin for at least 6 hours and up to 72hrs. These tests are validated on decalcified tissue. Results should be interpreted with caution given the possibility of false negative results on decalcified specimens. Antibody Clones are as follows  ER-clone 17F, PR-clone 16, Ki67- clone [id number]. Some of these immunohistochemical stains may have been developed and the performance characteristics determined by [facility].  ADDENDUM:  Breast, left, needle core biopsy  PROGNOSTIC INDICATORS  Results:  IMMUNOHISTOCHEMICAL AND MORPHOMETRIC ANALYSIS PERFORMED MANUALLY  The tumor cells are NEGATIVE for Her2 (1+).  Estrogen Receptor: 60%, POSITIVE, moderate staining intensity Progesterone Receptor: 30%, POSITIVE, moderate staining intensity Proliferation Marker Ki67: 2%  COMMENT: The negative hormone receptor study(ies) in this case has an internal positive control.  REFERENCE RANGE ESTROGEN RECEPTOR NEGATIVE 0% POSITIVE =>1% REFERENCE RANGE PROGESTERONE RECEPTOR NEGATIVE 0% POSITIVE =>1%  All controls stained appropriately  CLINICAL DATA: Short-term follow-up for probably benign left breast mass/asymmetry.  EXAM: DIGITAL DIAGNOSTIC UNILATERAL LEFT MAMMOGRAM WITH TOMOSYNTHESIS AND CAD; ULTRASOUND LEFT BREAST LIMITED  TECHNIQUE: Left digital diagnostic mammography and breast tomosynthesis was performed. The images were evaluated with computer-aided detection. ; Targeted ultrasound examination of the left breast was performed.  COMPARISON: Previous exam(s).  ACR Breast Density Category b: There are scattered areas of fibroglandular density.  FINDINGS: There is an oval 0.7 cm mass in the outer left breast now containing calcifications. No new masses or new abnormalities seen in the left breast.  Targeted ultrasound of the left breast was performed. There is an oval hypoechoic mass in the left breast at 2 o'clock 5 cm from nipple measuring 0.6 x 0.3 x 0.6 cm. This contains calcifications and corresponds well with the mass seen in the  outer left breast at. Normal lymph nodes are present in the left axilla.  IMPRESSION: Indeterminate 0.6 cm mass in the left breast at the 2  o'clock position.  RECOMMENDATION: Recommend ultrasound-guided core biopsy of the mass in the left breast at the 2 o'clock position.  I have discussed the findings and recommendations with the patient. If applicable, a reminder letter will be sent to the patient regarding the next appointment.  BI-RADS CATEGORY 4: Suspicious.   Electronically Signed By: Edwin Cap M.D. On: 04/18/2023 15:44  Assessment and Plan:   Diagnoses and all orders for this visit:  Breast cancer, stage 1, estrogen receptor positive, left (CMS/HHS-HCC)   Reviewed breast conserving surgery with her as well as mastectomy with reconstruction. Long-term survival, local regional recurrence and quality of life issues reviewed. Discussed omission of sentinel lymph node mapping and the state as well. Reviewed the literature about omission of some lymph node mapping and explained the limitations as well as age. Given her age as well as low risk features, I feel we could omit sentinel lymph node mapping. Explained that to the patient today as well as reviewed the pros and cons of this approach as well as complications of lymphedema from the procedure. She is fine with admitting lymph node mapping for now understanding that may need be done later if anything different is found. Discussed risk of bleeding, infection, cosmetic deformity, the need for further surgery, and reexcision.   Hayden Rasmussen, MD

## 2023-06-21 NOTE — Interval H&P Note (Signed)
 History and Physical Interval Note:  06/21/2023 12:29 PM  Susan Christensen  has presented today for surgery, with the diagnosis of LEFT BREAST CANCER.  The various methods of treatment have been discussed with the patient and family. After consideration of risks, benefits and other options for treatment, the patient has consented to  Procedure(s): LEFT BREAST SEED LUMPECTOMY (Left) as a surgical intervention.  The patient's history has been reviewed, patient examined, no change in status, stable for surgery.  I have reviewed the patient's chart and labs.  Questions were answered to the patient's satisfaction.   The procedure has been discussed with the patient. Alternatives to surgery have been discussed with the patient.  Risks of surgery include bleeding,  Infection,  Seroma formation, death,  and the need for further surgery.   The patient understands and wishes to proceed.   Curry Seefeldt A Arjan Strohm

## 2023-06-21 NOTE — Anesthesia Procedure Notes (Signed)
 Procedure Name: LMA Insertion Date/Time: 06/21/2023 1:06 PM  Performed by: Yolanda Bonine, CRNAPre-anesthesia Checklist: Patient identified, Emergency Drugs available, Suction available, Patient being monitored and Timeout performed Patient Re-evaluated:Patient Re-evaluated prior to induction Oxygen Delivery Method: Circle system utilized Preoxygenation: Pre-oxygenation with 100% oxygen Induction Type: IV induction Ventilation: Mask ventilation without difficulty LMA: LMA inserted LMA Size: 4.0 Number of attempts: 1 Placement Confirmation: positive ETCO2 Dental Injury: Teeth and Oropharynx as per pre-operative assessment

## 2023-06-22 ENCOUNTER — Encounter (HOSPITAL_BASED_OUTPATIENT_CLINIC_OR_DEPARTMENT_OTHER): Payer: Self-pay | Admitting: Surgery

## 2023-06-26 ENCOUNTER — Encounter: Payer: Self-pay | Admitting: *Deleted

## 2023-06-26 ENCOUNTER — Telehealth: Payer: Self-pay | Admitting: *Deleted

## 2023-06-26 ENCOUNTER — Encounter: Payer: Self-pay | Admitting: Surgery

## 2023-06-26 LAB — SURGICAL PATHOLOGY

## 2023-06-26 NOTE — Telephone Encounter (Signed)
 Received order for Oncotype Testing per Dr Mosetta Putt. Requisition faxed to pathology and [facility]

## 2023-06-30 ENCOUNTER — Encounter (HOSPITAL_COMMUNITY): Payer: Self-pay | Admitting: Hematology

## 2023-06-30 ENCOUNTER — Telehealth: Payer: Self-pay | Admitting: *Deleted

## 2023-06-30 NOTE — Telephone Encounter (Signed)
 Received notice from exact sciences that there was insufficient tissue. Request sent to pathology to send additional specimen since they stated that there was more to send for testing.

## 2023-07-06 ENCOUNTER — Encounter (HOSPITAL_COMMUNITY): Payer: Self-pay

## 2023-07-10 ENCOUNTER — Telehealth: Payer: Self-pay | Admitting: *Deleted

## 2023-07-10 ENCOUNTER — Telehealth: Payer: Self-pay | Admitting: Radiation Oncology

## 2023-07-10 ENCOUNTER — Encounter: Payer: Self-pay | Admitting: *Deleted

## 2023-07-10 DIAGNOSIS — C50412 Malignant neoplasm of upper-outer quadrant of left female breast: Secondary | ICD-10-CM

## 2023-07-10 NOTE — Telephone Encounter (Signed)
 Received message from exact sciences that there was again insufficient tissue for testing x2 now.  Per Dr. Mosetta Putt she can move forward with xrt.  Referral placed.

## 2023-07-10 NOTE — Telephone Encounter (Signed)
Called patient to schedule a FUN visit w. Dr. Isidore Moos. No answer, LVM for a return call.

## 2023-07-11 ENCOUNTER — Telehealth: Payer: Self-pay | Admitting: Radiation Oncology

## 2023-07-11 NOTE — Telephone Encounter (Signed)
Called patient to schedule a FUN visit w. Dr. Isidore Moos. No answer, LVM for a return call.

## 2023-07-11 NOTE — Progress Notes (Signed)
 Location of Breast Cancer: Left Breast Cancer, estrogen receptor positive   Histology per Pathology Report:    Receptor Status:  ER(60% Positive), PR (30% Positive), Her2-neu (Negative), Ki-67(2%)  Did patient present with symptoms (if so, please note symptoms) or was this found on screening mammography?:  Mammogram   Past/Anticipated interventions by surgeon, if any:  06/21/2023 Cornett, MD Left Breast Seed Localized Lumpectomy   05/30/17 Cornett, MD Malignant Neoplasm of Lower-Inner Quadrant of Right Breast, estrogen receptor positive Right superior breast lumpectomy    Past/Anticipated interventions by medical oncology, if any:  Radiation Therapy  Lymphedema issues, if any:  None    Pain issues, if any:  None.   SAFETY ISSUES: Prior radiation? Yes, radiation on right breast 6 years ago. Pacemaker/ICD? None Possible current pregnancy?None Is the patient on methotrexate? None  Current Complaints / other details:   None    BP (!) 152/64 (BP Location: Left Arm, Patient Position: Sitting)   Pulse 74   Resp 18   Ht 5\' 1"  (1.549 m)   Wt 150 lb 6.4 oz (68.2 kg)   SpO2 100%   BMI 28.42 kg/m     Wt Readings from Last 3 Encounters:  07/19/23 150 lb 6.4 oz (68.2 kg)  06/21/23 150 lb 9.2 oz (68.3 kg)  06/12/23 151 lb 11.2 oz (68.8 kg)

## 2023-07-11 NOTE — Telephone Encounter (Signed)
 Sent Deep Inspiration Breath Hold via MyChart.

## 2023-07-14 ENCOUNTER — Encounter: Payer: Self-pay | Admitting: *Deleted

## 2023-07-14 ENCOUNTER — Telehealth: Payer: Self-pay | Admitting: Nurse Practitioner

## 2023-07-14 DIAGNOSIS — C50412 Malignant neoplasm of upper-outer quadrant of left female breast: Secondary | ICD-10-CM

## 2023-07-14 NOTE — Telephone Encounter (Signed)
 Left a voicemail with appointment details and will be mailed a reminder.

## 2023-07-16 ENCOUNTER — Other Ambulatory Visit: Payer: Self-pay | Admitting: Allergy & Immunology

## 2023-07-17 DIAGNOSIS — Z170 Estrogen receptor positive status [ER+]: Secondary | ICD-10-CM | POA: Diagnosis not present

## 2023-07-17 DIAGNOSIS — C50412 Malignant neoplasm of upper-outer quadrant of left female breast: Secondary | ICD-10-CM | POA: Diagnosis not present

## 2023-07-18 NOTE — Progress Notes (Signed)
 Radiation Oncology         [phone] ________________________________  Name: Susan Christensen MRN: [id number]  Date: 07/19/2023  DOB: March 14, 1956  Follow-Up Visit Note  Outpatient  CC: Benita Stabile, MD  Malachy Mood, MD  Diagnosis:   [phone]   ICD-10-CM   1. Carcinoma of upper-outer quadrant of left breast in female, estrogen receptor positive (HCC)  C50.412    Z17.0       Cancer Staging  Carcinoma of upper-outer quadrant of left breast in female, estrogen receptor positive (HCC) Staging form: Breast, AJCC 8th Edition - Clinical stage from 06/07/2023: Stage IA (cT1b, cN0, cM0, G2, ER+, PR+, HER2-) - Unsigned Stage prefix: Initial diagnosis Histologic grading system: 3 grade system - Pathologic: Stage IA (pT1b, pN0, cM0, G1, ER+, PR+, HER2-) - Signed by Erven Colla, PA-C on 07/19/2023 Stage prefix: Initial diagnosis Histologic grading system: 3 grade system  Stage IA (pT1b, N0, M0) low grade invasive ductal carcinoma of the left breast, ER/PR+, HER2-; s/p left lumpectomy  History of right breast cancer diagnosed in 2019; s/p right lumpectomy, adjuvant radiation, and antiestrogen therapy   CHIEF COMPLAINT: Here to discuss management of left breast cancer  Narrative:  The patient returns today for follow-up. She was last seen in office on   06/07/23 for her initial consultation.     Since her original consultation, patient proceeded with a left breast seed lumpectomy on date of 06/21/23 under the care of Dr. Luisa Hart. Pathology revealed: tumor size of 1.0 cm; histology of low grade invasive ductal carcinoma with with mucinous features; all margins negative for invasive disease; nodal status not examined;  ER status: 60%, positive, moderate staining intensity; PR status 30%, positive, moderate staining intensity, Her2 status Negative; Ki-67: 2%   Oncotype DX was preformed however, report was inconclusive due to insufficient tissue x2. Dr. Mosetta Putt recommended proceeding with radiation  therapy.   During her post-op follow up on 07/14/23 with Dr. Luisa Hart, she reported recovering from procedure quite well without any signs of infection.    Symptomatically, the patient reports to be doing very well overall. She notes mild breast tenderness at the incision site, but otherwise denies any pain, swelling, or issues with range of motion.          ALLERGIES:  is allergic to effexor [venlafaxine] and shellfish allergy.  Meds: Current Outpatient Medications  Medication Sig Dispense Refill   albuterol (VENTOLIN HFA) 108 (90 Base) MCG/ACT inhaler Inhale 2 puffs into the lungs every 4 (four) hours as needed for wheezing or shortness of breath. 18 g 1   amLODipine (NORVASC) 10 MG tablet Take 1 tablet (10 mg total) by mouth daily. 90 tablet 1   BREO ELLIPTA 100-25 MCG/ACT AEPB INHALE 1 PUFF INTO THE LUNGS DAILY 60 each 3   cholecalciferol (VITAMIN D) 1000 UNITS tablet Take 1,000 Units by mouth daily.     EPINEPHrine (EPIPEN 2-PAK) 0.3 mg/0.3 mL IJ SOAJ injection Inject 0.3 mg into the muscle as needed for anaphylaxis. 2 each 1   fluticasone (FLONASE) 50 MCG/ACT nasal spray Place 1 spray into both nostrils daily.      olmesartan (BENICAR) 40 MG tablet Take 40 mg by mouth daily.     oxyCODONE (OXY IR/ROXICODONE) 5 MG immediate release tablet Take 1 tablet (5 mg total) by mouth every 6 (six) hours as needed for severe pain (pain score 7-10). 15 tablet 0   Propylene Glycol (SYSTANE BALANCE OP) Apply 1 drop to eye every morning. Pt  may use up to 4 times daily prn     No current facility-administered medications for this encounter.    Physical Findings:  height is 5\' 1"  (1.549 m) and weight is 150 lb 6.4 oz (68.2 kg). Her blood pressure is 152/64 (abnormal) and her pulse is 74. Her respiration is 18 and oxygen saturation is 100%. .     General: Alert and oriented, in no acute distress HEENT: Head is normocephalic. Extraocular movements are intact.  Neck: Neck is supple, no palpable cervical  or supraclavicular lymphadenopathy. Heart: Regular in rate and rhythm with no murmurs, rubs, or gallops. Chest: Clear to auscultation bilaterally, with no rhonchi, wheezes, or rales. Extremities: No cyanosis or edema. Lymphatics: see Neck Exam Musculoskeletal: symmetric strength and muscle tone throughout. Neurologic: No obvious focalities. Speech is fluent.  Psychiatric: Judgment and insight are intact. Affect is appropriate. Breast exam reveals surgical incision in the lower outer quadrant of the left breast with well-approximated skin edges and residual surgical glue. No signs of delayed wound healing or infection.   Lab Findings: Lab Results  Component Value Date   WBC 10.5 05/26/2020   HGB 14.6 05/26/2020   HCT 43.6 05/26/2020   MCV 93.0 05/26/2020   PLT 271 05/26/2020    @LASTCHEMISTRY @  Radiographic Findings: MM Breast Surgical Specimen Result Date: 06/21/2023 CLINICAL DATA:  Post excision of a left breast lesion following radioactive seed localization. SS surgical specimen. EXAM: SPECIMEN RADIOGRAPH OF THE LEFT BREAST COMPARISON:  Previous exam(s). FINDINGS: Status post excision of the left breast. The radioactive seed and biopsy marker clip are present, completely intact, and were marked for pathology. IMPRESSION: Specimen radiograph of the left breast. Electronically Signed   By: Amie Portland M.D.   On: 06/21/2023 13:36   MM LT RADIOACTIVE SEED LOC MAMMO GUIDE Result Date: 06/20/2023 CLINICAL DATA:  68 year old female presenting for seed localization of the left breast. Patient has recently diagnosed left breast invasive mammary carcinoma. EXAM: MAMMOGRAPHIC GUIDED RADIOACTIVE SEED LOCALIZATION OF THE LEFT BREAST COMPARISON:  Previous exam(s). FINDINGS: Patient presents for radioactive seed localization prior to left breast lumpectomy. I met with the patient and we discussed the procedure of seed localization including benefits and alternatives. We discussed the high likelihood  of a successful procedure. We discussed the risks of the procedure including infection, bleeding, tissue injury and further surgery. We discussed the low dose of radioactivity involved in the procedure. Informed, written consent was given. The usual time-out protocol was performed immediately prior to the procedure. Using mammographic guidance, sterile technique, 1% lidocaine and an I-125 radioactive seed, the ribbon biopsy marking clip in the left breast was localized using a lateral approach. The follow-up mammogram images confirm the seed in the expected location and were marked for Dr. Luisa Hart. Follow-up survey of the patient confirms presence of the radioactive seed. Order number of I-125 seed:  [id number]. Total activity: 0.254 mCi reference Date: 04/20/2023 The patient tolerated the procedure well and was [facility]. She was given instructions regarding seed removal. IMPRESSION: Radioactive seed localization left breast. No apparent complications. Electronically Signed   By: Emmaline Kluver M.D.   On: 06/20/2023 14:29    Impression/Plan: Stage IA (pT1b, N0, M0) low grade invasive ductal carcinoma of the left breast, ER/PR+, HER2-; s/p left lumpectomy  Patient appears to be healing well from her lumpectomy. We discussed adjuvant radiotherapy today. Dr. Basilio Cairo recommends adjuvant radiation in order to reduce the risk of locoregional disease recurrence.  I reviewed the logistics,  benefits, risks, and potential side effects of this treatment in detail. Risks may include but not necessary be limited to acute and late injury tissue in the radiation fields such as skin irritation (change in color/pigmentation, itching, dryness, pain, peeling). She may experience fatigue. We also discussed possible risk of long term cosmetic changes or scar tissue. There is also a smaller risk for lung toxicity, cardiac toxicity, lymphedema, musculoskeletal changes, rib fragility or induction of a second  malignancy, or late chronic non-healing soft tissue wound.    The patient asked good questions which I answered to her satisfaction. She is enthusiastic about proceeding with treatment. A consent form has been signed and placed in her chart.  The patient will receive 40.05 Gy in 15 fractions to the left breast with high tangents. This will be followed by a boost.  On date of service, in total, I spent 40 minutes on this encounter. Patient was seen in person.  _____________________________________    Bryan Lemma, PA-C  This document serves as a record of services personally performed by Bryan Lemma, PA-C. It was created on her behalf by Herbie Saxon, a trained medical scribe. The creation of this record is based on the scribe's personal observations and the provider's statements to them. This document has been checked and approved by the attending provider.

## 2023-07-19 ENCOUNTER — Ambulatory Visit
Admission: RE | Admit: 2023-07-19 | Discharge: 2023-07-19 | Disposition: A | Discharge status: Home / Self Care | Source: Ambulatory Visit | Attending: Radiation Oncology | Admitting: Radiation Oncology

## 2023-07-19 VITALS — BP 152/64 | HR 74 | Resp 18 | Ht 61.0 in | Wt 150.4 lb

## 2023-07-19 DIAGNOSIS — C50412 Malignant neoplasm of upper-outer quadrant of left female breast: Secondary | ICD-10-CM | POA: Insufficient documentation

## 2023-07-19 DIAGNOSIS — Z170 Estrogen receptor positive status [ER+]: Secondary | ICD-10-CM

## 2023-07-19 DIAGNOSIS — Z7951 Long term (current) use of inhaled steroids: Secondary | ICD-10-CM | POA: Diagnosis not present

## 2023-07-19 DIAGNOSIS — Z79899 Other long term (current) drug therapy: Secondary | ICD-10-CM | POA: Insufficient documentation

## 2023-07-19 DIAGNOSIS — Z79811 Long term (current) use of aromatase inhibitors: Secondary | ICD-10-CM | POA: Diagnosis not present

## 2023-07-19 DIAGNOSIS — Z510 Encounter for antineoplastic radiation therapy: Secondary | ICD-10-CM | POA: Diagnosis not present

## 2023-07-26 ENCOUNTER — Ambulatory Visit: Admitting: Radiation Oncology

## 2023-07-26 DIAGNOSIS — Z510 Encounter for antineoplastic radiation therapy: Secondary | ICD-10-CM | POA: Diagnosis not present

## 2023-07-26 DIAGNOSIS — C50412 Malignant neoplasm of upper-outer quadrant of left female breast: Secondary | ICD-10-CM | POA: Diagnosis not present

## 2023-07-26 DIAGNOSIS — Z170 Estrogen receptor positive status [ER+]: Secondary | ICD-10-CM | POA: Diagnosis not present

## 2023-07-27 ENCOUNTER — Ambulatory Visit

## 2023-07-28 ENCOUNTER — Ambulatory Visit

## 2023-07-31 ENCOUNTER — Ambulatory Visit
Admission: RE | Admit: 2023-07-31 | Discharge: 2023-07-31 | Disposition: A | Discharge status: Home / Self Care | Source: Ambulatory Visit | Attending: Radiation Oncology

## 2023-07-31 ENCOUNTER — Other Ambulatory Visit: Payer: Self-pay

## 2023-07-31 ENCOUNTER — Ambulatory Visit

## 2023-07-31 ENCOUNTER — Ambulatory Visit
Admission: RE | Admit: 2023-07-31 | Discharge: 2023-07-31 | Disposition: A | Discharge status: Home / Self Care | Source: Ambulatory Visit | Attending: Radiation Oncology | Admitting: Radiation Oncology

## 2023-07-31 DIAGNOSIS — Z170 Estrogen receptor positive status [ER+]: Secondary | ICD-10-CM | POA: Diagnosis not present

## 2023-07-31 DIAGNOSIS — C50412 Malignant neoplasm of upper-outer quadrant of left female breast: Secondary | ICD-10-CM | POA: Diagnosis not present

## 2023-07-31 DIAGNOSIS — Z510 Encounter for antineoplastic radiation therapy: Secondary | ICD-10-CM | POA: Diagnosis not present

## 2023-07-31 LAB — RAD ONC ARIA SESSION SUMMARY
Course Elapsed Days: 0
Plan Fractions Treated to Date: 1
Plan Prescribed Dose Per Fraction: 2.67 Gy
Plan Total Fractions Prescribed: 15
Plan Total Prescribed Dose: 40.05 Gy
Reference Point Dosage Given to Date: 2.67 Gy
Reference Point Session Dosage Given: 2.67 Gy
Session Number: 1

## 2023-08-01 ENCOUNTER — Other Ambulatory Visit: Payer: Self-pay

## 2023-08-01 ENCOUNTER — Ambulatory Visit
Admission: RE | Admit: 2023-08-01 | Discharge: 2023-08-01 | Disposition: A | Discharge status: Home / Self Care | Source: Ambulatory Visit | Attending: Radiation Oncology | Admitting: Radiation Oncology

## 2023-08-01 DIAGNOSIS — Z510 Encounter for antineoplastic radiation therapy: Secondary | ICD-10-CM | POA: Diagnosis not present

## 2023-08-01 DIAGNOSIS — C50412 Malignant neoplasm of upper-outer quadrant of left female breast: Secondary | ICD-10-CM | POA: Diagnosis not present

## 2023-08-01 DIAGNOSIS — Z170 Estrogen receptor positive status [ER+]: Secondary | ICD-10-CM | POA: Diagnosis not present

## 2023-08-01 LAB — RAD ONC ARIA SESSION SUMMARY
Course Elapsed Days: 1
Plan Fractions Treated to Date: 2
Plan Prescribed Dose Per Fraction: 2.67 Gy
Plan Total Fractions Prescribed: 15
Plan Total Prescribed Dose: 40.05 Gy
Reference Point Dosage Given to Date: 5.34 Gy
Reference Point Session Dosage Given: 2.67 Gy
Session Number: 2

## 2023-08-02 ENCOUNTER — Other Ambulatory Visit: Payer: Self-pay

## 2023-08-02 ENCOUNTER — Ambulatory Visit
Admission: RE | Admit: 2023-08-02 | Discharge: 2023-08-02 | Disposition: A | Discharge status: Home / Self Care | Source: Ambulatory Visit | Attending: Radiation Oncology | Admitting: Radiation Oncology

## 2023-08-02 DIAGNOSIS — Z510 Encounter for antineoplastic radiation therapy: Secondary | ICD-10-CM | POA: Diagnosis not present

## 2023-08-02 DIAGNOSIS — C50412 Malignant neoplasm of upper-outer quadrant of left female breast: Secondary | ICD-10-CM | POA: Diagnosis not present

## 2023-08-02 DIAGNOSIS — Z170 Estrogen receptor positive status [ER+]: Secondary | ICD-10-CM | POA: Diagnosis not present

## 2023-08-02 LAB — RAD ONC ARIA SESSION SUMMARY
Course Elapsed Days: 2
Plan Fractions Treated to Date: 3
Plan Prescribed Dose Per Fraction: 2.67 Gy
Plan Total Fractions Prescribed: 15
Plan Total Prescribed Dose: 40.05 Gy
Reference Point Dosage Given to Date: 8.01 Gy
Reference Point Session Dosage Given: 2.67 Gy
Session Number: 3

## 2023-08-03 ENCOUNTER — Other Ambulatory Visit: Payer: Self-pay

## 2023-08-03 ENCOUNTER — Ambulatory Visit
Admission: RE | Admit: 2023-08-03 | Discharge: 2023-08-03 | Disposition: A | Discharge status: Home / Self Care | Source: Ambulatory Visit | Attending: Radiation Oncology | Admitting: Radiation Oncology

## 2023-08-03 DIAGNOSIS — C50412 Malignant neoplasm of upper-outer quadrant of left female breast: Secondary | ICD-10-CM | POA: Diagnosis not present

## 2023-08-03 DIAGNOSIS — Z170 Estrogen receptor positive status [ER+]: Secondary | ICD-10-CM | POA: Diagnosis not present

## 2023-08-03 DIAGNOSIS — Z510 Encounter for antineoplastic radiation therapy: Secondary | ICD-10-CM | POA: Diagnosis not present

## 2023-08-03 LAB — RAD ONC ARIA SESSION SUMMARY
Course Elapsed Days: 3
Plan Fractions Treated to Date: 4
Plan Prescribed Dose Per Fraction: 2.67 Gy
Plan Total Fractions Prescribed: 15
Plan Total Prescribed Dose: 40.05 Gy
Reference Point Dosage Given to Date: 10.68 Gy
Reference Point Session Dosage Given: 2.67 Gy
Session Number: 4

## 2023-08-04 ENCOUNTER — Other Ambulatory Visit: Payer: Self-pay

## 2023-08-04 ENCOUNTER — Ambulatory Visit
Admission: RE | Admit: 2023-08-04 | Discharge: 2023-08-04 | Disposition: A | Discharge status: Home / Self Care | Source: Ambulatory Visit | Attending: Radiation Oncology | Admitting: Radiation Oncology

## 2023-08-04 DIAGNOSIS — Z170 Estrogen receptor positive status [ER+]: Secondary | ICD-10-CM | POA: Diagnosis not present

## 2023-08-04 DIAGNOSIS — C50412 Malignant neoplasm of upper-outer quadrant of left female breast: Secondary | ICD-10-CM | POA: Diagnosis not present

## 2023-08-04 DIAGNOSIS — Z510 Encounter for antineoplastic radiation therapy: Secondary | ICD-10-CM | POA: Diagnosis not present

## 2023-08-04 LAB — RAD ONC ARIA SESSION SUMMARY
Course Elapsed Days: 4
Plan Fractions Treated to Date: 5
Plan Prescribed Dose Per Fraction: 2.67 Gy
Plan Total Fractions Prescribed: 15
Plan Total Prescribed Dose: 40.05 Gy
Reference Point Dosage Given to Date: 13.35 Gy
Reference Point Session Dosage Given: 2.67 Gy
Session Number: 5

## 2023-08-07 ENCOUNTER — Ambulatory Visit
Admission: RE | Admit: 2023-08-07 | Discharge: 2023-08-07 | Disposition: A | Discharge status: Home / Self Care | Source: Ambulatory Visit | Attending: Radiation Oncology | Admitting: Radiation Oncology

## 2023-08-07 ENCOUNTER — Ambulatory Visit
Admission: RE | Admit: 2023-08-07 | Discharge: 2023-08-07 | Disposition: A | Discharge status: Home / Self Care | Source: Ambulatory Visit | Attending: Radiation Oncology

## 2023-08-07 ENCOUNTER — Other Ambulatory Visit: Payer: Self-pay

## 2023-08-07 DIAGNOSIS — Z510 Encounter for antineoplastic radiation therapy: Secondary | ICD-10-CM | POA: Diagnosis not present

## 2023-08-07 DIAGNOSIS — Z170 Estrogen receptor positive status [ER+]: Secondary | ICD-10-CM | POA: Diagnosis not present

## 2023-08-07 DIAGNOSIS — C50412 Malignant neoplasm of upper-outer quadrant of left female breast: Secondary | ICD-10-CM | POA: Diagnosis not present

## 2023-08-07 LAB — RAD ONC ARIA SESSION SUMMARY
Course Elapsed Days: 7
Plan Fractions Treated to Date: 6
Plan Prescribed Dose Per Fraction: 2.67 Gy
Plan Total Fractions Prescribed: 15
Plan Total Prescribed Dose: 40.05 Gy
Reference Point Dosage Given to Date: 16.02 Gy
Reference Point Session Dosage Given: 2.67 Gy
Session Number: 6

## 2023-08-08 ENCOUNTER — Other Ambulatory Visit: Payer: Self-pay

## 2023-08-08 ENCOUNTER — Ambulatory Visit
Admission: RE | Admit: 2023-08-08 | Discharge: 2023-08-08 | Disposition: A | Discharge status: Home / Self Care | Source: Ambulatory Visit | Attending: Radiation Oncology

## 2023-08-08 DIAGNOSIS — Z510 Encounter for antineoplastic radiation therapy: Secondary | ICD-10-CM | POA: Diagnosis not present

## 2023-08-08 DIAGNOSIS — Z170 Estrogen receptor positive status [ER+]: Secondary | ICD-10-CM | POA: Diagnosis not present

## 2023-08-08 DIAGNOSIS — C50412 Malignant neoplasm of upper-outer quadrant of left female breast: Secondary | ICD-10-CM | POA: Diagnosis not present

## 2023-08-08 LAB — RAD ONC ARIA SESSION SUMMARY
Course Elapsed Days: 8
Plan Fractions Treated to Date: 7
Plan Prescribed Dose Per Fraction: 2.67 Gy
Plan Total Fractions Prescribed: 15
Plan Total Prescribed Dose: 40.05 Gy
Reference Point Dosage Given to Date: 18.69 Gy
Reference Point Session Dosage Given: 2.67 Gy
Session Number: 7

## 2023-08-09 ENCOUNTER — Ambulatory Visit
Admission: RE | Admit: 2023-08-09 | Discharge: 2023-08-09 | Disposition: A | Discharge status: Home / Self Care | Source: Ambulatory Visit | Attending: Radiation Oncology

## 2023-08-09 ENCOUNTER — Other Ambulatory Visit: Payer: Self-pay

## 2023-08-09 DIAGNOSIS — Z510 Encounter for antineoplastic radiation therapy: Secondary | ICD-10-CM | POA: Diagnosis not present

## 2023-08-09 DIAGNOSIS — C50412 Malignant neoplasm of upper-outer quadrant of left female breast: Secondary | ICD-10-CM | POA: Diagnosis not present

## 2023-08-09 DIAGNOSIS — Z170 Estrogen receptor positive status [ER+]: Secondary | ICD-10-CM | POA: Diagnosis not present

## 2023-08-09 LAB — RAD ONC ARIA SESSION SUMMARY
Course Elapsed Days: 9
Plan Fractions Treated to Date: 8
Plan Prescribed Dose Per Fraction: 2.67 Gy
Plan Total Fractions Prescribed: 15
Plan Total Prescribed Dose: 40.05 Gy
Reference Point Dosage Given to Date: 21.36 Gy
Reference Point Session Dosage Given: 2.67 Gy
Session Number: 8

## 2023-08-10 ENCOUNTER — Other Ambulatory Visit: Payer: Self-pay

## 2023-08-10 ENCOUNTER — Ambulatory Visit
Admission: RE | Admit: 2023-08-10 | Discharge: 2023-08-10 | Disposition: A | Discharge status: Home / Self Care | Source: Ambulatory Visit | Attending: Radiation Oncology | Admitting: Radiation Oncology

## 2023-08-10 DIAGNOSIS — Z510 Encounter for antineoplastic radiation therapy: Secondary | ICD-10-CM | POA: Diagnosis not present

## 2023-08-10 DIAGNOSIS — C50412 Malignant neoplasm of upper-outer quadrant of left female breast: Secondary | ICD-10-CM | POA: Insufficient documentation

## 2023-08-10 DIAGNOSIS — Z170 Estrogen receptor positive status [ER+]: Secondary | ICD-10-CM | POA: Insufficient documentation

## 2023-08-10 LAB — RAD ONC ARIA SESSION SUMMARY
Course Elapsed Days: 10
Plan Fractions Treated to Date: 9
Plan Prescribed Dose Per Fraction: 2.67 Gy
Plan Total Fractions Prescribed: 15
Plan Total Prescribed Dose: 40.05 Gy
Reference Point Dosage Given to Date: 24.03 Gy
Reference Point Session Dosage Given: 2.67 Gy
Session Number: 9

## 2023-08-11 ENCOUNTER — Ambulatory Visit
Admission: RE | Admit: 2023-08-11 | Discharge: 2023-08-11 | Disposition: A | Discharge status: Home / Self Care | Source: Ambulatory Visit | Attending: Radiation Oncology

## 2023-08-11 ENCOUNTER — Other Ambulatory Visit: Payer: Self-pay

## 2023-08-11 DIAGNOSIS — C50412 Malignant neoplasm of upper-outer quadrant of left female breast: Secondary | ICD-10-CM | POA: Diagnosis not present

## 2023-08-11 DIAGNOSIS — Z170 Estrogen receptor positive status [ER+]: Secondary | ICD-10-CM | POA: Diagnosis not present

## 2023-08-11 DIAGNOSIS — Z510 Encounter for antineoplastic radiation therapy: Secondary | ICD-10-CM | POA: Diagnosis not present

## 2023-08-11 LAB — RAD ONC ARIA SESSION SUMMARY
Course Elapsed Days: 11
Plan Fractions Treated to Date: 10
Plan Prescribed Dose Per Fraction: 2.67 Gy
Plan Total Fractions Prescribed: 15
Plan Total Prescribed Dose: 40.05 Gy
Reference Point Dosage Given to Date: 26.7 Gy
Reference Point Session Dosage Given: 2.67 Gy
Session Number: 10

## 2023-08-14 ENCOUNTER — Ambulatory Visit: Admitting: Radiation Oncology

## 2023-08-14 ENCOUNTER — Ambulatory Visit
Admission: RE | Admit: 2023-08-14 | Discharge: 2023-08-14 | Disposition: A | Discharge status: Home / Self Care | Source: Ambulatory Visit | Attending: Radiation Oncology

## 2023-08-14 ENCOUNTER — Other Ambulatory Visit: Payer: Self-pay

## 2023-08-14 DIAGNOSIS — C50412 Malignant neoplasm of upper-outer quadrant of left female breast: Secondary | ICD-10-CM | POA: Diagnosis not present

## 2023-08-14 DIAGNOSIS — Z170 Estrogen receptor positive status [ER+]: Secondary | ICD-10-CM | POA: Diagnosis not present

## 2023-08-14 DIAGNOSIS — Z510 Encounter for antineoplastic radiation therapy: Secondary | ICD-10-CM | POA: Diagnosis not present

## 2023-08-14 LAB — RAD ONC ARIA SESSION SUMMARY
Course Elapsed Days: 14
Plan Fractions Treated to Date: 11
Plan Prescribed Dose Per Fraction: 2.67 Gy
Plan Total Fractions Prescribed: 15
Plan Total Prescribed Dose: 40.05 Gy
Reference Point Dosage Given to Date: 29.37 Gy
Reference Point Session Dosage Given: 2.67 Gy
Session Number: 11

## 2023-08-15 ENCOUNTER — Encounter: Payer: Self-pay | Admitting: Genetic Counselor

## 2023-08-15 ENCOUNTER — Inpatient Hospital Stay: Attending: Hematology | Admitting: Genetic Counselor

## 2023-08-15 ENCOUNTER — Other Ambulatory Visit: Payer: Self-pay | Admitting: Genetic Counselor

## 2023-08-15 ENCOUNTER — Ambulatory Visit
Admission: RE | Admit: 2023-08-15 | Discharge: 2023-08-15 | Disposition: A | Discharge status: Home / Self Care | Source: Ambulatory Visit | Attending: Radiation Oncology | Admitting: Radiation Oncology

## 2023-08-15 ENCOUNTER — Inpatient Hospital Stay

## 2023-08-15 ENCOUNTER — Other Ambulatory Visit: Payer: Self-pay

## 2023-08-15 DIAGNOSIS — Z1379 Encounter for other screening for genetic and chromosomal anomalies: Secondary | ICD-10-CM

## 2023-08-15 DIAGNOSIS — Z800 Family history of malignant neoplasm of digestive organs: Secondary | ICD-10-CM | POA: Diagnosis not present

## 2023-08-15 DIAGNOSIS — C50412 Malignant neoplasm of upper-outer quadrant of left female breast: Secondary | ICD-10-CM

## 2023-08-15 DIAGNOSIS — Z170 Estrogen receptor positive status [ER+]: Secondary | ICD-10-CM

## 2023-08-15 DIAGNOSIS — Z803 Family history of malignant neoplasm of breast: Secondary | ICD-10-CM | POA: Diagnosis not present

## 2023-08-15 DIAGNOSIS — Z808 Family history of malignant neoplasm of other organs or systems: Secondary | ICD-10-CM | POA: Diagnosis not present

## 2023-08-15 DIAGNOSIS — Z853 Personal history of malignant neoplasm of breast: Secondary | ICD-10-CM | POA: Diagnosis not present

## 2023-08-15 DIAGNOSIS — Z510 Encounter for antineoplastic radiation therapy: Secondary | ICD-10-CM | POA: Diagnosis not present

## 2023-08-15 LAB — RAD ONC ARIA SESSION SUMMARY
Course Elapsed Days: 15
Plan Fractions Treated to Date: 12
Plan Prescribed Dose Per Fraction: 2.67 Gy
Plan Total Fractions Prescribed: 15
Plan Total Prescribed Dose: 40.05 Gy
Reference Point Dosage Given to Date: 32.04 Gy
Reference Point Session Dosage Given: 2.67 Gy
Session Number: 12

## 2023-08-15 LAB — GENETIC SCREENING ORDER

## 2023-08-15 NOTE — Progress Notes (Signed)
 REFERRING PROVIDER: Sonja Hampton Bays, MD  PRIMARY PROVIDER:  Omie Bickers, MD  PRIMARY REASON FOR VISIT:  1. Carcinoma of upper-outer quadrant of left breast in female, estrogen receptor positive (HCC)   2. Family history of breast cancer   3. Family history of melanoma     HISTORY OF PRESENT ILLNESS:   Susan Christensen, a 68 y.o. female, was seen for a [place] cancer genetics consultation at the request of Dr. Maryalice Smaller due to a personal and family history of cancer.  Susan Christensen presents to clinic today to discuss the possibility of a hereditary predisposition to cancer, to discuss genetic testing, and to further clarify her future cancer risks, as well as potential cancer risks for family members.   Susan Christensen was diagnosed with left breast IDC at age 59 (ER/PR positive, HER2 negative) and she has a prior history of right breast ILC diagnosed at age 82 (ER/PR positive, HER2 negative).  CANCER HISTORY:  Oncology History  Carcinoma of upper-outer quadrant of left breast in female, estrogen receptor positive (HCC)  06/07/2023 Initial Diagnosis   Carcinoma of upper-outer quadrant of left breast in female, estrogen receptor positive (HCC)   07/19/2023 Cancer Staging   Staging form: Breast, AJCC 8th Edition - Pathologic: Stage IA (pT1b, pN0, cM0, G1, ER+, PR+, HER2-) - Signed by Pearlene Bouchard, PA-C on 07/19/2023 Stage prefix: Initial diagnosis Histologic grading system: 3 grade system     RISK FACTORS:  Ovaries intact: yes.  Uterus intact: yes.  Menopausal status: postmenopausal.  HRT use: 0 years.  Past Medical History:  Diagnosis Date   Allergy    Asthma    Cancer (HCC)    Breast cancer   Environmental and seasonal allergies    Fatty liver    GERD (gastroesophageal reflux disease)    History of kidney stones    History of radiation therapy 07/17/17- 08/11/17   40.05 Gy directed to the right breast in 15 fractions, followed by Boost 10 Gy in 5 fractions   Hypertension    Papilloma of breast     right   Pre-diabetes     Past Surgical History:  Procedure Laterality Date   BREAST BIOPSY Right 05/30/2017   Procedure: RIGHT NIPPLE BIOPSY;  Surgeon: Sim Dryer, MD;  Location: [facility];  Service: General;  Laterality: Right;   BREAST BIOPSY Left 05/02/2023   path pending   BREAST BIOPSY Left 05/02/2023   US  LT BREAST BX W LOC DEV 1ST LESION IMG BX SPEC US  GUIDE 05/02/2023 [facility]   BREAST BIOPSY  06/20/2023   MM LT RADIOACTIVE SEED LOC MAMMO GUIDE 06/20/2023 [facility] M[facility]MO[facility]   BREAST LUMPECTOMY WITH RADIOACTIVE SEED LOCALIZATION Right 05/30/2017   Procedure: RIGHT BREAST LUMPECTOMY WITH RADIOACTIVE SEED LOCALIZATION x2;  Surgeon: Sim Dryer, MD;  Location: [facility];  Service: General;  Laterality: Right;   BREAST LUMPECTOMY WITH RADIOACTIVE SEED LOCALIZATION Left 06/21/2023   Procedure: LEFT BREAST SEED LUMPECTOMY;  Surgeon: Sim Dryer, MD;  Location: [facility];  Service: General;  Laterality: Left;   BREAST LUMPECTOMY WITH SENTINEL LYMPH NODE BIOPSY Right 06/14/2017   Procedure: RE-EXCISION OF RIGHT BREAST LUMPECTOMY WITH RIGHT SENTINEL LYMPH NODE BX;  Surgeon: Sim Dryer, MD;  Location: [facility];  Service: General;  Laterality: Right;   CESAREAN SECTION     COLONOSCOPY  2009   Dr. Nolene Baumgarten: normal. Screening in 10 years   CYST REMOVAL HAND Left     Social History   Socioeconomic  History   Marital status: Married    Spouse name: Not on file   Number of children: 1   Years of education: Not on file   Highest education level: Not on file  Occupational History   Occupation: Diplomatic Services operational officer    Comment: [employer]  Tobacco Use   Smoking status: Never   Smokeless tobacco: Never  Vaping Use   Vaping status: Never Used  Substance and Sexual Activity   Alcohol use: Yes    Comment: rare    Drug use: No   Sexual activity: Not Currently  Other Topics Concern   Not on file  Social History Narrative   Not on  file   Social Drivers of Health   Financial Resource Strain: Not on file  Food Insecurity: No Food Insecurity (06/07/2023)   Hunger Vital Sign    Worried About Running Out of Food in the Last Year: Never true    Ran Out of Food in the Last Year: Never true  Transportation Needs: No Transportation Needs (06/07/2023)   PRAPARE - Administrator, Civil Service (Medical): No    Lack of Transportation (Non-Medical): No  Physical Activity: Not on file  Stress: Not on file  Social Connections: Not on file     FAMILY HISTORY:  We obtained a detailed, 4-generation family history.  Significant diagnoses are listed below: Family History  Problem Relation Age of Onset   Heart disease Mother        has pacemaker   Heart attack Father    Heart disease Father    Hypertension Father    Breast cancer Maternal Aunt 4 - 69   Stroke Maternal Grandmother    Melanoma Cousin 29   Colon cancer Neg Hx    Colon polyps Neg Hx    Allergic rhinitis Neg Hx    Angioedema Neg Hx    Asthma Neg Hx    Atopy Neg Hx    Eczema Neg Hx    Immunodeficiency Neg Hx    Urticaria Neg Hx      Susan Christensen is unaware of previous family history of genetic testing for hereditary cancer risks. There is no reported Ashkenazi Jewish ancestry.   GENETIC COUNSELING ASSESSMENT: Susan Christensen is a 68 y.o. female with a personal and family history of cancer which is somewhat suggestive of a hereditary predisposition to cancer given her personal history of contralateral breast cancer. We, therefore, discussed and recommended the following at today's visit.   DISCUSSION: We discussed that 5 - 10% of cancer is hereditary, with most cases of breast cancer associated with BRCA1/2.  There are other genes that can be associated with hereditary breast cancer syndromes.  We discussed that testing is beneficial for several reasons including knowing how to follow individuals after completing their treatment, identifying whether  potential treatment options would be beneficial, and understanding if other family members could be at risk for cancer and allowing them to undergo genetic testing.   We reviewed the characteristics, features and inheritance patterns of hereditary cancer syndromes. We also discussed genetic testing, including the appropriate family members to test, the process of testing, insurance coverage and turn-around-time for results. We discussed the implications of a negative, positive, carrier and/or variant of uncertain significant result. We recommended Susan Christensen pursue genetic testing for a panel that includes genes associated with breast cancer and melanoma.   Susan Christensen  was offered a common hereditary cancer panel (39 genes+melanoma genes) and an expanded pan-cancer panel (  77 genes). Susan Christensen was informed of the benefits and limitations of each panel, including that expanded pan-cancer panels contain genes that do not have clear management guidelines at this point in time.  We also discussed that as the number of genes included on a panel increases, the chances of variants of uncertain significance increases. After considering the benefits and limitations of each gene panel, Susan Christensen elected to have [facility] CancerNext-Expanded Panel.  The CancerNext-Expanded gene panel offered by [facility] and includes sequencing, rearrangement, and RNA analysis for the following 77 genes: AIP, ALK, APC, ATM, AXIN2, BAP1, BARD1, BMPR1A, BRCA1, BRCA2, BRIP1, CDC73, CDH1, CDK4, CDKN1B, CDKN2A, CEBPA, CHEK2, CTNNA1, DDX41, DICER1, ETV6, FH, FLCN, GATA2, LZTR1, MAX, MBD4, MEN1, MET, MLH1, MSH2, MSH3, MSH6, MUTYH, NF1, NF2, NTHL1, PALB2, PHOX2B, PMS2, POT1, PRKAR1A, PTCH1, PTEN, RAD51C, RAD51D, RB1, RET, RPS20, RUNX1, SDHA, SDHAF2, SDHB, SDHC, SDHD, SMAD4, SMARCA4, SMARCB1, SMARCE1, STK11, SUFU, TMEM127, TP53, TSC1, TSC2, VHL, and WT1 (sequencing and deletion/duplication); EGFR, HOXB13, KIT, MITF, PDGFRA, POLD1, and POLE  (sequencing only); EPCAM and GREM1 (deletion/duplication only).   Based on Susan Christensen's personal and family history of cancer, she meets medical criteria for genetic testing. Despite that she meets criteria, she may still have an out of pocket cost. We discussed that if her out of pocket cost for testing is over $100, the laboratory should contact them to discuss self-pay prices, patient pay assistance programs, if applicable, and other billing options.  PLAN: After considering the risks, benefits, and limitations, Susan Christensen provided informed consent to pursue genetic testing and the blood sample was sent to [facility] for analysis of the CancerNext-Expanded Panel. Results should be available within approximately 2-3 weeks' time, at which point they will be disclosed by telephone to Susan Christensen, as will any additional recommendations warranted by these results. Susan Christensen will receive a summary of her genetic counseling visit and a copy of her results once available. This information will also be available in Epic.   Susan Christensen questions were answered to her satisfaction today. Our contact information was provided should additional questions or concerns arise. Thank you for the referral and allowing us  to share in the care of your patient.   Susan Nieman, MS, [facility] Genetic Counselor [email or web address] (P) [phone]  40 minutes were spent on the date of the encounter in service to the patient including preparation, face-to-face consultation, documentation and care coordination.  The patient brought her husband. Drs. Gudena and/or Maryalice Smaller were available to discuss this case as needed.   _______________________________________________________________________ For Office Staff:  Number of people involved in session: 2 Was an Intern/ student involved with case: no

## 2023-08-16 ENCOUNTER — Ambulatory Visit: Admitting: Radiation Oncology

## 2023-08-16 ENCOUNTER — Other Ambulatory Visit: Payer: Self-pay

## 2023-08-16 ENCOUNTER — Ambulatory Visit
Admission: RE | Admit: 2023-08-16 | Discharge: 2023-08-16 | Disposition: A | Discharge status: Home / Self Care | Source: Ambulatory Visit | Attending: Radiation Oncology | Admitting: Radiation Oncology

## 2023-08-16 DIAGNOSIS — C50412 Malignant neoplasm of upper-outer quadrant of left female breast: Secondary | ICD-10-CM | POA: Diagnosis not present

## 2023-08-16 DIAGNOSIS — Z510 Encounter for antineoplastic radiation therapy: Secondary | ICD-10-CM | POA: Diagnosis not present

## 2023-08-16 DIAGNOSIS — Z170 Estrogen receptor positive status [ER+]: Secondary | ICD-10-CM | POA: Diagnosis not present

## 2023-08-16 LAB — RAD ONC ARIA SESSION SUMMARY
Course Elapsed Days: 16
Plan Fractions Treated to Date: 13
Plan Prescribed Dose Per Fraction: 2.67 Gy
Plan Total Fractions Prescribed: 15
Plan Total Prescribed Dose: 40.05 Gy
Reference Point Dosage Given to Date: 34.71 Gy
Reference Point Session Dosage Given: 2.67 Gy
Session Number: 13

## 2023-08-17 ENCOUNTER — Other Ambulatory Visit: Payer: Self-pay

## 2023-08-17 ENCOUNTER — Ambulatory Visit
Admission: RE | Admit: 2023-08-17 | Discharge: 2023-08-17 | Disposition: A | Discharge status: Home / Self Care | Source: Ambulatory Visit | Attending: Radiation Oncology | Admitting: Radiation Oncology

## 2023-08-17 DIAGNOSIS — C50412 Malignant neoplasm of upper-outer quadrant of left female breast: Secondary | ICD-10-CM | POA: Diagnosis not present

## 2023-08-17 DIAGNOSIS — Z510 Encounter for antineoplastic radiation therapy: Secondary | ICD-10-CM | POA: Diagnosis not present

## 2023-08-17 DIAGNOSIS — Z170 Estrogen receptor positive status [ER+]: Secondary | ICD-10-CM | POA: Diagnosis not present

## 2023-08-17 LAB — RAD ONC ARIA SESSION SUMMARY
Course Elapsed Days: 17
Plan Fractions Treated to Date: 14
Plan Prescribed Dose Per Fraction: 2.67 Gy
Plan Total Fractions Prescribed: 15
Plan Total Prescribed Dose: 40.05 Gy
Reference Point Dosage Given to Date: 37.38 Gy
Reference Point Session Dosage Given: 2.67 Gy
Session Number: 14

## 2023-08-18 ENCOUNTER — Other Ambulatory Visit: Payer: Self-pay

## 2023-08-18 ENCOUNTER — Ambulatory Visit
Admission: RE | Admit: 2023-08-18 | Discharge: 2023-08-18 | Disposition: A | Discharge status: Home / Self Care | Source: Ambulatory Visit | Attending: Radiation Oncology | Admitting: Radiation Oncology

## 2023-08-18 DIAGNOSIS — C50412 Malignant neoplasm of upper-outer quadrant of left female breast: Secondary | ICD-10-CM | POA: Diagnosis not present

## 2023-08-18 DIAGNOSIS — Z170 Estrogen receptor positive status [ER+]: Secondary | ICD-10-CM | POA: Diagnosis not present

## 2023-08-18 DIAGNOSIS — Z510 Encounter for antineoplastic radiation therapy: Secondary | ICD-10-CM | POA: Diagnosis not present

## 2023-08-18 LAB — RAD ONC ARIA SESSION SUMMARY
Course Elapsed Days: 18
Plan Fractions Treated to Date: 15
Plan Prescribed Dose Per Fraction: 2.67 Gy
Plan Total Fractions Prescribed: 15
Plan Total Prescribed Dose: 40.05 Gy
Reference Point Dosage Given to Date: 40.05 Gy
Reference Point Session Dosage Given: 2.67 Gy
Session Number: 15

## 2023-08-21 ENCOUNTER — Ambulatory Visit

## 2023-08-21 ENCOUNTER — Other Ambulatory Visit: Payer: Self-pay

## 2023-08-21 ENCOUNTER — Ambulatory Visit
Admission: RE | Admit: 2023-08-21 | Discharge: 2023-08-21 | Disposition: A | Discharge status: Home / Self Care | Source: Ambulatory Visit | Attending: Radiation Oncology | Admitting: Radiation Oncology

## 2023-08-21 DIAGNOSIS — Z510 Encounter for antineoplastic radiation therapy: Secondary | ICD-10-CM | POA: Diagnosis not present

## 2023-08-21 DIAGNOSIS — C50412 Malignant neoplasm of upper-outer quadrant of left female breast: Secondary | ICD-10-CM | POA: Diagnosis not present

## 2023-08-21 DIAGNOSIS — Z170 Estrogen receptor positive status [ER+]: Secondary | ICD-10-CM | POA: Diagnosis not present

## 2023-08-21 LAB — RAD ONC ARIA SESSION SUMMARY
Course Elapsed Days: 21
Plan Fractions Treated to Date: 1
Plan Prescribed Dose Per Fraction: 2 Gy
Plan Total Fractions Prescribed: 5
Plan Total Prescribed Dose: 10 Gy
Reference Point Dosage Given to Date: 2 Gy
Reference Point Session Dosage Given: 2 Gy
Session Number: 16

## 2023-08-22 ENCOUNTER — Ambulatory Visit

## 2023-08-22 ENCOUNTER — Ambulatory Visit
Admission: RE | Admit: 2023-08-22 | Discharge: 2023-08-22 | Disposition: A | Discharge status: Home / Self Care | Source: Ambulatory Visit | Attending: Radiation Oncology | Admitting: Radiation Oncology

## 2023-08-22 ENCOUNTER — Other Ambulatory Visit: Payer: Self-pay

## 2023-08-22 DIAGNOSIS — Z170 Estrogen receptor positive status [ER+]: Secondary | ICD-10-CM | POA: Diagnosis not present

## 2023-08-22 DIAGNOSIS — Z510 Encounter for antineoplastic radiation therapy: Secondary | ICD-10-CM | POA: Diagnosis not present

## 2023-08-22 DIAGNOSIS — C50412 Malignant neoplasm of upper-outer quadrant of left female breast: Secondary | ICD-10-CM | POA: Diagnosis not present

## 2023-08-22 LAB — RAD ONC ARIA SESSION SUMMARY
Course Elapsed Days: 22
Plan Fractions Treated to Date: 2
Plan Prescribed Dose Per Fraction: 2 Gy
Plan Total Fractions Prescribed: 5
Plan Total Prescribed Dose: 10 Gy
Reference Point Dosage Given to Date: 4 Gy
Reference Point Session Dosage Given: 2 Gy
Session Number: 17

## 2023-08-23 ENCOUNTER — Ambulatory Visit
Admission: RE | Admit: 2023-08-23 | Discharge: 2023-08-23 | Disposition: A | Discharge status: Home / Self Care | Source: Ambulatory Visit | Attending: Radiation Oncology

## 2023-08-23 ENCOUNTER — Other Ambulatory Visit: Payer: Self-pay

## 2023-08-23 ENCOUNTER — Telehealth: Payer: Self-pay | Admitting: Hematology

## 2023-08-23 DIAGNOSIS — Z510 Encounter for antineoplastic radiation therapy: Secondary | ICD-10-CM | POA: Diagnosis not present

## 2023-08-23 DIAGNOSIS — Z170 Estrogen receptor positive status [ER+]: Secondary | ICD-10-CM | POA: Diagnosis not present

## 2023-08-23 DIAGNOSIS — C50412 Malignant neoplasm of upper-outer quadrant of left female breast: Secondary | ICD-10-CM | POA: Diagnosis not present

## 2023-08-23 LAB — RAD ONC ARIA SESSION SUMMARY
Course Elapsed Days: 23
Plan Fractions Treated to Date: 3
Plan Prescribed Dose Per Fraction: 2 Gy
Plan Total Fractions Prescribed: 5
Plan Total Prescribed Dose: 10 Gy
Reference Point Dosage Given to Date: 6 Gy
Reference Point Session Dosage Given: 2 Gy
Session Number: 18

## 2023-08-24 ENCOUNTER — Inpatient Hospital Stay: Admitting: Hematology

## 2023-08-24 ENCOUNTER — Other Ambulatory Visit: Payer: Self-pay

## 2023-08-24 ENCOUNTER — Ambulatory Visit
Admission: RE | Admit: 2023-08-24 | Discharge: 2023-08-24 | Disposition: A | Discharge status: Home / Self Care | Source: Ambulatory Visit | Attending: Radiation Oncology | Admitting: Radiation Oncology

## 2023-08-24 DIAGNOSIS — C50412 Malignant neoplasm of upper-outer quadrant of left female breast: Secondary | ICD-10-CM | POA: Diagnosis not present

## 2023-08-24 DIAGNOSIS — Z170 Estrogen receptor positive status [ER+]: Secondary | ICD-10-CM | POA: Diagnosis not present

## 2023-08-24 DIAGNOSIS — Z510 Encounter for antineoplastic radiation therapy: Secondary | ICD-10-CM | POA: Diagnosis not present

## 2023-08-24 LAB — RAD ONC ARIA SESSION SUMMARY
Course Elapsed Days: 24
Plan Fractions Treated to Date: 4
Plan Prescribed Dose Per Fraction: 2 Gy
Plan Total Fractions Prescribed: 5
Plan Total Prescribed Dose: 10 Gy
Reference Point Dosage Given to Date: 8 Gy
Reference Point Session Dosage Given: 2 Gy
Session Number: 19

## 2023-08-25 ENCOUNTER — Ambulatory Visit
Admission: RE | Admit: 2023-08-25 | Discharge: 2023-08-25 | Disposition: A | Discharge status: Home / Self Care | Source: Ambulatory Visit | Attending: Radiation Oncology | Admitting: Radiation Oncology

## 2023-08-25 ENCOUNTER — Other Ambulatory Visit: Payer: Self-pay

## 2023-08-25 DIAGNOSIS — E1169 Type 2 diabetes mellitus with other specified complication: Secondary | ICD-10-CM | POA: Diagnosis not present

## 2023-08-25 DIAGNOSIS — C50412 Malignant neoplasm of upper-outer quadrant of left female breast: Secondary | ICD-10-CM | POA: Diagnosis not present

## 2023-08-25 DIAGNOSIS — E559 Vitamin D deficiency, unspecified: Secondary | ICD-10-CM | POA: Diagnosis not present

## 2023-08-25 DIAGNOSIS — E785 Hyperlipidemia, unspecified: Secondary | ICD-10-CM | POA: Diagnosis not present

## 2023-08-25 DIAGNOSIS — Z170 Estrogen receptor positive status [ER+]: Secondary | ICD-10-CM | POA: Diagnosis not present

## 2023-08-25 DIAGNOSIS — Z510 Encounter for antineoplastic radiation therapy: Secondary | ICD-10-CM | POA: Diagnosis not present

## 2023-08-25 LAB — RAD ONC ARIA SESSION SUMMARY
Course Elapsed Days: 25
Plan Fractions Treated to Date: 5
Plan Prescribed Dose Per Fraction: 2 Gy
Plan Total Fractions Prescribed: 5
Plan Total Prescribed Dose: 10 Gy
Reference Point Dosage Given to Date: 10 Gy
Reference Point Session Dosage Given: 2 Gy
Session Number: 20

## 2023-08-28 NOTE — Radiation Completion Notes (Signed)
 Patient Name: Susan Christensen, Susan Christensen MRN: [id number] Date of Birth: 08-29-1955 Referring Physician: ZACK HALL, M.D. Date of Service: 2023-08-28 Radiation Oncologist: Colie Dawes, M.D. [facility]                             RADIATION ONCOLOGY END OF TREATMENT NOTE     Diagnosis: C50.412 Malignant neoplasm of upper-outer quadrant of left female breast Staging on 2023-07-19: Carcinoma of upper-outer quadrant of left breast in female, estrogen receptor positive (HCC) T=pT1b, N=pN0, M=cM0 Staging on 2023-06-07: Carcinoma of upper-outer quadrant of left breast in female, estrogen receptor positive (HCC) T=cT1b, N=cN0, M=cM0 Intent: Curative     ==========DELIVERED PLANS==========  First Treatment Date: 2023-07-31 Last Treatment Date: 2023-08-25   Plan Name: [facility] Site: Breast, Left Technique: 3D Mode: Photon Dose Per Fraction: 2.67 Gy Prescribed Dose (Delivered / Prescribed): 40.05 Gy / 40.05 Gy Prescribed Fxs (Delivered / Prescribed): 15 / 15   Plan Name: [facility] Site: Breast, Left Technique: 3D Mode: Photon Dose Per Fraction: 2 Gy Prescribed Dose (Delivered / Prescribed): 10 Gy / 10 Gy Prescribed Fxs (Delivered / Prescribed): 5 / 5     ==========ON TREATMENT VISIT DATES========== 2023-07-31, 2023-08-07, 2023-08-14, 2023-08-21     ==========UPCOMING VISITS==========       ==========APPENDIX - ON TREATMENT VISIT NOTES==========   See weekly On Treatment Notes in Epic for details in the Media tab (listed as Progress notes on the On Treatment Visit Dates listed above).

## 2023-08-29 ENCOUNTER — Encounter: Payer: Self-pay | Admitting: Genetic Counselor

## 2023-08-29 ENCOUNTER — Inpatient Hospital Stay: Admitting: Hematology

## 2023-08-29 ENCOUNTER — Telehealth: Payer: Self-pay | Admitting: Genetic Counselor

## 2023-08-29 VITALS — BP 138/68 | HR 89 | Temp 98.3°F | Resp 18 | Ht 61.0 in | Wt 154.5 lb

## 2023-08-29 DIAGNOSIS — M8580 Other specified disorders of bone density and structure, unspecified site: Secondary | ICD-10-CM | POA: Insufficient documentation

## 2023-08-29 DIAGNOSIS — Z79899 Other long term (current) drug therapy: Secondary | ICD-10-CM | POA: Insufficient documentation

## 2023-08-29 DIAGNOSIS — Z1379 Encounter for other screening for genetic and chromosomal anomalies: Secondary | ICD-10-CM | POA: Insufficient documentation

## 2023-08-29 DIAGNOSIS — Z87442 Personal history of urinary calculi: Secondary | ICD-10-CM | POA: Insufficient documentation

## 2023-08-29 DIAGNOSIS — C50412 Malignant neoplasm of upper-outer quadrant of left female breast: Secondary | ICD-10-CM | POA: Diagnosis not present

## 2023-08-29 DIAGNOSIS — Z923 Personal history of irradiation: Secondary | ICD-10-CM | POA: Insufficient documentation

## 2023-08-29 DIAGNOSIS — I10 Essential (primary) hypertension: Secondary | ICD-10-CM | POA: Diagnosis not present

## 2023-08-29 DIAGNOSIS — Z1721 Progesterone receptor positive status: Secondary | ICD-10-CM | POA: Diagnosis not present

## 2023-08-29 DIAGNOSIS — J45909 Unspecified asthma, uncomplicated: Secondary | ICD-10-CM | POA: Diagnosis not present

## 2023-08-29 DIAGNOSIS — Z1732 Human epidermal growth factor receptor 2 negative status: Secondary | ICD-10-CM | POA: Insufficient documentation

## 2023-08-29 DIAGNOSIS — Z170 Estrogen receptor positive status [ER+]: Secondary | ICD-10-CM | POA: Insufficient documentation

## 2023-08-29 DIAGNOSIS — Z79811 Long term (current) use of aromatase inhibitors: Secondary | ICD-10-CM | POA: Diagnosis not present

## 2023-08-29 DIAGNOSIS — L539 Erythematous condition, unspecified: Secondary | ICD-10-CM | POA: Diagnosis not present

## 2023-08-29 DIAGNOSIS — R232 Flushing: Secondary | ICD-10-CM | POA: Diagnosis not present

## 2023-08-29 MED ORDER — ANASTROZOLE 1 MG PO TABS: 1.0000 mg | ORAL_TABLET | Freq: Every day | ORAL | 3 refills | Status: DC

## 2023-08-29 NOTE — Progress Notes (Signed)
 [facility]   [phone] [phone]   Clinic Follow up Note   Patient Care Team: Omie Bickers, MD as PCP - General (Internal Medicine) Sim Dryer, MD as Consulting Physician (General Surgery) Colie Dawes, MD as Attending Physician (Radiation Oncology) Lucendia Rusk, DO as Referring Physician (Optometry) Sonja Oak Hill, MD as Consulting Physician (Hematology) Alane Hsu, RN as Oncology Nurse Navigator Auther Bo, RN as Oncology Nurse Navigator  Date of Service:  08/29/2023  CHIEF COMPLAINT: f/u of left breast cancer   CURRENT THERAPY:  Pending adjuvant anastrozole   Oncology History   Carcinoma of upper-outer quadrant of left breast in female, estrogen receptor positive (HCC) -pT1bN0M0, stage IA, er+/pr+/her2-, G1 -Discovered on screening mammogram showed a 6 mm mass in the 2 o'clock position of left breast.  - She underwent lumpectomy on June 21, 2023, which showed 1 cm grade 1 invasive ductal carcinoma, margins were negative. -Oncotype DX was attempted but insufficient tissue. -She completed adjuvant radiation on Aug 25, 2023 -I recommend adjuvant AI, she will start anastrozole in early June 2025.  Assessment & Plan Breast cancer Completed radiation therapy for ER, PR positive breast cancer with a 1 cm tumor and negative margins. The tumor was low grade and low risk, hence chemotherapy was not required. Post-radiation skin changes include redness, sensitivity, peeling, and pigment loss, particularly under the arm. Discomfort occurs when applying lotion and in hot, sticky conditions. Anastrozole is recommended due to osteopenia and previous tamoxifen  use. Side effects may include hot flashes, mood swings, and joint pain, which occurs in 40% of patients. If severe, a switch to exemestane or tamoxifen  may be considered. Anastrozole may negatively impact bone density, necessitating monitoring. - Prescribe anastrozole, to be started  mid-June after skin heals. - Monitor for side effects such as hot flashes, mood swings, and joint pain. - Advise on managing side effects with exercise and acetaminophen  if needed. - Schedule survivorship follow-up on August 12. - Continue annual mammograms. - Advise to report any severe side effects or new symptoms. - Monitor bone density, especially after starting anastrozole.  Osteopenia Osteopenia with a bone density T-score of -1.9. Her 10-year risk of major osteoporotic fracture is 10.7%, and hip fracture risk is 1.6%, not considered high. Bone density has improved since the last scan, likely due to calcium and vitamin D supplementation and weight-bearing exercises. Anastrozole may negatively impact bone density, so monitoring is necessary. - Continue calcium and vitamin D supplementation. - Continue weight-bearing exercises.  Plan - I have called in anastrozole 1 mg daily, she will start in mid June 2025 - She is scheduled for survivorship in August - Lab and follow-up with me in 6 months   SUMMARY OF ONCOLOGIC HISTORY: Oncology History  Carcinoma of upper-outer quadrant of left breast in female, estrogen receptor positive (HCC)  06/07/2023 Initial Diagnosis   Carcinoma of upper-outer quadrant of left breast in female, estrogen receptor positive (HCC)   07/19/2023 Cancer Staging   Staging form: Breast, AJCC 8th Edition - Pathologic: Stage IA (pT1b, pN0, cM0, G1, ER+, PR+, HER2-) - Signed by Pearlene Bouchard, PA-C on 07/19/2023 Stage prefix: Initial diagnosis Histologic grading system: 3 grade system    Genetic Testing   [facility] CancerNext-Expanded Panel+RNA was Negative. Report date is 08/28/2023.   The CancerNext-Expanded gene panel offered by [facility] and includes sequencing, rearrangement, and RNA analysis for the following 77 genes: AIP, ALK, APC, ATM, AXIN2, BAP1, BARD1, BMPR1A, BRCA1, BRCA2, BRIP1, CDC73, CDH1, CDK4,  CDKN1B, CDKN2A, CEBPA, CHEK2, CTNNA1, DDX41, DICER1, ETV6,  FH, FLCN, GATA2, LZTR1, MAX, MBD4, MEN1, MET, MLH1, MSH2, MSH3, MSH6, MUTYH, NF1, NF2, NTHL1, PALB2, PHOX2B, PMS2, POT1, PRKAR1A, PTCH1, PTEN, RAD51C, RAD51D, RB1, RET, RPS20, RUNX1, SDHA, SDHAF2, SDHB, SDHC, SDHD, SMAD4, SMARCA4, SMARCB1, SMARCE1, STK11, SUFU, TMEM127, TP53, TSC1, TSC2, VHL, and WT1 (sequencing and deletion/duplication); EGFR, HOXB13, KIT, MITF, PDGFRA, POLD1, and POLE (sequencing only); EPCAM and GREM1 (deletion/duplication only).       Discussed the use of AI scribe software for clinical note transcription with the patient, who gave verbal consent to proceed.  History of Present Illness Susan RINGER "[name]" is a 68 year old female with breast cancer who presents for follow-up after completing radiation therapy.  She completed radiation therapy for breast cancer last Friday. She experiences skin redness and pigment loss in the treated area, particularly under her arm, which stings when lotion is applied. She uses Radioplex lotion and hydrocortisone for itching. The skin is sensitive, especially after a shower, and she experiences pain when the area gets hot and sticky.  She had surgery for a one-centimeter ER, PR positive breast tumor with negative margins. An Oncotype test was attempted but could not be completed due to insufficient tumor tissue. She did not receive chemotherapy and proceeded with radiation therapy.  She has osteopenia with recent improvement on a bone scan. She takes active calcium tablets and vitamin D, and engages in weight-bearing exercises like walking. She previously took tamoxifen  for five years after a prior breast cancer diagnosis on the other side. No joint pain or arthritis. She has taken gabapentin  for hot flashes but did not experience significant menopausal symptoms.     All other systems were reviewed with the patient and are negative.  MEDICAL HISTORY:  Past Medical History:  Diagnosis Date   Allergy    Asthma    Cancer (HCC)     Breast cancer   Environmental and seasonal allergies    Fatty liver    GERD (gastroesophageal reflux disease)    History of kidney stones    History of radiation therapy 07/17/17- 08/11/17   40.05 Gy directed to the right breast in 15 fractions, followed by Boost 10 Gy in 5 fractions   Hypertension    Papilloma of breast    right   Pre-diabetes     SURGICAL HISTORY: Past Surgical History:  Procedure Laterality Date   BREAST BIOPSY Right 05/30/2017   Procedure: RIGHT NIPPLE BIOPSY;  Surgeon: Sim Dryer, MD;  Location: [facility];  Service: General;  Laterality: Right;   BREAST BIOPSY Left 05/02/2023   path pending   BREAST BIOPSY Left 05/02/2023   US  LT BREAST BX W LOC DEV 1ST LESION IMG BX SPEC US  GUIDE 05/02/2023 [facility]   BREAST BIOPSY  06/20/2023   MM LT RADIOACTIVE SEED LOC MAMMO GUIDE 06/20/2023 [facility] MAMMOGRAPHY   BREAST LUMPECTOMY WITH RADIOACTIVE SEED LOCALIZATION Right 05/30/2017   Procedure: RIGHT BREAST LUMPECTOMY WITH RADIOACTIVE SEED LOCALIZATION x2;  Surgeon: Sim Dryer, MD;  Location: [facility];  Service: General;  Laterality: Right;   BREAST LUMPECTOMY WITH RADIOACTIVE SEED LOCALIZATION Left 06/21/2023   Procedure: LEFT BREAST SEED LUMPECTOMY;  Surgeon: Sim Dryer, MD;  Location: [facility];  Service: General;  Laterality: Left;   BREAST LUMPECTOMY WITH SENTINEL LYMPH NODE BIOPSY Right 06/14/2017   Procedure: RE-EXCISION OF RIGHT BREAST LUMPECTOMY WITH RIGHT SENTINEL LYMPH NODE BX;  Surgeon: Sim Dryer, MD;  Location: [facility];  Service: General;  Laterality: Right;   CESAREAN SECTION     COLONOSCOPY  2009   Dr. Nolene Baumgarten: normal. Screening in 10 years   CYST REMOVAL HAND Left     I have reviewed the social history and family history with the patient and they are unchanged from previous note.  ALLERGIES:  is allergic to effexor  [venlafaxine ] and shellfish allergy.  MEDICATIONS:  Current Outpatient  Medications  Medication Sig Dispense Refill   albuterol  (VENTOLIN  HFA) 108 (90 Base) MCG/ACT inhaler Inhale 2 puffs into the lungs every 4 (four) hours as needed for wheezing or shortness of breath. 18 g 1   amLODipine  (NORVASC ) 10 MG tablet Take 1 tablet (10 mg total) by mouth daily. 90 tablet 1   anastrozole (ARIMIDEX) 1 MG tablet Take 1 tablet (1 mg total) by mouth daily. 30 tablet 3   BREO ELLIPTA  100-25 MCG/ACT AEPB INHALE 1 PUFF INTO THE LUNGS DAILY 60 each 3   cholecalciferol (VITAMIN D) 1000 UNITS tablet Take 1,000 Units by mouth daily.     EPINEPHrine  (EPIPEN  2-PAK) 0.3 mg/0.3 mL IJ SOAJ injection Inject 0.3 mg into the muscle as needed for anaphylaxis. 2 each 1   fluticasone (FLONASE) 50 MCG/ACT nasal spray Place 1 spray into both nostrils daily.      olmesartan (BENICAR) 40 MG tablet Take 40 mg by mouth daily.     oxyCODONE  (OXY IR/ROXICODONE ) 5 MG immediate release tablet Take 1 tablet (5 mg total) by mouth every 6 (six) hours as needed for severe pain (pain score 7-10). 15 tablet 0   Propylene Glycol (SYSTANE BALANCE OP) Apply 1 drop to eye every morning. Pt may use up to 4 times daily prn     No current facility-administered medications for this visit.    PHYSICAL EXAMINATION: ECOG PERFORMANCE STATUS: 0 - Asymptomatic  Vitals:   08/29/23 1417 08/29/23 1419  BP: (!) 144/62 138/68  Pulse: 89   Resp: 18   Temp: 98.3 F (36.8 C)   SpO2: 95%    Wt Readings from Last 3 Encounters:  08/29/23 154 lb 8 oz (70.1 kg)  07/19/23 150 lb 6.4 oz (68.2 kg)  06/21/23 150 lb 9.2 oz (68.3 kg)     GENERAL:alert, no distress and comfortable SKIN: skin color, texture, turgor are normal, (+) significant skin hyperpigmentation with area of hypopigmentation in left breast and axilla  EYES: normal, Conjunctiva are pink and non-injected, sclera clear NECK: supple, thyroid normal size, non-tender, without nodularity LYMPH:  no palpable lymphadenopathy in the cervical, axillary  LUNGS: clear  to auscultation and percussion with normal breathing effort HEART: regular rate & rhythm and no murmurs and no lower extremity edema ABDOMEN:abdomen soft, non-tender and normal bowel sounds Musculoskeletal:no cyanosis of digits and no clubbing  NEURO: alert & oriented x 3 with fluent speech, no focal motor/sensory deficits  Physical Exam   LABORATORY DATA:  I have reviewed the data as listed    Latest Ref Rng & Units 05/26/2020    2:56 PM 11/20/2019    1:35 PM 05/13/2019    2:56 PM  CBC  WBC 4.0 - 10.5 K/uL 10.5  12.1  10.5   Hemoglobin 12.0 - 15.0 g/dL 16.1  09.6  04.5   Hematocrit 36.0 - 46.0 % 43.6  45.3  45.0   Platelets 150 - 400 K/uL 271  303  290         Latest Ref Rng & Units 06/16/2023    2:30 PM 05/26/2020  2:56 PM 05/13/2019    2:56 PM  CMP  Glucose 70 - 99 mg/dL 94  96  84   BUN 8 - 23 mg/dL 17  11  16    Creatinine 0.44 - 1.00 mg/dL 1.61  0.96  0.45   Sodium 135 - 145 mmol/L 140  141  141   Potassium 3.5 - 5.1 mmol/L 4.4  4.1  3.9   Chloride 98 - 111 mmol/L 106  105  107   CO2 22 - 32 mmol/L 25  25  24    Calcium 8.9 - 10.3 mg/dL 9.4  9.2  8.6   Total Protein 6.5 - 8.1 g/dL  7.6  7.0   Total Bilirubin 0.3 - 1.2 mg/dL  0.6  0.4   Alkaline Phos 38 - 126 U/L  73  63   AST 15 - 41 U/L  22  19   ALT 0 - 44 U/L  24  19       RADIOGRAPHIC STUDIES: I have personally reviewed the radiological images as listed and agreed with the findings in the report. No results found.    No orders of the defined types were placed in this encounter.  All questions were answered. The patient knows to call the clinic with any problems, questions or concerns. No barriers to learning was detected. The total time spent in the appointment was 30 minutes, including review of chart and various tests results, discussions about plan of care and coordination of care plan     Sonja [place], MD 08/29/2023

## 2023-08-29 NOTE — Assessment & Plan Note (Signed)
-  pT1bN0M0, stage IA, er+/pr+/her2-, G1 -Discovered on screening mammogram showed a 6 mm mass in the 2 o'clock position of left breast.  - She underwent lumpectomy on June 21, 2023, which showed 1 cm grade 1 invasive ductal carcinoma, margins were negative. -Oncotype DX was attempted but insufficient tissue. -She completed adjuvant radiation on Aug 25, 2023 -I recommend adjuvant AI, she will start anastrozole in early June 2025.

## 2023-08-29 NOTE — Telephone Encounter (Signed)
 I contacted Susan Christensen to discuss her genetic testing results. No pathogenic variants were identified in the 77 genes analyzed. Detailed clinic note to follow.  The test report has been scanned into EPIC and is located under the Molecular Pathology section of the Results Review tab.  A portion of the result report is included below for reference.   Lajuana Patchell, MS, [facility] Genetic Counselor [email or web address] (P) [phone]

## 2023-08-30 DIAGNOSIS — C50919 Malignant neoplasm of unspecified site of unspecified female breast: Secondary | ICD-10-CM | POA: Diagnosis not present

## 2023-08-30 DIAGNOSIS — J4540 Moderate persistent asthma, uncomplicated: Secondary | ICD-10-CM | POA: Diagnosis not present

## 2023-08-30 DIAGNOSIS — I10 Essential (primary) hypertension: Secondary | ICD-10-CM | POA: Diagnosis not present

## 2023-08-30 DIAGNOSIS — E785 Hyperlipidemia, unspecified: Secondary | ICD-10-CM | POA: Diagnosis not present

## 2023-08-30 DIAGNOSIS — E1169 Type 2 diabetes mellitus with other specified complication: Secondary | ICD-10-CM | POA: Diagnosis not present

## 2023-09-01 ENCOUNTER — Ambulatory Visit: Payer: Self-pay | Admitting: Genetic Counselor

## 2023-09-01 ENCOUNTER — Encounter: Payer: Self-pay | Admitting: Genetic Counselor

## 2023-09-01 DIAGNOSIS — Z1379 Encounter for other screening for genetic and chromosomal anomalies: Secondary | ICD-10-CM

## 2023-09-01 NOTE — Progress Notes (Signed)
 HPI:   Susan Christensen was previously seen in the [facility] due to a personal and family history of cancer and concerns regarding a hereditary predisposition to cancer. Please refer to our prior cancer genetics clinic note for more information regarding our discussion, assessment and recommendations, at the time. Susan Christensen recent genetic test results were disclosed to Susan, as were recommendations warranted by these results. These results and recommendations are discussed in more detail below.  CANCER HISTORY:  Oncology History  Carcinoma of upper-outer quadrant of left breast in female, estrogen receptor positive (HCC)  06/07/2023 Initial Diagnosis   Carcinoma of upper-outer quadrant of left breast in female, estrogen receptor positive (HCC)   07/19/2023 Cancer Staging   Staging form: Breast, AJCC 8th Edition - Pathologic: Stage IA (pT1b, pN0, cM0, G1, ER+, PR+, HER2-) - Signed by Pearlene Bouchard, PA-C on 07/19/2023 Stage prefix: Initial diagnosis Histologic grading system: 3 grade system    Genetic Testing   [facility] CancerNext-Expanded Panel+RNA was Negative. Report date is 08/28/2023.   The CancerNext-Expanded gene panel offered by [facility] and includes sequencing, rearrangement, and RNA analysis for the following 77 genes: AIP, ALK, APC, ATM, AXIN2, BAP1, BARD1, BMPR1A, BRCA1, BRCA2, BRIP1, CDC73, CDH1, CDK4, CDKN1B, CDKN2A, CEBPA, CHEK2, CTNNA1, DDX41, DICER1, ETV6, FH, FLCN, GATA2, LZTR1, MAX, MBD4, MEN1, MET, MLH1, MSH2, MSH3, MSH6, MUTYH, NF1, NF2, NTHL1, PALB2, PHOX2B, PMS2, POT1, PRKAR1A, PTCH1, PTEN, RAD51C, RAD51D, RB1, RET, RPS20, RUNX1, SDHA, SDHAF2, SDHB, SDHC, SDHD, SMAD4, SMARCA4, SMARCB1, SMARCE1, STK11, SUFU, TMEM127, TP53, TSC1, TSC2, VHL, and WT1 (sequencing and deletion/duplication); EGFR, HOXB13, KIT, MITF, PDGFRA, POLD1, and POLE (sequencing only); EPCAM and GREM1 (deletion/duplication only).      FAMILY HISTORY:  We obtained a detailed, 4-generation  family history.  Significant diagnoses are listed below:      Family History  Problem Relation Age of Onset   Heart disease Mother          has pacemaker   Heart attack Father     Heart disease Father     Hypertension Father     Breast cancer Maternal Aunt 56 - 69   Stroke Maternal Grandmother     Melanoma Cousin 29   Colon cancer Neg Hx     Colon polyps Neg Hx     Allergic rhinitis Neg Hx     Angioedema Neg Hx     Asthma Neg Hx     Atopy Neg Hx     Eczema Neg Hx     Immunodeficiency Neg Hx     Urticaria Neg Hx             Susan Christensen is unaware of previous family history of genetic testing for hereditary cancer risks. There is no reported Ashkenazi Jewish ancestry.    GENETIC TEST RESULTS:  The [facility] CancerNext-Expanded Panel found no pathogenic mutations.   The CancerNext-Expanded gene panel offered by [facility] and includes sequencing, rearrangement, and RNA analysis for the following 77 genes: AIP, ALK, APC, ATM, AXIN2, BAP1, BARD1, BMPR1A, BRCA1, BRCA2, BRIP1, CDC73, CDH1, CDK4, CDKN1B, CDKN2A, CEBPA, CHEK2, CTNNA1, DDX41, DICER1, ETV6, FH, FLCN, GATA2, LZTR1, MAX, MBD4, MEN1, MET, MLH1, MSH2, MSH3, MSH6, MUTYH, NF1, NF2, NTHL1, PALB2, PHOX2B, PMS2, POT1, PRKAR1A, PTCH1, PTEN, RAD51C, RAD51D, RB1, RET, RPS20, RUNX1, SDHA, SDHAF2, SDHB, SDHC, SDHD, SMAD4, SMARCA4, SMARCB1, SMARCE1, STK11, SUFU, TMEM127, TP53, TSC1, TSC2, VHL, and WT1 (sequencing and deletion/duplication); EGFR, HOXB13, KIT, MITF, PDGFRA, POLD1, and POLE (sequencing only); EPCAM and GREM1 (deletion/duplication  only).   The test report has been scanned into EPIC and is located under the Molecular Pathology section of the Results Review tab.  A portion of the result report is included below for reference. Genetic testing reported out on 08/28/2023.        Even though a pathogenic variant was not identified, possible explanations for the cancer in the family may include: There may be no hereditary risk for  cancer in the family. The cancers in Susan Christensen and/or Susan family may be due to other genetic or environmental factors. There may be a gene mutation in one of these genes that current testing methods cannot detect, but that chance is small. There could be another gene that has not yet been discovered, or that we have not yet tested, that is responsible for the cancer diagnoses in the family.  It is also possible there is a hereditary cause for the cancer in the family that Susan Christensen did not inherit.  Therefore, it is important to remain in touch with cancer genetics in the future so that we can continue to offer Susan Christensen the most up to date genetic testing.   ADDITIONAL GENETIC TESTING:  We discussed with Susan Christensen that Susan genetic testing was fairly extensive.  If there are genes identified to increase cancer risk that can be analyzed in the future, we would be happy to discuss and coordinate this testing at that time.    CANCER SCREENING RECOMMENDATIONS:  Susan Christensen test result is considered negative (normal). This means that we have not identified a hereditary cause for Susan personal and family history of cancer at this time.   An individual's cancer risk and medical management are not determined by genetic test results alone. Overall cancer risk assessment incorporates additional factors, including personal medical history, family history, and any available genetic information that may result in a personalized plan for cancer prevention and surveillance. Therefore, it is recommended she continue to follow the cancer management and screening guidelines provided by Susan oncology and primary healthcare provider.  RECOMMENDATIONS FOR FAMILY MEMBERS:   Since she did not inherit a mutation in a cancer predisposition gene included on this panel, Susan Christensen could not have inherited a mutation from Susan in one of these genes. Individuals in this family might be at some increased risk of developing  cancer, over the general population risk, due to the family history of cancer. We recommend women in this family have a yearly mammogram beginning at age 10, or 33 years younger than the earliest onset of cancer, an annual clinical breast exam, and perform monthly breast self-exams.  FOLLOW-UP:  Cancer genetics is a rapidly advancing field and it is possible that new genetic tests will be appropriate for Susan and/or Susan family members in the future. We encouraged Susan to remain in contact with cancer genetics on an annual basis so we can update Susan personal and family histories and let Susan know of advances in cancer genetics that may benefit this family.   Our contact number was provided. Susan Christensen questions were answered to Susan satisfaction, and she knows she is welcome to call us  at anytime with additional questions or concerns.   Glory Graefe, MS, [facility] Genetic Counselor [email or web address] (P) [phone]

## 2023-09-15 NOTE — Progress Notes (Addendum)
  Susan Christensen presents today for a follow-up after completing radiation to her left breast on 08/25/2023. Patient is doing well post radiation treatments Pain:None Skin: Skin is healing well, using vitamin E oil. ROM: None Lymphedema: None MedOnc F/U: February 28 2024 Other issues of note:  None  Pt reports Yes No Comments  Tamoxifen  []  []    Letrozole []  []    Anastrazole [x]  []    Mammogram []  Date:  []      Encouraged patient to come to their follow up appointments and to call the office with any questions or concerns.

## 2023-09-28 ENCOUNTER — Ambulatory Visit
Admission: RE | Admit: 2023-09-28 | Discharge: 2023-09-28 | Disposition: A | Discharge status: Home / Self Care | Source: Ambulatory Visit | Attending: Radiation Oncology | Admitting: Radiation Oncology

## 2023-10-15 ENCOUNTER — Other Ambulatory Visit: Payer: Self-pay | Admitting: Allergy & Immunology

## 2023-10-16 ENCOUNTER — Telehealth: Payer: Self-pay | Admitting: Allergy & Immunology

## 2023-10-16 NOTE — Telephone Encounter (Signed)
Called and left a message for patient to call our office back to schedule a follow up appointment for further refills.

## 2023-10-16 NOTE — Telephone Encounter (Signed)
 Pt returning call she received today, pt states you may leave a detailed message on her vm.

## 2023-10-16 NOTE — Telephone Encounter (Signed)
 I called the patient and scheduled her to be seen 11/10/23 by [name] for med refills.

## 2023-11-10 ENCOUNTER — Ambulatory Visit: Admitting: Family Medicine

## 2023-11-10 ENCOUNTER — Encounter: Payer: Self-pay | Admitting: Family Medicine

## 2023-11-10 VITALS — BP 130/70 | HR 96 | Temp 98.2°F | Ht 60.24 in | Wt 153.4 lb

## 2023-11-10 DIAGNOSIS — J4540 Moderate persistent asthma, uncomplicated: Secondary | ICD-10-CM

## 2023-11-10 DIAGNOSIS — J3089 Other allergic rhinitis: Secondary | ICD-10-CM | POA: Diagnosis not present

## 2023-11-10 DIAGNOSIS — T7800XD Anaphylactic reaction due to unspecified food, subsequent encounter: Secondary | ICD-10-CM | POA: Diagnosis not present

## 2023-11-10 DIAGNOSIS — T7800XA Anaphylactic reaction due to unspecified food, initial encounter: Secondary | ICD-10-CM | POA: Insufficient documentation

## 2023-11-10 DIAGNOSIS — J302 Other seasonal allergic rhinitis: Secondary | ICD-10-CM | POA: Diagnosis not present

## 2023-11-10 MED ORDER — BREO ELLIPTA 100-25 MCG/ACT IN AEPB: 1.0000 | INHALATION_SPRAY | Freq: Every day | RESPIRATORY_TRACT | 0 refills | Status: DC

## 2023-11-10 MED ORDER — ALBUTEROL SULFATE HFA 108 (90 BASE) MCG/ACT IN AERS: 2.0000 | INHALATION_SPRAY | RESPIRATORY_TRACT | 1 refills | Status: AC | PRN

## 2023-11-10 NOTE — Progress Notes (Signed)
 [address] [place] [address] Dept: [phone]  FOLLOW UP NOTE  Patient ID: Susan Christensen, female    DOB: 12-30-1955  Age: 68 y.o. MRN: [id number] Date of Office Visit: 11/10/2023  Assessment  Chief Complaint: Follow-up (')  HPI Susan Christensen is a 68 year old female who presents to the clinic for follow-up visit.  She was last seen in this clinic on 08/21/2022 by Dr. Iva for evaluation of asthma, obstructive lung defect, allergic rhinitis, and food allergy to crab and mahi-mahi.    At today's visit, she reports her asthma has been well-controlled with no  symptoms including shortness of breath, cough, or wheeze with activity or at rest.  She continues Breo 200-1 puff once a day and has not used albuterol  since her last visit to this clinic.    Allergic rhinitis is reported as well-controlled with only occasional sneeze as the main symptom.  She denies rhinorrhea, nasal congestion, or postnasal drainage.  She continues Flonase 2 sprays in each nostril daily and is not currently using an antihistamine or nasal saline rinses.  Her last environmental allergy testing via lab on 11/20/2019 was positive to grass pollen, tree pollen, weed pollen, ragweed pollen, dust mite, cat, dog, and cockroach.  She continues to avoid crab and mahi-mahi with no accidental ingestion or EpiPen  use since her last visit to this clinic.  She reports that she does not have an EpiPen  and she will call the clinic if she is interested in having an EpiPen .  Her current medications are listed in the chart.  Drug Allergies:  Allergies  Allergen Reactions   Effexor  [Venlafaxine ] Swelling   Shellfish Allergy Anaphylaxis    Physical Exam: BP 130/70   Pulse 96   Temp 98.2 F (36.8 C)   Ht 5' 0.24 (1.53 m)   Wt 153 lb 6 oz (69.6 kg)   SpO2 96%   BMI 29.72 kg/m    Physical Exam Vitals reviewed.  Constitutional:      Appearance: Normal appearance.  HENT:     Head:  Normocephalic and atraumatic.     Right Ear: Tympanic membrane normal.     Left Ear: Tympanic membrane normal.     Nose:     Comments: Bilateral nares normal.  Pharynx normal.  Ears normal.  Eyes normal.    Mouth/Throat:     Pharynx: Oropharynx is clear.  Eyes:     Conjunctiva/sclera: Conjunctivae normal.  Cardiovascular:     Rate and Rhythm: Normal rate and regular rhythm.     Heart sounds: Normal heart sounds. No murmur heard. Pulmonary:     Effort: Pulmonary effort is normal.     Breath sounds: Normal breath sounds.     Comments: Lungs clear to auscultation Musculoskeletal:        General: Normal range of motion.     Cervical back: Normal range of motion and neck supple.  Skin:    General: Skin is warm and dry.  Neurological:     Mental Status: She is alert and oriented to person, place, and time.  Psychiatric:        Mood and Affect: Mood normal.        Behavior: Behavior normal.        Thought Content: Thought content normal.        Judgment: Judgment normal.     Diagnostics: FVC 2.03 which is 80% of predicted value, FEV1 1.49 which is 75% of predicted value.  Spirometry indicates normal  ventilatory function  Assessment and Plan: 1. Moderate persistent asthma without complication   2. Seasonal and perennial allergic rhinitis   3. Anaphylactic shock due to food, subsequent encounter     Meds ordered this encounter  Medications   BREO ELLIPTA  100-25 MCG/ACT AEPB    Sig: Inhale 1 puff into the lungs daily.    Dispense:  60 each    Refill:  0    Patient will need to schedule and keep appointment for further refills. This is a courtesy refill.   albuterol  (VENTOLIN  HFA) 108 (90 Base) MCG/ACT inhaler    Sig: Inhale 2 puffs into the lungs every 4 (four) hours as needed for wheezing or shortness of breath.    Dispense:  18 g    Refill:  1    Do not fill please. Patient will call when needed. Thank you    Patient Instructions  Asthma Continue Breo 200-1 puff once a  day to prevent cough or wheeze Continue albuterol  2 puffs once every 4 hours if needed for cough or wheeze You may use albuterol  2 puffs 5-15 minutes before activity to decrease cough or wheeze   Allergic rhinitis Continue allergen avoidance measures directed toward pollen, pets, dust mite, and cockroach as listed below Continue Flonase 2 sprays in each nostril once a day if needed for stuffy nose  In the right nostril, point the applicator out toward the right ear. In the left nostril, point the applicator out toward the left ear Consider saline nasal rinses as needed for nasal symptoms. Use this before any medicated nasal sprays for best result Consider allergen immunotherapy if your symptoms are not well-controlled with the treatment plan as listed above  Food allergy Continue to avoid crab and [facility].  In case of an allergic reaction, take cetirizine 10 mg once every 12-24 hours, and if life-threatening symptoms occur, inject with EpiPen  0.3 mg. Call the clinic if you are interested in having an EpiPen   Call the clinic if this treatment plan is not working well for you  Follow up in 1 year or sooner if needed.  Return in about 1 year (around 11/09/2024), or if symptoms worsen or fail to improve.    Thank you for the opportunity to care for this patient.  Please do not hesitate to contact me with questions.  [name] Mutter, FNP [facility] 

## 2023-11-10 NOTE — Patient Instructions (Signed)
 Asthma Continue Breo 200-1 puff once a day to prevent cough or wheeze Continue albuterol  2 puffs once every 4 hours if needed for cough or wheeze You may use albuterol  2 puffs 5-15 minutes before activity to decrease cough or wheeze   Allergic rhinitis Continue allergen avoidance measures directed toward pollen, pets, dust mite, and cockroach as listed below Continue Flonase 2 sprays in each nostril once a day if needed for stuffy nose  In the right nostril, point the applicator out toward the right ear. In the left nostril, point the applicator out toward the left ear Consider saline nasal rinses as needed for nasal symptoms. Use this before any medicated nasal sprays for best result Consider allergen immunotherapy if your symptoms are not well-controlled with the treatment plan as listed above  Food allergy Continue to avoid crab and [facility].  In case of an allergic reaction, take cetirizine 10 mg once every 12-24 hours, and if life-threatening symptoms occur, inject with EpiPen  0.3 mg. Call the clinic if you are interested in having an EpiPen   Call the clinic if this treatment plan is not working well for you  Follow up in 1 year or sooner if needed.  Reducing Pollen Exposure The American Academy of Allergy, Asthma and Immunology suggests the following steps to reduce your exposure to pollen during allergy seasons. Do not hang sheets or clothing out to dry; pollen may collect on these items. Do not mow lawns or spend time around freshly cut grass; mowing stirs up pollen. Keep windows closed at night.  Keep car windows closed while driving. Minimize morning activities outdoors, a time when pollen counts are usually at their highest. Stay indoors as much as possible when pollen counts or humidity is high and on windy days when pollen tends to remain in the air longer. Use air conditioning when possible.  Many air conditioners have filters that trap the pollen spores. Use a HEPA room air  filter to remove pollen form the indoor air you breathe.   Control of Dust Mite Allergen Dust mites play a major role in allergic asthma and rhinitis. They occur in environments with high humidity wherever human skin is found. Dust mites absorb humidity from the atmosphere (ie, they do not drink) and feed on organic matter (including shed human and animal skin). Dust mites are a microscopic type of insect that you cannot see with the naked eye. High levels of dust mites have been detected from mattresses, pillows, carpets, upholstered furniture, bed covers, clothes, soft toys and any woven material. The principal allergen of the dust mite is found in its feces. A gram of dust may contain 1,000 mites and 250,000 fecal particles. Mite antigen is easily measured in the air during house cleaning activities. Dust mites do not bite and do not cause harm to humans, other than by triggering allergies/asthma.  Ways to decrease your exposure to dust mites in your home:  1. Encase mattresses, box springs and pillows with a mite-impermeable barrier or cover  2. Wash sheets, blankets and drapes weekly in hot water (130 F) with detergent and dry them in a dryer on the hot setting.  3. Have the room cleaned frequently with a vacuum cleaner and a damp dust-mop. For carpeting or rugs, vacuuming with a vacuum cleaner equipped with a high-efficiency particulate air (HEPA) filter. The dust mite allergic individual should not be in a room which is being cleaned and should wait 1 hour after cleaning before going into the room.  4. Do not sleep on upholstered furniture (eg, couches).  5. If possible removing carpeting, upholstered furniture and drapery from the home is ideal. Horizontal blinds should be eliminated in the rooms where the person spends the most time (bedroom, study, television room). Washable vinyl, roller-type shades are optimal.  6. Remove all non-washable stuffed toys from the bedroom. Wash stuffed  toys weekly like sheets and blankets above.  7. Reduce indoor humidity to less than 50%. Inexpensive humidity monitors can be purchased at most hardware stores. Do not use a humidifier as can make the problem worse and are not recommended.  Control of Dog or Cat Allergen Avoidance is the best way to manage a dog or cat allergy. If you have a dog or cat and are allergic to dog or cats, consider removing the dog or cat from the home. If you have a dog or cat but don't want to find it a new home, or if your family wants a pet even though someone in the household is allergic, here are some strategies that may help keep symptoms at bay:  Keep the pet out of your bedroom and restrict it to only a few rooms. Be advised that keeping the dog or cat in only one room will not limit the allergens to that room. Don't pet, hug or kiss the dog or cat; if you do, wash your hands with soap and water. High-efficiency particulate air (HEPA) cleaners run continuously in a bedroom or living room can reduce allergen levels over time. Regular use of a high-efficiency vacuum cleaner or a central vacuum can reduce allergen levels. Giving your dog or cat a bath at least once a week can reduce airborne allergen.  Control of Dog or Cat Allergen Avoidance is the best way to manage a dog or cat allergy. If you have a dog or cat and are allergic to dog or cats, consider removing the dog or cat from the home. If you have a dog or cat but don't want to find it a new home, or if your family wants a pet even though someone in the household is allergic, here are some strategies that may help keep symptoms at bay:  Keep the pet out of your bedroom and restrict it to only a few rooms. Be advised that keeping the dog or cat in only one room will not limit the allergens to that room. Don't pet, hug or kiss the dog or cat; if you do, wash your hands with soap and water. High-efficiency particulate air (HEPA) cleaners run continuously in  a bedroom or living room can reduce allergen levels over time. Regular use of a high-efficiency vacuum cleaner or a central vacuum can reduce allergen levels. Giving your dog or cat a bath at least once a week can reduce airborne allergen.

## 2023-11-21 ENCOUNTER — Encounter: Payer: Self-pay | Admitting: Nurse Practitioner

## 2023-11-21 ENCOUNTER — Inpatient Hospital Stay: Attending: Hematology | Admitting: Nurse Practitioner

## 2023-11-21 VITALS — BP 138/70 | HR 83 | Temp 98.0°F | Resp 17 | Wt 154.3 lb

## 2023-11-21 DIAGNOSIS — Z170 Estrogen receptor positive status [ER+]: Secondary | ICD-10-CM | POA: Insufficient documentation

## 2023-11-21 DIAGNOSIS — J45909 Unspecified asthma, uncomplicated: Secondary | ICD-10-CM | POA: Diagnosis not present

## 2023-11-21 DIAGNOSIS — Z79899 Other long term (current) drug therapy: Secondary | ICD-10-CM | POA: Insufficient documentation

## 2023-11-21 DIAGNOSIS — Z808 Family history of malignant neoplasm of other organs or systems: Secondary | ICD-10-CM | POA: Diagnosis not present

## 2023-11-21 DIAGNOSIS — Z87442 Personal history of urinary calculi: Secondary | ICD-10-CM | POA: Insufficient documentation

## 2023-11-21 DIAGNOSIS — M8580 Other specified disorders of bone density and structure, unspecified site: Secondary | ICD-10-CM | POA: Insufficient documentation

## 2023-11-21 DIAGNOSIS — Z823 Family history of stroke: Secondary | ICD-10-CM | POA: Insufficient documentation

## 2023-11-21 DIAGNOSIS — C50412 Malignant neoplasm of upper-outer quadrant of left female breast: Secondary | ICD-10-CM | POA: Diagnosis not present

## 2023-11-21 DIAGNOSIS — I10 Essential (primary) hypertension: Secondary | ICD-10-CM | POA: Diagnosis not present

## 2023-11-21 DIAGNOSIS — Z17411 Hormone receptor positive with human epidermal growth factor receptor 2 negative status: Secondary | ICD-10-CM | POA: Diagnosis not present

## 2023-11-21 DIAGNOSIS — Z7951 Long term (current) use of inhaled steroids: Secondary | ICD-10-CM | POA: Diagnosis not present

## 2023-11-21 DIAGNOSIS — Z803 Family history of malignant neoplasm of breast: Secondary | ICD-10-CM | POA: Diagnosis not present

## 2023-11-21 DIAGNOSIS — Z8249 Family history of ischemic heart disease and other diseases of the circulatory system: Secondary | ICD-10-CM | POA: Diagnosis not present

## 2023-11-21 DIAGNOSIS — Z79811 Long term (current) use of aromatase inhibitors: Secondary | ICD-10-CM | POA: Insufficient documentation

## 2023-11-21 MED ORDER — ANASTROZOLE 1 MG PO TABS: 1.0000 mg | ORAL_TABLET | Freq: Every day | ORAL | 2 refills | Status: AC

## 2023-11-21 NOTE — Progress Notes (Signed)
 CLINIC:  Survivorship   REASON FOR VISIT:  Routine follow-up post-treatment for a recent history of breast cancer.  BRIEF ONCOLOGIC HISTORY:  Oncology History  Carcinoma of upper-outer quadrant of left breast in female, estrogen receptor positive (HCC)  06/07/2023 Initial Diagnosis   Carcinoma of upper-outer quadrant of left breast in female, estrogen receptor positive (HCC)   07/19/2023 Cancer Staging   Staging form: Breast, AJCC 8th Edition - Pathologic: Stage IA (pT1b, pN0, cM0, G1, ER+, PR+, HER2-) - Signed by Wyatt Leeroy HERO, PA-C on 07/19/2023 Stage prefix: Initial diagnosis Histologic grading system: 3 grade system    Genetic Testing   [facility] CancerNext-Expanded Panel+RNA was Negative. Report date is 08/28/2023.   The CancerNext-Expanded gene panel offered by [facility] and includes sequencing, rearrangement, and RNA analysis for the following 77 genes: AIP, ALK, APC, ATM, AXIN2, BAP1, BARD1, BMPR1A, BRCA1, BRCA2, BRIP1, CDC73, CDH1, CDK4, CDKN1B, CDKN2A, CEBPA, CHEK2, CTNNA1, DDX41, DICER1, ETV6, FH, FLCN, GATA2, LZTR1, MAX, MBD4, MEN1, MET, MLH1, MSH2, MSH3, MSH6, MUTYH, NF1, NF2, NTHL1, PALB2, PHOX2B, PMS2, POT1, PRKAR1A, PTCH1, PTEN, RAD51C, RAD51D, RB1, RET, RPS20, RUNX1, SDHA, SDHAF2, SDHB, SDHC, SDHD, SMAD4, SMARCA4, SMARCB1, SMARCE1, STK11, SUFU, TMEM127, TP53, TSC1, TSC2, VHL, and WT1 (sequencing and deletion/duplication); EGFR, HOXB13, KIT, MITF, PDGFRA, POLD1, and POLE (sequencing only); EPCAM and GREM1 (deletion/duplication only).      INTERVAL HISTORY:  Susan Christensen presents to the Survivorship Clinic today for our initial meeting to review her survivorship care plan detailing her treatment course for breast cancer, as well as monitoring long-term side effects of that treatment, education regarding health maintenance, screening, and overall wellness and health promotion.     Overall, Susan Christensen reports feeling well. Still tired and has some residual radiation dermatitis.  Applying lotions. Works part time at [employer] and is otherwise active. Tolerating anastrozole  except stays hot but no overt hot flashes.     REVIEW OF SYSTEMS:  Review of Systems - Oncology Breast: Denies any new nodularity, masses, tenderness, nipple changes, or nipple discharge.  All other systems reviewed and negative    ONCOLOGY TREATMENT TEAM:  1. Surgeon:  Dr. Vanderbilt at [facility] 2. Medical Oncologist: Dr. Lanny  3. Radiation Oncologist: Dr. Izell    PAST MEDICAL/SURGICAL HISTORY:  Past Medical History:  Diagnosis Date   Allergy    Asthma    Cancer (HCC)    Breast cancer   Environmental and seasonal allergies    Fatty liver    GERD (gastroesophageal reflux disease)    History of kidney stones    History of radiation therapy 07/17/17- 08/11/17   40.05 Gy directed to the right breast in 15 fractions, followed by Boost 10 Gy in 5 fractions   Hypertension    Papilloma of breast    right   Pre-diabetes    Past Surgical History:  Procedure Laterality Date   BREAST BIOPSY Right 05/30/2017   Procedure: RIGHT NIPPLE BIOPSY;  Surgeon: Vanderbilt Ned, MD;  Location: [facility];  Service: General;  Laterality: Right;   BREAST BIOPSY Left 05/02/2023   path pending   BREAST BIOPSY Left 05/02/2023   US  LT BREAST BX W LOC DEV 1ST LESION IMG BX SPEC US  GUIDE 05/02/2023 [facility]   BREAST BIOPSY  06/20/2023   MM LT RADIOACTIVE SEED LOC MAMMO GUIDE 06/20/2023 [facility] MAMMOGRAPHY   BREAST LUMPECTOMY WITH RADIOACTIVE SEED LOCALIZATION Right 05/30/2017   Procedure: RIGHT BREAST LUMPECTOMY WITH RADIOACTIVE SEED LOCALIZATION x2;  Surgeon: Vanderbilt Ned, MD;  Location:  [facility];  Service: General;  Laterality: Right;   BREAST LUMPECTOMY WITH RADIOACTIVE SEED LOCALIZATION Left 06/21/2023   Procedure: LEFT BREAST SEED LUMPECTOMY;  Surgeon: Vanderbilt Ned, MD;  Location: [facility];  Service: General;  Laterality: Left;   BREAST  LUMPECTOMY WITH SENTINEL LYMPH NODE BIOPSY Right 06/14/2017   Procedure: RE-EXCISION OF RIGHT BREAST LUMPECTOMY WITH RIGHT SENTINEL LYMPH NODE BX;  Surgeon: Vanderbilt Ned, MD;  Location: [facility];  Service: General;  Laterality: Right;   CESAREAN SECTION     COLONOSCOPY  2009   Dr. Harvey: normal. Screening in 10 years   CYST REMOVAL HAND Left      ALLERGIES:  Allergies  Allergen Reactions   Effexor  [Venlafaxine ] Swelling   Shellfish Allergy Anaphylaxis     CURRENT MEDICATIONS:  Outpatient Encounter Medications as of 11/21/2023  Medication Sig   albuterol  (VENTOLIN  HFA) 108 (90 Base) MCG/ACT inhaler Inhale 2 puffs into the lungs every 4 (four) hours as needed for wheezing or shortness of breath.   amLODipine  (NORVASC ) 10 MG tablet Take 1 tablet (10 mg total) by mouth daily.   anastrozole  (ARIMIDEX ) 1 MG tablet Take 1 tablet (1 mg total) by mouth daily.   BREO ELLIPTA  100-25 MCG/ACT AEPB Inhale 1 puff into the lungs daily.   cholecalciferol (VITAMIN D) 1000 UNITS tablet Take 1,000 Units by mouth daily.   EPINEPHrine  (EPIPEN  2-PAK) 0.3 mg/0.3 mL IJ SOAJ injection Inject 0.3 mg into the muscle as needed for anaphylaxis.   fluticasone (FLONASE) 50 MCG/ACT nasal spray Place 1 spray into both nostrils daily.    olmesartan (BENICAR) 40 MG tablet Take 40 mg by mouth daily.   Propylene Glycol (SYSTANE BALANCE OP) Apply 1 drop to eye every morning. Pt may use up to 4 times daily prn   [DISCONTINUED] anastrozole  (ARIMIDEX ) 1 MG tablet Take 1 tablet (1 mg total) by mouth daily.   No facility-administered encounter medications on file as of 11/21/2023.     ONCOLOGIC FAMILY HISTORY:  Family History  Problem Relation Age of Onset   Heart disease Mother        has pacemaker   Heart attack Father    Heart disease Father    Hypertension Father    Breast cancer Maternal Aunt 39 - 69   Stroke Maternal Grandmother    Melanoma Cousin 29   Colon cancer Neg Hx    Colon polyps Neg Hx    Allergic  rhinitis Neg Hx    Angioedema Neg Hx    Asthma Neg Hx    Atopy Neg Hx    Eczema Neg Hx    Immunodeficiency Neg Hx    Urticaria Neg Hx      GENETIC COUNSELING/TESTING: Yes; negative  SOCIAL HISTORY:  DEMARI KROPP is married and lives with her spouse.  Susan Christensen is currently working part-time.  She denies any current or history of tobacco, alcohol, or illicit drug use.     PHYSICAL EXAMINATION:  Vital Signs:   Vitals:   11/21/23 1043  BP: 138/70  Pulse: 83  Resp: 17  Temp: 98 F (36.7 C)  SpO2: 99%   Filed Weights   11/21/23 1043  Weight: 154 lb 4.8 oz (70 kg)   General: Well-nourished, well-appearing female in no acute distress.   HEENT:  Sclerae anicteric.  Respiratory: breathing non-labored.  Neuro: No focal deficits. Steady gait.  Psych: Mood and affect normal and appropriate for situation.  Extremities: No edema. Skin: Warm and dry.  LABORATORY DATA:  None for this visit.  DIAGNOSTIC IMAGING:  None for this visit.      ASSESSMENT AND PLAN:  Ms.. Christensen is a pleasant 68 y.o. female with Stage I left breast invasive ductal carcinoma, ER+/PR+/HER2-, diagnosed in 05/2023, treated with lumpectomy, adjuvant radiation therapy, and anti-estrogen therapy with Anastrozole .  She presents to the Survivorship Clinic for our initial meeting and routine follow-up post-completion of treatment for breast cancer.    1. Stage I left breast cancer:  Susan Christensen is continuing to recover from definitive treatment for breast cancer. She will follow-up with her medical oncologist, Dr. Lanny in 3 months with history and physical exam per surveillance protocol.  She will continue her anti-estrogen therapy with Anastrozole . Thus far, she is tolerating well, with minimal side effects. She was instructed to make Dr. Lanny or myself aware if she begins to experience any worsening side effects of the medication and I could see her back in clinic to help manage those side effects, as needed.  Today, a comprehensive survivorship care plan and treatment summary was reviewed with the patient today detailing her breast cancer diagnosis, treatment course, potential late/long-term effects of treatment, appropriate follow-up care with recommendations for the future, and patient education resources.  A copy of this summary, along with a letter will be sent to the patient's primary care provider via mail/fax/In Basket message after today's visit.    2. Bone health:  Given Susan Christensen's age/history of breast cancer and her current treatment regimen including anti-estrogen therapy with anastrozole , she is at risk for bone demineralization.  Her last DEXA scan was 01/27/23, which showed osteopenia.   She was encouraged to take daily calcium/vitamin D as well as increase her weight-bearing activities.  She was given education on specific activities to promote bone health.  3. Cancer screening:  Due to Susan Christensen's history and her age, she should receive screening for skin cancers, colon cancer, and gynecologic cancers.  The information and recommendations are listed on the patient's comprehensive care plan/treatment summary and were reviewed in detail with the patient.    4. Health maintenance and wellness promotion: Susan Christensen was encouraged to consume 5-7 servings of fruits and vegetables per day.   She was also encouraged to engage in moderate to vigorous exercise for 30 minutes per day most days of the week.  She was instructed to limit her alcohol consumption and continue to abstain from tobacco use.     5. Support services/counseling: It is not uncommon for this period of the patient's cancer care trajectory to be one of many emotions and stressors.  We discussed an opportunity for her to participate in the next session of FYNN (Finding Your New Normal) support group series designed for patients after they have completed treatment.   Susan Christensen was encouraged to take advantage of our many other support  services programs, support groups, and/or counseling in coping with her new life as a cancer survivor after completing anti-cancer treatment.  She was offered support today through active listening and expressive supportive counseling.  She was given information regarding our available services and encouraged to contact me with any questions or for help enrolling in any of our support group/programs.    Dispo:   -Continue Anastrozole , refilled -Return to cancer center 02/2024  -Mammogram due in 04/2024, ordered today -Follow up with surgery as indicated  Orders Placed This Encounter  Procedures   MM DIAG BREAST TOMO BILATERAL    Standing Status:  Future    Expected Date:   04/18/2024    Expiration Date:   11/20/2024    Reason for Exam (SYMPTOM  OR DIAGNOSIS REQUIRED):   breast cancer    Preferred imaging location?:   [facility]    She is welcome to return back to the Survivorship Clinic at any time; no additional follow-up needed at this time. Consider referral back to survivorship as a long-term survivor for continued surveillance  A total of (30) minutes of face-to-face time was spent with this patient with greater than 50% of that time in counseling and care-coordination.   Holly Pring, NP Survivorship Program [facility] [phone]   Note: PRIMARY CARE PROVIDER Shona Norleen PEDLAR, MD [phone]

## 2023-12-22 DIAGNOSIS — E559 Vitamin D deficiency, unspecified: Secondary | ICD-10-CM | POA: Diagnosis not present

## 2023-12-22 DIAGNOSIS — E1169 Type 2 diabetes mellitus with other specified complication: Secondary | ICD-10-CM | POA: Diagnosis not present

## 2023-12-22 DIAGNOSIS — E785 Hyperlipidemia, unspecified: Secondary | ICD-10-CM | POA: Diagnosis not present

## 2023-12-28 DIAGNOSIS — I10 Essential (primary) hypertension: Secondary | ICD-10-CM | POA: Diagnosis not present

## 2023-12-28 DIAGNOSIS — C50919 Malignant neoplasm of unspecified site of unspecified female breast: Secondary | ICD-10-CM | POA: Diagnosis not present

## 2023-12-28 DIAGNOSIS — R945 Abnormal results of liver function studies: Secondary | ICD-10-CM | POA: Diagnosis not present

## 2023-12-28 DIAGNOSIS — T7800XD Anaphylactic reaction due to unspecified food, subsequent encounter: Secondary | ICD-10-CM | POA: Diagnosis not present

## 2023-12-28 DIAGNOSIS — E1165 Type 2 diabetes mellitus with hyperglycemia: Secondary | ICD-10-CM | POA: Diagnosis not present

## 2023-12-28 DIAGNOSIS — R011 Cardiac murmur, unspecified: Secondary | ICD-10-CM | POA: Diagnosis not present

## 2023-12-28 DIAGNOSIS — Z23 Encounter for immunization: Secondary | ICD-10-CM | POA: Diagnosis not present

## 2023-12-28 DIAGNOSIS — E559 Vitamin D deficiency, unspecified: Secondary | ICD-10-CM | POA: Diagnosis not present

## 2023-12-28 DIAGNOSIS — E785 Hyperlipidemia, unspecified: Secondary | ICD-10-CM | POA: Diagnosis not present

## 2023-12-28 DIAGNOSIS — J4540 Moderate persistent asthma, uncomplicated: Secondary | ICD-10-CM | POA: Diagnosis not present

## 2023-12-28 DIAGNOSIS — M8580 Other specified disorders of bone density and structure, unspecified site: Secondary | ICD-10-CM | POA: Diagnosis not present

## 2023-12-28 DIAGNOSIS — G720 Drug-induced myopathy: Secondary | ICD-10-CM | POA: Diagnosis not present

## 2023-12-29 MED ORDER — BREO ELLIPTA 100-25 MCG/ACT IN AEPB: 1.0000 | INHALATION_SPRAY | Freq: Every morning | RESPIRATORY_TRACT | 3 refills | Status: DC

## 2023-12-29 NOTE — Addendum Note (Signed)
 Addended by: FRANCIS ROULEAU A on: 12/29/2023 01:40 PM   Modules accepted: Orders

## 2024-01-29 ENCOUNTER — Other Ambulatory Visit: Payer: Self-pay | Admitting: Allergy & Immunology

## 2024-02-28 ENCOUNTER — Other Ambulatory Visit: Payer: Self-pay

## 2024-02-28 ENCOUNTER — Inpatient Hospital Stay: Admitting: Hematology

## 2024-02-28 ENCOUNTER — Inpatient Hospital Stay: Attending: Hematology

## 2024-02-28 VITALS — BP 129/67 | HR 79 | Temp 97.6°F | Resp 17 | Ht 60.24 in | Wt 153.3 lb

## 2024-02-28 DIAGNOSIS — Z87442 Personal history of urinary calculi: Secondary | ICD-10-CM | POA: Insufficient documentation

## 2024-02-28 DIAGNOSIS — M85852 Other specified disorders of bone density and structure, left thigh: Secondary | ICD-10-CM | POA: Insufficient documentation

## 2024-02-28 DIAGNOSIS — Z79899 Other long term (current) drug therapy: Secondary | ICD-10-CM | POA: Insufficient documentation

## 2024-02-28 DIAGNOSIS — Z86018 Personal history of other benign neoplasm: Secondary | ICD-10-CM | POA: Diagnosis not present

## 2024-02-28 DIAGNOSIS — Z79811 Long term (current) use of aromatase inhibitors: Secondary | ICD-10-CM | POA: Diagnosis not present

## 2024-02-28 DIAGNOSIS — J45909 Unspecified asthma, uncomplicated: Secondary | ICD-10-CM | POA: Insufficient documentation

## 2024-02-28 DIAGNOSIS — R232 Flushing: Secondary | ICD-10-CM | POA: Diagnosis not present

## 2024-02-28 DIAGNOSIS — C50412 Malignant neoplasm of upper-outer quadrant of left female breast: Secondary | ICD-10-CM

## 2024-02-28 DIAGNOSIS — Z923 Personal history of irradiation: Secondary | ICD-10-CM | POA: Diagnosis not present

## 2024-02-28 DIAGNOSIS — I10 Essential (primary) hypertension: Secondary | ICD-10-CM | POA: Diagnosis not present

## 2024-02-28 DIAGNOSIS — Z170 Estrogen receptor positive status [ER+]: Secondary | ICD-10-CM

## 2024-02-28 DIAGNOSIS — Z1721 Progesterone receptor positive status: Secondary | ICD-10-CM | POA: Insufficient documentation

## 2024-02-28 DIAGNOSIS — Z1732 Human epidermal growth factor receptor 2 negative status: Secondary | ICD-10-CM | POA: Insufficient documentation

## 2024-02-28 DIAGNOSIS — K760 Fatty (change of) liver, not elsewhere classified: Secondary | ICD-10-CM | POA: Diagnosis not present

## 2024-02-28 LAB — CMP (CANCER CENTER ONLY)
ALT: 52 U/L — ABNORMAL HIGH (ref 0–44)
AST: 41 U/L (ref 15–41)
Albumin: 4.6 g/dL (ref 3.5–5.0)
Alkaline Phosphatase: 96 U/L (ref 38–126)
Anion gap: 13 (ref 5–15)
BUN: 16 mg/dL (ref 8–23)
CO2: 28 mmol/L (ref 22–32)
Calcium: 9.8 mg/dL (ref 8.9–10.3)
Chloride: 100 mmol/L (ref 98–111)
Creatinine: 1.04 mg/dL — ABNORMAL HIGH (ref 0.44–1.00)
GFR, Estimated: 58 mL/min — ABNORMAL LOW (ref >60)
Glucose, Bld: 183 mg/dL — ABNORMAL HIGH (ref 70–99)
Potassium: 4.2 mmol/L (ref 3.5–5.1)
Sodium: 140 mmol/L (ref 135–145)
Total Bilirubin: 0.6 mg/dL (ref 0.0–1.2)
Total Protein: 7.6 g/dL (ref 6.5–8.1)

## 2024-02-28 LAB — CBC WITH DIFFERENTIAL (CANCER CENTER ONLY)
Abs Immature Granulocytes: 0.04 K/uL (ref 0.00–0.07)
Basophils Absolute: 0.1 K/uL (ref 0.0–0.1)
Basophils Relative: 1 %
Eosinophils Absolute: 0.3 K/uL (ref 0.0–0.5)
Eosinophils Relative: 4 %
HCT: 42 % (ref 36.0–46.0)
Hemoglobin: 14.3 g/dL (ref 12.0–15.0)
Immature Granulocytes: 1 %
Lymphocytes Relative: 19 %
Lymphs Abs: 1.6 K/uL (ref 0.7–4.0)
MCH: 30.6 pg (ref 26.0–34.0)
MCHC: 34 g/dL (ref 30.0–36.0)
MCV: 89.9 fL (ref 80.0–100.0)
Monocytes Absolute: 0.8 K/uL (ref 0.1–1.0)
Monocytes Relative: 9 %
Neutro Abs: 5.6 K/uL (ref 1.7–7.7)
Neutrophils Relative %: 66 %
Platelet Count: 293 K/uL (ref 150–400)
RBC: 4.67 MIL/uL (ref 3.87–5.11)
RDW: 13.2 % (ref 11.5–15.5)
WBC Count: 8.3 K/uL (ref 4.0–10.5)
nRBC: 0 % (ref 0.0–0.2)

## 2024-02-28 NOTE — Assessment & Plan Note (Signed)
-  pT1bN0M0, stage IA, er+/pr+/her2-, G1 -Discovered on screening mammogram showed a 6 mm mass in the 2 o'clock position of left breast.  - She underwent lumpectomy on June 21, 2023, which showed 1 cm grade 1 invasive ductal carcinoma, margins were negative. -Oncotype DX was attempted but insufficient tissue. -She completed adjuvant radiation on Aug 25, 2023 -I recommend adjuvant AI, she started anastrozole  in early June 2025.

## 2024-02-28 NOTE — Progress Notes (Signed)
 [name]   [phone] Fax:([phone]) [phone]   Clinic Follow up Note   Patient Care Team: Shona Norleen PEDLAR, MD as PCP - General (Internal Medicine) Vanderbilt Ned, MD as Consulting Physician (General Surgery) Izell Domino, MD as Attending Physician (Radiation Oncology) Darroll Anes, DO as Referring Physician (Optometry) Lanny Callander, MD as Consulting Physician (Hematology) Tyree Nanetta SAILOR, RN as Oncology Nurse Navigator Burton, Lacie K, NP as Nurse Practitioner (Nurse Practitioner)  Date of Service:  02/28/2024  CHIEF COMPLAINT: f/u of left breast cancer  CURRENT THERAPY:  Adjuvant anastrozole   Oncology History   Carcinoma of upper-outer quadrant of left breast in female, estrogen receptor positive (HCC) -pT1bN0M0, stage IA, er+/pr+/her2-, G1 -Discovered on screening mammogram showed a 6 mm mass in the 2 o'clock position of left breast.  - She underwent lumpectomy on June 21, 2023, which showed 1 cm grade 1 invasive ductal carcinoma, margins were negative. -Oncotype DX was attempted but insufficient tissue. -She completed adjuvant radiation on Aug 25, 2023 -I recommend adjuvant AI, she started anastrozole  in early June 2025.  Assessment & Plan Breast cancer, status post treatment, on anastrozole  Breast cancer status post treatment, currently on anastrozole . No new concerns or symptoms. No pain or discomfort at the previous surgical site. Blood counts are normal. Continues to experience mild hot flashes without sweating. No significant side effects from anastrozole . Left breast remains firm due to radiation, but no new masses or abnormalities on examination. - Continue anastrozole  for at least five years - Monitor thyroid function and bone density - Ordered mammogram for January 2026 - Scheduled follow-up in six months  Osteopenia of left hip Osteopenia in the left hip with the lowest bone density. Risk of fracture is not extremely high. Currently  taking calcium and vitamin D supplements. - Continue calcium and vitamin D supplementation - Encouraged weight-bearing exercise  Plan - She is clinically doing well, lab reviewed, exam was unremarkable, no clinical concern for recurrence - She is tolerating anastrozole  well, will continue - Lab and follow-up with NP in 6 months   SUMMARY OF ONCOLOGIC HISTORY: Oncology History  Carcinoma of upper-outer quadrant of left breast in female, estrogen receptor positive (HCC)  06/07/2023 Initial Diagnosis   Carcinoma of upper-outer quadrant of left breast in female, estrogen receptor positive (HCC)   07/19/2023 Cancer Staging   Staging form: Breast, AJCC 8th Edition - Pathologic: Stage IA (pT1b, pN0, cM0, G1, ER+, PR+, HER2-) - Signed by Wyatt Leeroy HERO, PA-C on 07/19/2023 Stage prefix: Initial diagnosis Histologic grading system: 3 grade system    Genetic Testing   [facility] CancerNext-Expanded Panel+RNA was Negative. Report date is 08/28/2023.   The CancerNext-Expanded gene panel offered by [facility] and includes sequencing, rearrangement, and RNA analysis for the following 77 genes: AIP, ALK, APC, ATM, AXIN2, BAP1, BARD1, BMPR1A, BRCA1, BRCA2, BRIP1, CDC73, CDH1, CDK4, CDKN1B, CDKN2A, CEBPA, CHEK2, CTNNA1, DDX41, DICER1, ETV6, FH, FLCN, GATA2, LZTR1, MAX, MBD4, MEN1, MET, MLH1, MSH2, MSH3, MSH6, MUTYH, NF1, NF2, NTHL1, PALB2, PHOX2B, PMS2, POT1, PRKAR1A, PTCH1, PTEN, RAD51C, RAD51D, RB1, RET, RPS20, RUNX1, SDHA, SDHAF2, SDHB, SDHC, SDHD, SMAD4, SMARCA4, SMARCB1, SMARCE1, STK11, SUFU, TMEM127, TP53, TSC1, TSC2, VHL, and WT1 (sequencing and deletion/duplication); EGFR, HOXB13, KIT, MITF, PDGFRA, POLD1, and POLE (sequencing only); EPCAM and GREM1 (deletion/duplication only).       Discussed the use of AI scribe software for clinical note transcription with the patient, who gave verbal consent to proceed.  History of Present Illness Susan Christensen is a  68 year old female with breast  cancer who presents for follow-up to review lab and ultrasound results.  She is on anastrozole  for breast cancer and experiences persistent hot flashes without sweating. Sleep is generally unaffected. There are no new concerns regarding her breast or previous surgical site, with no pain, shooting discomfort, or swelling. She has undergone physical therapy post-surgery and notes firmness in her left breast. She continues to perform self-breast exams and is due for her next mammogram in January.     All other systems were reviewed with the patient and are negative.  MEDICAL HISTORY:  Past Medical History:  Diagnosis Date   Allergy    Asthma    Cancer (HCC)    Breast cancer   Environmental and seasonal allergies    Fatty liver    GERD (gastroesophageal reflux disease)    History of kidney stones    History of radiation therapy 07/17/17- 08/11/17   40.05 Gy directed to the right breast in 15 fractions, followed by Boost 10 Gy in 5 fractions   Hypertension    Papilloma of breast    right   Pre-diabetes     SURGICAL HISTORY: Past Surgical History:  Procedure Laterality Date   BREAST BIOPSY Right 05/30/2017   Procedure: RIGHT NIPPLE BIOPSY;  Surgeon: Vanderbilt Ned, MD;  Location: [facility];  Service: General;  Laterality: Right;   BREAST BIOPSY Left 05/02/2023   path pending   BREAST BIOPSY Left 05/02/2023   US  LT BREAST BX W LOC DEV 1ST LESION IMG BX SPEC US  GUIDE 05/02/2023 [facility]   BREAST BIOPSY  06/20/2023   MM LT RADIOACTIVE SEED LOC MAMMO GUIDE 06/20/2023 [facility] MAMMOGRAP[facility]   BREAST LUMPECTOMY WITH RADIOACTIVE SEED LOCALIZATION Right 05/30/2017   Procedure: RIGHT BREAST LUMPECTOMY WITH RADIOACTIVE SEED LOCALIZATION x2;  Surgeon: Vanderbilt Ned, MD;  Location: [facility];  Service: General;  Laterality: Right;   BREAST LUMPECTOMY WITH RADIOACTIVE SEED LOCALIZATION Left 06/21/2023   Procedure: LEFT BREAST SEED LUMPECTOMY;  Surgeon: Vanderbilt Ned, MD;  Location: [facility];  Service: General;  Laterality: Left;   BREAST LUMPECTOMY WITH SENTINEL LYMPH NODE BIOPSY Right 06/14/2017   Procedure: RE-EXCISION OF RIGHT BREAST LUMPECTOMY WITH RIGHT SENTINEL LYMPH NODE BX;  Surgeon: Vanderbilt Ned, MD;  Location: [facility];  Service: General;  Laterality: Right;   CESAREAN SECTION     COLONOSCOPY  2009   Dr. Harvey: normal. Screening in 10 years   CYST REMOVAL HAND Left     I have reviewed the social history and family history with the patient and they are unchanged from previous note.  ALLERGIES:  is allergic to effexor  [venlafaxine ] and shellfish allergy.  MEDICATIONS:  Current Outpatient Medications  Medication Sig Dispense Refill   albuterol  (VENTOLIN  HFA) 108 (90 Base) MCG/ACT inhaler Inhale 2 puffs into the lungs every 4 (four) hours as needed for wheezing or shortness of breath. 18 g 1   amLODipine  (NORVASC ) 10 MG tablet Take 1 tablet (10 mg total) by mouth daily. 90 tablet 1   anastrozole  (ARIMIDEX ) 1 MG tablet Take 1 tablet (1 mg total) by mouth daily. 90 tablet 2   BREO ELLIPTA  100-25 MCG/ACT AEPB INHALE 1 PUFF INTO THE LUNGS DAILY 60 each 3   cholecalciferol (VITAMIN D) 1000 UNITS tablet Take 1,000 Units by mouth daily.     EPINEPHrine  (EPIPEN  2-PAK) 0.3 mg/0.3 mL IJ SOAJ injection Inject 0.3 mg into the muscle as needed for anaphylaxis. 2 each 1  fluticasone (FLONASE) 50 MCG/ACT nasal spray Place 1 spray into both nostrils daily.      olmesartan (BENICAR) 40 MG tablet Take 40 mg by mouth daily.     Propylene Glycol (SYSTANE BALANCE OP) Apply 1 drop to eye every morning. Pt may use up to 4 times daily prn     No current facility-administered medications for this visit.    PHYSICAL EXAMINATION: ECOG PERFORMANCE STATUS: 0 - Asymptomatic  Vitals:   02/28/24 1500 02/28/24 1507  BP: (!) 143/67 129/67  Pulse: 82 79  Resp: 17   Temp: 97.6 F (36.4 C)   SpO2: 96% 98%   Wt Readings from Last 3 Encounters:   02/28/24 153 lb 4.8 oz (69.5 kg)  11/21/23 154 lb 4.8 oz (70 kg)  11/10/23 153 lb 6 oz (69.6 kg)     GENERAL:alert, no distress and comfortable SKIN: skin color, texture, turgor are normal, no rashes or significant lesions EYES: normal, Conjunctiva are pink and non-injected, sclera clear NECK: supple, thyroid normal size, non-tender, without nodularity LYMPH:  no palpable lymphadenopathy in the cervical, axillary  LUNGS: clear to auscultation and percussion with normal breathing effort HEART: regular rate & rhythm and no murmurs and no lower extremity edema ABDOMEN:abdomen soft, non-tender and normal bowel sounds Musculoskeletal:no cyanosis of digits and no clubbing  NEURO: alert & oriented x 3 with fluent speech, no focal motor/sensory deficits BREAST: b/l breast incision healed well. Left breast firm due to radiation.  No palpable breast mass or adenopathy. Physical Exam   LABORATORY DATA:  I have reviewed the data as listed    Latest Ref Rng & Units 02/28/2024    2:51 PM 05/26/2020    2:56 PM 11/20/2019    1:35 PM  CBC  WBC 4.0 - 10.5 K/uL 8.3  10.5  12.1   Hemoglobin 12.0 - 15.0 g/dL 85.6  85.3  84.9   Hematocrit 36.0 - 46.0 % 42.0  43.6  45.3   Platelets 150 - 400 K/uL 293  271  303         Latest Ref Rng & Units 02/28/2024    2:51 PM 06/16/2023    2:30 PM 05/26/2020    2:56 PM  CMP  Glucose 70 - 99 mg/dL 816  94  96   BUN 8 - 23 mg/dL 16  17  11    Creatinine 0.44 - 1.00 mg/dL 8.95  9.16  9.14   Sodium 135 - 145 mmol/L 140  140  141   Potassium 3.5 - 5.1 mmol/L 4.2  4.4  4.1   Chloride 98 - 111 mmol/L 100  106  105   CO2 22 - 32 mmol/L 28  25  25    Calcium 8.9 - 10.3 mg/dL 9.8  9.4  9.2   Total Protein 6.5 - 8.1 g/dL 7.6   7.6   Total Bilirubin 0.0 - 1.2 mg/dL 0.6   0.6   Alkaline Phos 38 - 126 U/L 96   73   AST 15 - 41 U/L 41   22   ALT 0 - 44 U/L 52   24       RADIOGRAPHIC STUDIES: I have personally reviewed the radiological images as listed and agreed  with the findings in the report. No results found.    Orders Placed This Encounter  Procedures   MM 3D DIAGNOSTIC MAMMOGRAM BILATERAL BREAST    Standing Status:   Future    Expected Date:   04/20/2024    Expiration  Date:   02/27/2025    Reason for Exam (SYMPTOM  OR DIAGNOSIS REQUIRED):   SCREENIGN    Preferred imaging location?:   [facility]   All questions were answered. The patient knows to call the clinic with any problems, questions or concerns. No barriers to learning was detected. The total time spent in the appointment was 25 minutes, including review of chart and various tests results, discussions about plan of care and coordination of care plan     Onita Mattock, MD 02/28/2024

## 2024-05-13 ENCOUNTER — Other Ambulatory Visit: Payer: Self-pay | Admitting: Family Medicine

## 2024-08-27 ENCOUNTER — Inpatient Hospital Stay: Attending: Hematology

## 2024-08-27 ENCOUNTER — Inpatient Hospital Stay: Admitting: Hematology

## 2024-11-13 ENCOUNTER — Ambulatory Visit: Admitting: Allergy & Immunology
